# Patient Record
Sex: Male | Born: 1940 | Race: Black or African American | Hispanic: No | Marital: Married | Smoking: Former smoker
Health system: Southern US, Community
[De-identification: ages and names within clinical notes are randomized; demographics above are authoritative.]

## PROBLEM LIST (undated history)

## (undated) DIAGNOSIS — R059 Cough, unspecified: Secondary | ICD-10-CM

## (undated) DIAGNOSIS — N186 End stage renal disease: Secondary | ICD-10-CM

## (undated) DIAGNOSIS — I2510 Atherosclerotic heart disease of native coronary artery without angina pectoris: Secondary | ICD-10-CM

## (undated) DIAGNOSIS — R0602 Shortness of breath: Secondary | ICD-10-CM

## (undated) DIAGNOSIS — R42 Dizziness and giddiness: Secondary | ICD-10-CM

## (undated) DIAGNOSIS — R195 Other fecal abnormalities: Secondary | ICD-10-CM

## (undated) DIAGNOSIS — D649 Anemia, unspecified: Secondary | ICD-10-CM

## (undated) DIAGNOSIS — K2970 Gastritis, unspecified, without bleeding: Secondary | ICD-10-CM

## (undated) DIAGNOSIS — R05 Cough: Secondary | ICD-10-CM

## (undated) DIAGNOSIS — R531 Weakness: Secondary | ICD-10-CM

## (undated) DIAGNOSIS — G4700 Insomnia, unspecified: Secondary | ICD-10-CM

## (undated) DIAGNOSIS — I10 Essential (primary) hypertension: Secondary | ICD-10-CM

## (undated) DIAGNOSIS — I219 Acute myocardial infarction, unspecified: Secondary | ICD-10-CM

---

## 1998-03-31 ENCOUNTER — Other Ambulatory Visit: Admission: RE | Admit: 1998-03-31 | Discharge: 1998-03-31 | Payer: Self-pay | Admitting: *Deleted

## 1998-04-16 ENCOUNTER — Other Ambulatory Visit: Admission: RE | Admit: 1998-04-16 | Discharge: 1998-04-16 | Payer: Self-pay | Admitting: *Deleted

## 1998-05-07 ENCOUNTER — Other Ambulatory Visit: Admission: RE | Admit: 1998-05-07 | Discharge: 1998-05-07 | Payer: Self-pay | Admitting: *Deleted

## 1998-05-07 ENCOUNTER — Inpatient Hospital Stay (HOSPITAL_COMMUNITY): Admission: EM | Admit: 1998-05-07 | Discharge: 1998-05-13 | Payer: Self-pay | Admitting: Emergency Medicine

## 1998-05-25 ENCOUNTER — Inpatient Hospital Stay (HOSPITAL_COMMUNITY): Admission: EM | Admit: 1998-05-25 | Discharge: 1998-06-08 | Payer: Self-pay | Admitting: Emergency Medicine

## 1998-06-13 ENCOUNTER — Other Ambulatory Visit: Admission: RE | Admit: 1998-06-13 | Discharge: 1998-06-13 | Payer: Self-pay | Admitting: *Deleted

## 1998-09-04 ENCOUNTER — Ambulatory Visit (HOSPITAL_COMMUNITY): Admission: RE | Admit: 1998-09-04 | Discharge: 1998-09-04 | Payer: Self-pay | Admitting: Vascular Surgery

## 1999-02-05 ENCOUNTER — Ambulatory Visit (HOSPITAL_COMMUNITY): Admission: RE | Admit: 1999-02-05 | Discharge: 1999-02-05 | Payer: Self-pay | Admitting: Vascular Surgery

## 1999-03-24 ENCOUNTER — Ambulatory Visit (HOSPITAL_COMMUNITY): Admission: RE | Admit: 1999-03-24 | Discharge: 1999-03-24 | Payer: Self-pay | Admitting: *Deleted

## 1999-06-12 ENCOUNTER — Ambulatory Visit (HOSPITAL_COMMUNITY): Admission: RE | Admit: 1999-06-12 | Discharge: 1999-06-12 | Payer: Self-pay | Admitting: Thoracic Surgery

## 1999-06-13 ENCOUNTER — Inpatient Hospital Stay (HOSPITAL_COMMUNITY): Admission: AD | Admit: 1999-06-13 | Discharge: 1999-06-13 | Payer: Self-pay | Admitting: Thoracic Surgery

## 1999-06-13 ENCOUNTER — Encounter: Payer: Self-pay | Admitting: Thoracic Surgery

## 1999-08-18 ENCOUNTER — Encounter: Payer: Self-pay | Admitting: Vascular Surgery

## 1999-08-18 ENCOUNTER — Ambulatory Visit (HOSPITAL_COMMUNITY): Admission: RE | Admit: 1999-08-18 | Discharge: 1999-08-18 | Payer: Self-pay | Admitting: Vascular Surgery

## 2000-01-14 ENCOUNTER — Encounter: Admission: RE | Admit: 2000-01-14 | Discharge: 2000-02-29 | Payer: Self-pay | Admitting: Sports Medicine

## 2001-07-11 ENCOUNTER — Ambulatory Visit (HOSPITAL_COMMUNITY): Admission: RE | Admit: 2001-07-11 | Discharge: 2001-07-12 | Payer: Self-pay | Admitting: Ophthalmology

## 2001-07-11 ENCOUNTER — Encounter: Payer: Self-pay | Admitting: Ophthalmology

## 2001-08-12 ENCOUNTER — Ambulatory Visit (HOSPITAL_COMMUNITY): Admission: RE | Admit: 2001-08-12 | Discharge: 2001-08-12 | Payer: Self-pay | Admitting: *Deleted

## 2001-08-24 ENCOUNTER — Ambulatory Visit (HOSPITAL_COMMUNITY): Admission: RE | Admit: 2001-08-24 | Discharge: 2001-08-24 | Payer: Self-pay | Admitting: *Deleted

## 2001-08-24 ENCOUNTER — Encounter: Payer: Self-pay | Admitting: Vascular Surgery

## 2002-09-11 ENCOUNTER — Encounter: Admission: RE | Admit: 2002-09-11 | Discharge: 2002-09-11 | Payer: Self-pay | Admitting: Nephrology

## 2002-09-11 ENCOUNTER — Encounter: Payer: Self-pay | Admitting: Nephrology

## 2002-11-27 ENCOUNTER — Encounter: Payer: Self-pay | Admitting: Orthopaedic Surgery

## 2002-11-27 ENCOUNTER — Encounter: Admission: RE | Admit: 2002-11-27 | Discharge: 2002-11-27 | Payer: Self-pay | Admitting: Orthopaedic Surgery

## 2002-12-11 ENCOUNTER — Encounter: Admission: RE | Admit: 2002-12-11 | Discharge: 2002-12-11 | Payer: Self-pay | Admitting: Orthopaedic Surgery

## 2002-12-11 ENCOUNTER — Encounter: Payer: Self-pay | Admitting: Orthopaedic Surgery

## 2003-04-13 ENCOUNTER — Ambulatory Visit (HOSPITAL_COMMUNITY): Admission: AD | Admit: 2003-04-13 | Discharge: 2003-04-13 | Payer: Self-pay | Admitting: Vascular Surgery

## 2003-04-13 ENCOUNTER — Encounter: Payer: Self-pay | Admitting: Vascular Surgery

## 2003-08-26 ENCOUNTER — Encounter: Payer: Self-pay | Admitting: Vascular Surgery

## 2003-08-26 ENCOUNTER — Ambulatory Visit (HOSPITAL_COMMUNITY): Admission: RE | Admit: 2003-08-26 | Discharge: 2003-08-26 | Payer: Self-pay | Admitting: Vascular Surgery

## 2003-10-17 ENCOUNTER — Ambulatory Visit (HOSPITAL_COMMUNITY): Admission: RE | Admit: 2003-10-17 | Discharge: 2003-10-17 | Payer: Self-pay | Admitting: Nephrology

## 2004-01-26 ENCOUNTER — Emergency Department (HOSPITAL_COMMUNITY): Admission: EM | Admit: 2004-01-26 | Discharge: 2004-01-26 | Payer: Self-pay | Admitting: Emergency Medicine

## 2004-02-06 ENCOUNTER — Ambulatory Visit (HOSPITAL_COMMUNITY): Admission: RE | Admit: 2004-02-06 | Discharge: 2004-02-06 | Payer: Self-pay | Admitting: Nephrology

## 2005-03-11 ENCOUNTER — Ambulatory Visit: Payer: Self-pay | Admitting: Cardiology

## 2006-10-19 ENCOUNTER — Encounter: Admission: RE | Admit: 2006-10-19 | Discharge: 2006-10-19 | Payer: Self-pay | Admitting: Nephrology

## 2007-09-01 ENCOUNTER — Emergency Department (HOSPITAL_COMMUNITY): Admission: EM | Admit: 2007-09-01 | Discharge: 2007-09-01 | Payer: Self-pay | Admitting: Emergency Medicine

## 2008-02-27 ENCOUNTER — Emergency Department (HOSPITAL_COMMUNITY): Admission: EM | Admit: 2008-02-27 | Discharge: 2008-02-28 | Payer: Self-pay | Admitting: Family Medicine

## 2009-01-07 ENCOUNTER — Ambulatory Visit (HOSPITAL_COMMUNITY): Admission: RE | Admit: 2009-01-07 | Discharge: 2009-01-07 | Payer: Self-pay | Admitting: Nephrology

## 2009-04-11 ENCOUNTER — Ambulatory Visit (HOSPITAL_COMMUNITY): Admission: RE | Admit: 2009-04-11 | Discharge: 2009-04-11 | Payer: Self-pay | Admitting: Nephrology

## 2009-04-28 ENCOUNTER — Ambulatory Visit (HOSPITAL_COMMUNITY): Admission: RE | Admit: 2009-04-28 | Discharge: 2009-04-28 | Payer: Self-pay | Admitting: Nephrology

## 2009-08-11 ENCOUNTER — Ambulatory Visit (HOSPITAL_COMMUNITY): Admission: RE | Admit: 2009-08-11 | Discharge: 2009-08-11 | Payer: Self-pay | Admitting: Nephrology

## 2009-11-18 ENCOUNTER — Ambulatory Visit (HOSPITAL_COMMUNITY): Admission: RE | Admit: 2009-11-18 | Discharge: 2009-11-18 | Payer: Self-pay | Admitting: Nephrology

## 2010-02-09 ENCOUNTER — Ambulatory Visit (HOSPITAL_COMMUNITY): Admission: RE | Admit: 2010-02-09 | Discharge: 2010-02-09 | Payer: Self-pay | Admitting: Nephrology

## 2010-02-11 ENCOUNTER — Ambulatory Visit (HOSPITAL_COMMUNITY): Admission: RE | Admit: 2010-02-11 | Discharge: 2010-02-11 | Payer: Self-pay | Admitting: Vascular Surgery

## 2010-02-11 ENCOUNTER — Ambulatory Visit: Payer: Self-pay | Admitting: Vascular Surgery

## 2010-03-26 ENCOUNTER — Ambulatory Visit (HOSPITAL_COMMUNITY): Admission: RE | Admit: 2010-03-26 | Discharge: 2010-03-26 | Payer: Self-pay

## 2010-03-26 ENCOUNTER — Ambulatory Visit: Payer: Self-pay | Admitting: Surgery

## 2010-07-21 ENCOUNTER — Inpatient Hospital Stay (HOSPITAL_COMMUNITY): Admission: EM | Admit: 2010-07-21 | Discharge: 2010-07-24 | Payer: Self-pay | Admitting: *Deleted

## 2010-07-23 ENCOUNTER — Ambulatory Visit: Payer: Self-pay | Admitting: Surgery

## 2010-07-23 ENCOUNTER — Encounter (INDEPENDENT_AMBULATORY_CARE_PROVIDER_SITE_OTHER): Payer: Self-pay | Admitting: Internal Medicine

## 2010-07-24 ENCOUNTER — Ambulatory Visit: Payer: Self-pay | Admitting: Surgery

## 2011-01-17 ENCOUNTER — Encounter: Payer: Self-pay | Admitting: Nephrology

## 2011-03-13 LAB — GLUCOSE, CAPILLARY
Glucose-Capillary: 122 mg/dL — ABNORMAL HIGH (ref 70–99)
Glucose-Capillary: 81 mg/dL (ref 70–99)

## 2011-03-13 LAB — CBC
HCT: 31.9 % — ABNORMAL LOW (ref 39.0–52.0)
Hemoglobin: 10.5 g/dL — ABNORMAL LOW (ref 13.0–17.0)
Hemoglobin: 11 g/dL — ABNORMAL LOW (ref 13.0–17.0)
Hemoglobin: 11.9 g/dL — ABNORMAL LOW (ref 13.0–17.0)
MCH: 29 pg (ref 26.0–34.0)
MCHC: 32.8 g/dL (ref 30.0–36.0)
MCV: 88.4 fL (ref 78.0–100.0)
Platelets: 97 10*3/uL — ABNORMAL LOW (ref 150–400)
RBC: 3.81 MIL/uL — ABNORMAL LOW (ref 4.22–5.81)
RBC: 4.13 MIL/uL — ABNORMAL LOW (ref 4.22–5.81)
WBC: 6.7 10*3/uL (ref 4.0–10.5)
WBC: 7.8 10*3/uL (ref 4.0–10.5)

## 2011-03-13 LAB — COMPREHENSIVE METABOLIC PANEL
ALT: 48 U/L (ref 0–53)
AST: 89 U/L — ABNORMAL HIGH (ref 0–37)
CO2: 29 mEq/L (ref 19–32)
Chloride: 94 mEq/L — ABNORMAL LOW (ref 96–112)
GFR calc Af Amer: 7 mL/min — ABNORMAL LOW (ref >60)
GFR calc non Af Amer: 6 mL/min — ABNORMAL LOW (ref >60)
Sodium: 136 mEq/L (ref 135–145)
Total Bilirubin: 1.4 mg/dL — ABNORMAL HIGH (ref 0.3–1.2)

## 2011-03-13 LAB — T4, FREE: Free T4: 0.97 ng/dL (ref 0.80–1.80)

## 2011-03-13 LAB — BASIC METABOLIC PANEL
CO2: 29 mEq/L (ref 19–32)
Calcium: 8.4 mg/dL (ref 8.4–10.5)
Glucose, Bld: 128 mg/dL — ABNORMAL HIGH (ref 70–99)
Potassium: 3.7 mEq/L (ref 3.5–5.1)
Sodium: 135 mEq/L (ref 135–145)

## 2011-03-13 LAB — CULTURE, BLOOD (ROUTINE X 2)

## 2011-03-13 LAB — DIFFERENTIAL
Eosinophils Relative: 4 % (ref 0–5)
Lymphocytes Relative: 12 % (ref 12–46)
Lymphs Abs: 0.9 10*3/uL (ref 0.7–4.0)

## 2011-03-13 LAB — LACTIC ACID, PLASMA: Lactic Acid, Venous: 1.2 mmol/L (ref 0.5–2.2)

## 2011-03-13 LAB — POCT I-STAT, CHEM 8
BUN: 22 mg/dL (ref 6–23)
Creatinine, Ser: 8.1 mg/dL — ABNORMAL HIGH (ref 0.4–1.5)
Potassium: 3.2 mEq/L — ABNORMAL LOW (ref 3.5–5.1)
Sodium: 136 mEq/L (ref 135–145)
TCO2: 30 mmol/L (ref 0–100)

## 2011-03-19 LAB — POCT I-STAT 4, (NA,K, GLUC, HGB,HCT)
Glucose, Bld: 85 mg/dL (ref 70–99)
HCT: 36 % — ABNORMAL LOW (ref 39.0–52.0)
Hemoglobin: 12.2 g/dL — ABNORMAL LOW (ref 13.0–17.0)

## 2011-04-06 LAB — POTASSIUM: Potassium: 3.6 mEq/L (ref 3.5–5.1)

## 2011-04-07 LAB — POTASSIUM: Potassium: 3.8 mEq/L (ref 3.5–5.1)

## 2011-05-07 ENCOUNTER — Emergency Department (HOSPITAL_COMMUNITY): Payer: Medicare Other

## 2011-05-07 ENCOUNTER — Inpatient Hospital Stay (HOSPITAL_COMMUNITY): Payer: Medicare Other

## 2011-05-07 ENCOUNTER — Inpatient Hospital Stay (HOSPITAL_COMMUNITY)
Admission: EM | Admit: 2011-05-07 | Discharge: 2011-05-10 | DRG: 864 | Disposition: A | Discharge status: Home / Self Care | Payer: Medicare Other | Attending: Internal Medicine | Admitting: Internal Medicine

## 2011-05-07 DIAGNOSIS — D649 Anemia, unspecified: Secondary | ICD-10-CM | POA: Diagnosis present

## 2011-05-07 DIAGNOSIS — I120 Hypertensive chronic kidney disease with stage 5 chronic kidney disease or end stage renal disease: Secondary | ICD-10-CM | POA: Diagnosis present

## 2011-05-07 DIAGNOSIS — N186 End stage renal disease: Secondary | ICD-10-CM | POA: Diagnosis present

## 2011-05-07 DIAGNOSIS — R509 Fever, unspecified: Principal | ICD-10-CM | POA: Diagnosis present

## 2011-05-07 DIAGNOSIS — I2510 Atherosclerotic heart disease of native coronary artery without angina pectoris: Secondary | ICD-10-CM | POA: Diagnosis present

## 2011-05-07 DIAGNOSIS — N2581 Secondary hyperparathyroidism of renal origin: Secondary | ICD-10-CM | POA: Diagnosis present

## 2011-05-07 DIAGNOSIS — Z7982 Long term (current) use of aspirin: Secondary | ICD-10-CM

## 2011-05-07 DIAGNOSIS — N390 Urinary tract infection, site not specified: Secondary | ICD-10-CM | POA: Diagnosis present

## 2011-05-07 LAB — COMPREHENSIVE METABOLIC PANEL
Alkaline Phosphatase: 79 U/L (ref 39–117)
BUN: 45 mg/dL — ABNORMAL HIGH (ref 6–23)
GFR calc non Af Amer: 5 mL/min — ABNORMAL LOW (ref >60)
Glucose, Bld: 87 mg/dL (ref 70–99)
Potassium: 4.3 mEq/L (ref 3.5–5.1)
Total Protein: 7.7 g/dL (ref 6.0–8.3)

## 2011-05-07 LAB — DIFFERENTIAL
Eosinophils Relative: 0 % (ref 0–5)
Lymphocytes Relative: 14 % (ref 12–46)
Lymphs Abs: 1.1 10*3/uL (ref 0.7–4.0)
Monocytes Absolute: 0.7 10*3/uL (ref 0.1–1.0)

## 2011-05-07 LAB — CBC
HCT: 39.5 % (ref 39.0–52.0)
MCHC: 32.7 g/dL (ref 30.0–36.0)
MCV: 91 fL (ref 78.0–100.0)
RDW: 13.8 % (ref 11.5–15.5)
WBC: 8.2 10*3/uL (ref 4.0–10.5)

## 2011-05-07 LAB — TSH: TSH: 2.395 u[IU]/mL (ref 0.350–4.500)

## 2011-05-07 LAB — CK TOTAL AND CKMB (NOT AT ARMC): CK, MB: 1.9 ng/mL (ref 0.3–4.0)

## 2011-05-07 LAB — LIPASE, BLOOD: Lipase: 14 U/L (ref 11–59)

## 2011-05-07 LAB — PROTIME-INR: Prothrombin Time: 14.8 seconds (ref 11.6–15.2)

## 2011-05-08 LAB — COMPREHENSIVE METABOLIC PANEL
Albumin: 3 g/dL — ABNORMAL LOW (ref 3.5–5.2)
Albumin: 3 g/dL — ABNORMAL LOW (ref 3.5–5.2)
Alkaline Phosphatase: 68 U/L (ref 39–117)
BUN: 17 mg/dL (ref 6–23)
BUN: 22 mg/dL (ref 6–23)
Calcium: 9.2 mg/dL (ref 8.4–10.5)
Calcium: 9.2 mg/dL (ref 8.4–10.5)
Creatinine, Ser: 5.13 mg/dL — ABNORMAL HIGH (ref 0.4–1.5)
Glucose, Bld: 117 mg/dL — ABNORMAL HIGH (ref 70–99)
Potassium: 3.9 mEq/L (ref 3.5–5.1)
Sodium: 136 mEq/L (ref 135–145)
Total Protein: 7.1 g/dL (ref 6.0–8.3)
Total Protein: 6.9 g/dL (ref 6.0–8.3)

## 2011-05-08 LAB — CBC
HCT: 38.5 % — ABNORMAL LOW (ref 39.0–52.0)
HCT: 36.2 % — ABNORMAL LOW (ref 39.0–52.0)
MCH: 29.1 pg (ref 26.0–34.0)
MCHC: 32.5 g/dL (ref 30.0–36.0)
MCV: 89.5 fL (ref 78.0–100.0)
MCV: 89.8 fL (ref 78.0–100.0)
Platelets: 110 10*3/uL — ABNORMAL LOW (ref 150–400)
RBC: 4.03 MIL/uL — ABNORMAL LOW (ref 4.22–5.81)
RDW: 13.7 % (ref 11.5–15.5)
WBC: 6.1 10*3/uL (ref 4.0–10.5)

## 2011-05-08 LAB — LIPID PANEL
Cholesterol: 140 mg/dL (ref 0–200)
HDL: 44 mg/dL (ref >39)
Total CHOL/HDL Ratio: 3.2 RATIO
Triglycerides: 134 mg/dL (ref <150)
VLDL: 27 mg/dL (ref 0–40)

## 2011-05-08 LAB — URINALYSIS, MICROSCOPIC ONLY
Glucose, UA: NEGATIVE mg/dL
Ketones, ur: 15 mg/dL — AB
Nitrite: NEGATIVE
Protein, ur: >300 mg/dL — AB
Urobilinogen, UA: 0.2 mg/dL (ref 0.0–1.0)

## 2011-05-08 LAB — GLUCOSE, CAPILLARY
Glucose-Capillary: 79 mg/dL (ref 70–99)
Glucose-Capillary: 90 mg/dL (ref 70–99)
Glucose-Capillary: 100 mg/dL — ABNORMAL HIGH (ref 70–99)
Glucose-Capillary: 109 mg/dL — ABNORMAL HIGH (ref 70–99)

## 2011-05-08 LAB — CARDIAC PANEL(CRET KIN+CKTOT+MB+TROPI)
CK, MB: 1.8 ng/mL (ref 0.3–4.0)
Relative Index: 0.2 (ref 0.0–2.5)
Total CK: 871 U/L — ABNORMAL HIGH (ref 7–232)
Total CK: 824 U/L — ABNORMAL HIGH (ref 7–232)

## 2011-05-08 LAB — MRSA PCR SCREENING: MRSA by PCR: NEGATIVE

## 2011-05-08 NOTE — H&P (Signed)
Ronald Brewer, Ronald Brewer                 ACCOUNT NO.:  [id number]  MEDICAL RECORD NO.:  [id number]           PATIENT TYPE:  E  LOCATION:  [facility]                         FACILITY:  [facility]  PHYSICIAN:  Marinda Elk, M.D.DATE OF BIRTH:  [birth year]  DATE OF ADMISSION:  05/07/2011 DATE OF DISCHARGE:                             HISTORY & PHYSICAL   PRIMARY CARE DOCTOR:  James L. Deterding, MD  VASCULAR SURGEON: 1. Durene Cal IV, MD  CHIEF COMPLAINT:  Nausea, vomiting and fevers.  This is a 70 year old male with past medical history of end-stage renal disease, currently on dialysis Monday, Wednesday, and Friday with also past medical history on July 25, 2010 of MSSA bacteremia probable grafts source.  That comes in for fever and malaise.  The patient has been complaining of nausea and vomiting for the past 2 days.  As per his wife, he has no appetite, has not eaten anything, only drank minimally, has not taking his blood pressure medication.  Stated that yesterday he fell on his hip, this was secondarly felling a little dizzy at that time. Denies any chest pain, back pain, urinary symptoms, hematemesis.  He relates some fevers and chills and has been progressively getting worse. He relays he does not have an appetite and continues to have this bad feeling all over his body. Has vomitted several times.  ALLERGIES:  No known drug allergies.  PAST MEDICAL HISTORY: 1. MSSA bacteremia. 2. End-stage renal disease. 3. Status post left subclavian vein angioplasty done in July 2011. 4. Hypertension.  MEDICATIONS: 1. Amlodipine 10 mg p.o. daily. 2. Aspirin 81 mg daily. 3. Cefazolin 1 g Monday, Wednesday and Friday. 4. Micardis 80 mg daily. 5. PhosLo 667 mg p.o. t.i.d.  SOCIAL HISTORY:  He lives with his wife at home in [place], retired. Denies alcohol, tobacco or drugs.  FAMILY HISTORY:  Mother had hypertension.  His father is unknown.  REVIEW OF SYSTEMS:  Ten-point review of  system done, pertinent positive per HPI.  PHYSICAL EXAMINATION:  VITAL SIGNS:  Temperature 100.8, pulse of 84, blood pressure 106/60, heart breathing 27 per minute, satting 97% on room air. GENERAL:  He is lying in bed, looks tired appearing. HEENT:  Dry mucous membrane.  No JVD.  No bruits.  No thyromegaly. Anicteric.  No pallor. CARDIOVASCULAR:  He has a regular rate and rhythm with a positive S1 and S2.  No murmurs, rubs, or gallops. LUNGS:  He has good air movement, clear to auscultation. ABDOMEN:  He has positive bowel sounds, suprapubic tenderness, nondistended.  No rebound. SKIN: No rashes or ulceration. NECK:  No nephropathy, nonpalpable. NEUROLOGIC:  Nonfocal.  Laboratory data on admission shows a lipase of 14.  His sodium is 138, potassium 4.3, chloride 95, bicarb of 30, glucose of 87, BUN of 45, creatinine 10.50, bilirubin 0.4, alkaline phosphatase 79, AST 18, ALT 8, total protein 7.7, albumin 3.4, calcium of 9.9.  His white count of 8.2, ANC of 6.2, hemoglobin of 12.9 with a platelet count of 132, MCV of 91. His chest x-ray shows no acute cardiopulmonary disease.  EKG shows left axis deviation.  No T-wave inversions with a normal sinus rhythm.  ASSESSMENT AND PLAN: 1. Fever, malaise, nausea and vomiting, anorexia, concern for bacteremia.     He does have a history for this last year.  In July 2011, he had an     MSSA bacteremia.  At this time, we will him admit to step-down due     to his blood pressure being soft.  We will get blood cultures x2.     We will start him on vancomycin and Zosyn.  Have already consulted     Renal.  Once blood cultures back, we will start him with broad-     spectrum antibiotics. Concern for graft infection. Also due to his      history of HTN and smoking will cycle his cardiac markers. 2. End-stage renal disease per Renal Monday, Wednesday, and Friday. 3. Anemia per Renal. 4. Hypertension.  We will hold all his blood pressure medications.   He     usually runs high, his blood pressure is borderline.  At this time,     he is not taking his blood pressure medication for 2 days.  I will     give IV fluids, admit him to the step-down and monitor him closely.     Marinda Elk, M.D.     AF/MEDQ  D:  05/07/2011  T:  05/07/2011  Job:  [id number]  cc:   Mindi Slicker. Lowell Guitar, M.D.  Electronically Signed by Marinda Elk M.D. on 05/08/2011 07:09:26 AM

## 2011-05-09 LAB — CBC
HCT: 34.8 % — ABNORMAL LOW (ref 39.0–52.0)
Hemoglobin: 11.4 g/dL — ABNORMAL LOW (ref 13.0–17.0)
MCV: 89.5 fL (ref 78.0–100.0)
RBC: 3.89 MIL/uL — ABNORMAL LOW (ref 4.22–5.81)
RDW: 13.7 % (ref 11.5–15.5)
WBC: 5.2 10*3/uL (ref 4.0–10.5)

## 2011-05-09 LAB — GLUCOSE, CAPILLARY
Glucose-Capillary: 85 mg/dL (ref 70–99)
Glucose-Capillary: 105 mg/dL — ABNORMAL HIGH (ref 70–99)
Glucose-Capillary: 107 mg/dL — ABNORMAL HIGH (ref 70–99)

## 2011-05-09 LAB — RENAL FUNCTION PANEL
BUN: 37 mg/dL — ABNORMAL HIGH (ref 6–23)
Chloride: 91 mEq/L — ABNORMAL LOW (ref 96–112)
Glucose, Bld: 81 mg/dL (ref 70–99)
Phosphorus: 2.9 mg/dL (ref 2.3–4.6)
Potassium: 4.1 mEq/L (ref 3.5–5.1)
Sodium: 132 mEq/L — ABNORMAL LOW (ref 135–145)

## 2011-05-10 ENCOUNTER — Inpatient Hospital Stay (HOSPITAL_COMMUNITY): Payer: Medicare Other

## 2011-05-10 LAB — GLUCOSE, CAPILLARY: Glucose-Capillary: 64 mg/dL — ABNORMAL LOW (ref 70–99)

## 2011-05-10 LAB — URINE CULTURE
Colony Count: 10000
Culture  Setup Time: 201205130021

## 2011-05-11 NOTE — Discharge Summary (Signed)
  NAMEALASTAIR, Ronald Brewer                 ACCOUNT NO.:  [id number]  MEDICAL RECORD NO.:  [id number]           PATIENT TYPE:  I  LOCATION:  6715                         FACILITY:  [facility]  PHYSICIAN:  Jonny Ruiz, MD    DATE OF BIRTH:  12/30/1940  DATE OF ADMISSION:  05/07/2011 DATE OF DISCHARGE:                        DISCHARGE SUMMARY - REFERRING   PRIMARY CARE PHYSICIAN:  Dr. Fayrene Fearing Deterding.  VASCULAR SURGEON: 1. Durene Cal IV, MD.  REASON FOR HOSPITALIZATION:  Nausea and vomiting and fever.  LAB DATA:  Cardiac enzymes negative.  CBC with WBC 8.2, hemoglobin 12.9, hematocrit 39.5, platelet count 132.  Comprehensive metabolic panel 138, potassium 4.3, chloride 95, CO2 30, glucose 87, BUN 45, creatinine 10.50.  Liver enzymes normal.  Calcium 9.9 repeated.  Renal function 1 day prior to discharge, BUN 37, creatinine 8.0.  The patient underwent hemodialysis today.  Urinalysis showed large blood with greater than 300 protein and 11-20 wbc's with unremarkable urine culture.  Repeated cardiac enzymes showed a total CK of 824 with normal CK-MB and troponin. Blood cultures were negative x2.  Lipid profile, total cholesterol 140, triglycerides 134, HDL 44, LDL 69.  MRSA PCR screening was negative. TSH 2.395, magnesium 2.3, lipase 14.  FINAL DISCHARGE DIAGNOSIS:  Viral febrile illness.  SECONDARY DIAGNOSES:  End-stage renal disease, on hemodialysis, status post left subclavian angioplasty in July 2011, hypertension, and MSSA bacteremia.  Patient underwent chest x-ray which was normal.  Hip x-ray which was normal.  Abdominal 2-view x-ray, no acute abnormality.  PROCEDURES AND TREATMENT:  The patient was admitted to the hospital on May 06, 2010, and was initially managed with medications for nausea and vomiting including Zofran.  He wants empirically started on vanc and Zosyn.  And after consultation with Nephrology, the patient was discontinued on antibiotics and he was observed.  The  patient's temperature normalized and he became afebrile without nausea and vomiting.  Today, the patient underwent hemodialysis and he was only noted to have elevated blood pressure which responded to amlodipine 5 mg a day.  Because of abnormal UA, the patient will be discharged home on ciprofloxacin 500 mg every 24 hours on 5 days.  DISCHARGE CONDITION:  Stable.  INSTRUCTIONS:  The patient will be discharged on his previous home regimen.  His diet is a renal diet.  Physical activity without restrictions.  MEDICATIONS: 1. Amlodipine 10 mg a day. 2. Aspirin 81 mg a day. 3. Cefazolin 1 g on Monday, Wednesday, and Friday. 4. Micardis 80 mg a day. 5. PhosLo 667 mg p.o. 3 times a day.  FOLLOW-UP:  Follow up with Dr. Darrick Penna in 2 weeks.          ______________________________ Jonny Ruiz, MD     GL/MEDQ  D:  05/10/2011  T:  05/10/2011  Job:  [id number]  Electronically Signed by Jonny Ruiz MD on 05/11/2011 02:45:24 PM

## 2011-05-13 LAB — CULTURE, BLOOD (ROUTINE X 2)
Culture  Setup Time: 201205111831
Culture: NO GROWTH

## 2011-05-14 NOTE — Op Note (Signed)
[place]  Patient:    Ronald Brewer, Ronald Brewer                          MRN: [id number] Proc. Date: 08/12/01 Adm. Date:  [id number] Disc. Date: [id number] Attending:  Caralee Ates.                           Operative Report  PREOPERATIVE DIAGNOSIS:  End-stage renal disease with thrombosed left forearm arteriovenous graft.  POSTOPERATIVE DIAGNOSIS:  End-stage renal disease with thrombosed left forearm arteriovenous graft.  PROCEDURE:  Thrombectomy of left forearm arteriovenous graft.  SURGEON:  Caralee Ates, M.D.  ANESTHESIA:  MAC plus 1% lidocaine with local.  ESTIMATED BLOOD LOSS:  25 cc.  DRAINS:  None.  SPECIMENS:  None.  COMPLICATIONS:  None.  BRIEF HISTORY:  This patient is a 70 year old black gentleman with end-stage renal disease, who is on hemodialysis through a left forearm AV graft.  This graft has thrombosed previously and has since been revised to the brachial vein above the level of the elbow.  He thrombosed today and is in need of thrombectomy or possible revision for his dialysis needs.  The risks, benefits, and alternatives to the procedure were explained in detail to the patient, and he agreed to proceed.  DESCRIPTION OF PROCEDURE:  The patient was brought in the operating room and placed on the operating table in supine position with the left arm extended on an arm board.  Following adequate IV sedation, the left arm was draped and prepped circumferentially from the axilla to the hand.  A longitudinal incision was made following local anesthesia with 1% lidocaine on the medial aspect of the left upper arm through a previous incision.  The wound was deepened using the Bovie to control bleeding.  The venous limb of the access graft was identified at this level and dissected circumferentially and controlled with a vessel loop.  The outflow tract was further dissected to the level of the brachial vein, where the brachial vein was  dissected circumferentially and controlled with a vessel loop.  The patient was systemically heparinized.  Following adequate three-minute circulation time, a transverse graftotomy was made in the distalmost portion of the graft, and the #4 Fogarty catheter was passed out the venous outflow tract and thrombectomized with the return of fresh thrombus.  There was minimal resistance to the balloon during thrombectomy, indicating there was not a significant stenosis at this level.  Serial dilatation with coronary dilators up to a #5 coronary dilator was performed without significant resistance to passage of the dilator.  At this point we felt there was not a significant outflow stenosis.  The vein was then backbled and then flushed with heparin and saline solution easily.  The venous outflow tract was then occluded.  The arterial limb was then thrombectomized with the return of excellent inflow following the removal of the fresh clot.  At this point the transverse graftotomy was closed with running 5-0 Prolene suture and the clamps were released, restoring flow.  There was excellent thrill in the graft at this point.  Hemostasis was achieved in the wound.  Then the wound was irrigated with warm sterile saline.  The wound was then closed in layers of 3-0 and 4-0 Vicryl suture.  Sterile dry dressings were applied.  The patient was then awakened from anesthesia and transferred to the recovery room in stable condition.  The patient tolerated the procedure well.  There were no complications.  All needle and sponge counts were correct. DD:  08/12/01 TD:  08/14/01 Job: [id number]

## 2011-05-14 NOTE — Op Note (Signed)
NAME:  Ronald Brewer, Ronald Brewer                           ACCOUNT NO.:  [id number]   MEDICAL RECORD NO.:  [id number]                   PATIENT TYPE:  OIB   LOCATION:  2899                                 FACILITY:  [facility]   PHYSICIAN:  Di Kindle. Edilia Bo, M.D.        DATE OF BIRTH:  1941-04-17   DATE OF PROCEDURE:  08/26/2003  DATE OF DISCHARGE:                                 OPERATIVE REPORT   PREOPERATIVE DIAGNOSIS:  Chronic renal failure with clotted left forearm  arteriovenous graft.   POSTOPERATIVE DIAGNOSIS:  Chronic renal failure with clotted left forearm  arteriovenous graft.   OPERATION/PROCEDURE:  1. Attempted thrombectomy of left forearm arteriovenous graft.  2. Insertion of new left upper arm arteriovenous graft.  3. Ultrasound of bilateral internal jugular veins.  4. Placement of a right IJ Diatek catheter.   SURGEON:  Di Kindle. Edilia Bo, M.D.   ASSISTANT:  Nurse.   ANESTHESIA:  Local with sedation.   TECHNIQUE:  The patient was taken to the operating room and was sedated by  anesthesia.  The left upper extremity was prepped and draped in the usual  sterile fashion.  The incision  over the previous venous anastomosis was  anesthetized and then opened and through dense scar tissue, I dissected out  the venous limb of the graft.  Of note, the graft was very degenerative and  the vein itself had become quite sclerotic.  This graft had been revised  multiple times and had been extended fairly high up the brachial vein.  I  therefore decided to abandon this graft and place a new upper arm graft.  The graft was oversewn at both ends.  Hemostasis was obtained in this wound.  A separate longitudinal incision was made about the antecubital space where  the brachial artery was dissected free.  This was a good sized artery with  an excellent pulse.  A separate longitudinal incision was made beneath the  axilla after the skin was anesthetized and the high brachial vein was  dissected free.  A 4-7 mm graft was then tunneled between the two incisions.  The patient was then heparinized.  The brachial artery was clamped  proximally and distally and a longitudinal arteriotomy was made.  A segment  of the 4 mm end of the graft was excised, the graft slightly spatulated and  sewn end-to-side to the brachial artery using continuous 6-0 Prolene suture.  The graft was then pulled to the appropriate length for an anastomosis to  the high brachial vein.  The vein was ligated distally and spatulated  proximally.  The graft was cut to the appropriate length spatulated and sewn  end-to-end to the vein using a continuous 6-0 Prolene suture.  At the  completion, there was an excellent thrill in the graft.  The heparin was  reversed with Protamine.  Hemostasis was obtained in the wounds.  The wounds  were closed with a deep layer  of 3-0 Vicryl, the skin closed with 4-0  Vicryl.  Sterile dressing was applied.   Next, attention was turned to placement of a Diatek catheter.  Both internal  jugular veins were identified with the ultrasound scanner and both appeared  to be patent.  The neck and upper chest were then prepped and draped in the  usual sterile fashion.  After the skin was anesthetized with 1% lidocaine,  the right internal jugular vein was cannulated and a guide wire introduced  into the superior vena cava under fluoroscopic control.  The tract was then  dilated and then a dilator and peel-away sheath were passed over the wire  and the wire and dilator removed.  The catheter was passed through the peel-  away sheath and positioned in the right atrium.  The exit site for the  catheter was selected and the skin anesthetized between the two areas.  The  catheter was then brought through the tunnel and cut to the appropriate  length and the distal ports were attached.  Both ports withdrew easily, were  then flushed with heparinized saline and filled with concentrated  heparin.  The catheter was secured at its exit site with a 3-0 nylon suture.  The IJ  cannulation site was closed with a 4-0 subcuticular stitch.  Sterile  dressing was applied.  The patient tolerated the procedure well, was  transferred to the recovery room in satisfactory condition.  All needle and  sponge counts were correct.                                                Di Kindle. Edilia Bo, M.D.    CSD/MEDQ  D:  08/26/2003  T:  08/26/2003  Job:  [id number]

## 2011-05-14 NOTE — Op Note (Signed)
[place]  Patient:    Ronald Brewer, Ronald Brewer Visit Number: [id number] MRN: [id number]          Service Type: DSU Location: [facility] 2876 [id number] Attending Physician:  Caralee Ates Dictated by:   Di Kindle Edilia Bo, M.D. Proc. Date: 08/24/01 Admit Date:  08/24/2001 Discharge Date: 08/24/2001                             Operative Report  PREOPERATIVE DIAGNOSES: 1. Chronic renal failure. 2. Clotted left forearm arteriovenous graft.  POSTOPERATIVE DIAGNOSES: 1. Chronic renal failure. 2. Clotted left forearm arteriovenous graft.  PROCEDURES: 1. Thrombectomy of left forearm arteriovenous graft. 2. Insertion of new segment of graft to more proximal brachial vein. 3. Intraoperative arteriogram x 2.  SURGEON:  Di Kindle. Edilia Bo, M.D.  ASSISTANT:  Nurse.  ANESTHESIA:  Local with sedation.  DESCRIPTION OF PROCEDURE:  The patient was taken to the operating room and sedated by anesthesia.  The left upper extremity was prepped and draped in the usual sterile fashion.  After the skin was infiltrated with 1% lidocaine, the incision over the venous anastomosis was opened and through dense scar tissue, the more proximal brachial vein was dissected free and the old venous anastomosis was excised.  The vein was large, although somewhat irregular.  I tried to get up high above the intimal hyperplasia.  Graft thrombectomy was performed using a #4 Fogarty catheter.  There were no problems in pulling the catheter through the body of the graft.  The arterial plug was retrieved and excellent inflow established.  The graft was flushed with heparinized saline and clamped.  A new segment of 7 mm graft was brought to the field and sewn end-to-end to the spatulated brachial vein using continuous 6-0 Prolene suture.  The graft was then pulled to the appropriate length for anastomosis to the old graft.  This was done end-to-end using continuous 6-0 Prolene suture.  At the  completion there was an excellent thrill in the graft.  Two intraoperative fistulograms were obtained, and no problems were identified. Hemostasis was obtained in the wounds.  The patients heparin was partially reversed with protamine.  The patient did require 20 mcg of DDAVP.  The wound was then closed with a deep layer of 3-0 Vicryl and the skin closed with 4-0 Vicryl.  A sterile dressing was applied.  The patient tolerated the procedure well and was transferred to the recovery room in satisfactory condition.  All needle and sponge counts were correct. Dictated by:   Di Kindle Edilia Bo, M.D. Attending Physician:  Caralee Ates. DD:  08/24/01 TD:  08/25/01 Job: [id number]

## 2011-05-14 NOTE — Op Note (Signed)
[place]. [facility]  Patient:    Ronald Brewer, Ronald Brewer                          MRN: [id number] Proc. Date: 07/11/01 Adm. Date:  [id number] Disc. Date: [id number] Attending:  Bertrum Sol                           Operative Report  DATE OF BIRTH:  [birth year].  ADMISSION DIAGNOSES:  Traction retinal detachment, vitreous hemorrhage, proliferative diabetic retinopathy, dense cataract, all in the left eye.  PROCEDURES:  Pars plana vitrectomy, membrane peel, repair of retinal detachment, retinal photocoagulation, and gas-fluid exchange, left eye.  SURGEON:  Beulah Gandy. Ashley Royalty, M.D.  ASSISTANT:  Lu Duffel, C.O.A., S.A.  ANESTHESIA:  General.  DESCRIPTION OF PROCEDURE:  Usual prep and drape.  Peritomies at 10, 2, and 4. A 4 mm angled infusion port anchored in place at 4 oclock.  Contact lens ring anchored in place at 6 and 12 oclock.  The lighted pick and the cutter were placed at 10 and 2 oclock, respectively.  The pars plana vitrectomy was begun just behind the dense cataract.  Large amounts of blood and vitreous were encountered.  These were carefully removed under low suction and rapid cutting.  Once the blood was cleared away, the disc became apparent and there was severe traction retinal detachment extending from the disc superiorly, inferiorly, nasally, and temporally.  The membrane was engaged with a lighted pick and freed from its attachment to the disc.  The membrane was peeled peripherally along the upper and lower arcades.  The MPC scissors were used to section different areas of the fibrovascular attachment.  The posterior hyaloid was removed for 360 degrees with the vitreous cutter.  The endocautery was used to cauterize several areas of vascular bleeding.  Once all of the areas of traction were relieved, the retina was allowed to lie flat.  Blood was vacuumed from the posterior segment of the eye with the [employer] brush.  The  Endolaser was positioned in the eye, and 944 burns were placed around the retinal periphery with a power of 400 milliwatts, 1000 microns each, and 0.1 seconds each.  A gas-fluid exchange was carried out.  The instruments were removed from the eye.  Nylon 9-0 was used to close the sclerotomy sites.  The conjunctiva was closed with wet-field cautery. Polymyxin and gentamicin were irrigated into Tenons space, atropine solution was applied, Decadron 10 mg was injected into the lower subconjunctival space. Marcaine was injected around the globe for postop pain.  Polysporin, a patch and shield were placed.  The patient was awakened and taken to recovery in a satisfactory condition. DD:  07/11/01 TD:  07/12/01 Job: [id number]

## 2011-05-14 NOTE — H&P (Signed)
   NAME:  Ronald Brewer, Ronald Brewer                          ACCOUNT NO.:  [id number]   MEDICAL RECORD NO.:  [id number]                   PATIENT TYPE:  INP   LOCATION:                                       FACILITY:  [facility]   PHYSICIAN:  Quita Skye. Hart Rochester, M.D.               DATE OF BIRTH:  07/06/1941   DATE OF ADMISSION:  04/13/2003  DATE OF DISCHARGE:                                HISTORY & PHYSICAL   PREOPERATIVE DIAGNOSIS:  Thrombosed AV cortex graft, left forearm.   POSTOPERATIVE DIAGNOSIS:  Thrombosed AV cortex graft, left forearm.   OPERATION:  Thrombectomy AV cortex graft, left forearm with insertion new  segment of graft, existing graft to brachial vein with 6 mm cortex.   SURGEON:  Quita Skye. Hart Rochester, M.D.   FIRST ASSISTANT:  Nurse.   ANESTHESIA:  Local.   PROCEDURE:  Patient was taken to the operating room, placed in a supine  position at which time, left upper extremity was prepped with Betadine scrub  and solution, draped in a routine sterile manner.  After infiltration with  1% Xylocaine with epinephrine, a longitudinal incision was made through the  distal left arm through the previous scar.  The cortex of the brachial vein  anastomosis is dissected free with a #15 blade, 3,000 units of heparin given  intravenously.  The graft was opened transversely and easily  thrombectomized.  __________ inflow being reestablished.  There was some  intimal hyperplasia involving the distal 2 cm of the graft.  The proximal  vein was 5 mm in size and a Fogarty would easily traverse it into the right  atrium.  There was some thrombus in the vein which was thrombectomized.  The  previous anastomosis was excised, a new piece of 6 mm graft anastomoses end-  to-end with the old graft and end-to-end with the vein with 6-0 Prolene.  Clamps were then released.  There was excellent pulse and thrill in the  graft.  Protamine 30 mg was given to reverse the heparin.  Following this,  the wound was closed  with Vicryl in a subcuticular fashion.  A sterile  dressing was applied.  Patient was taken to the recovery room in  satisfactory condition.                                               Quita Skye Hart Rochester, M.D.    JDL/MEDQ  D:  04/13/2003  T:  04/13/2003  Job:  [id number]

## 2011-09-06 ENCOUNTER — Other Ambulatory Visit (HOSPITAL_COMMUNITY): Payer: Self-pay | Admitting: Nephrology

## 2011-09-06 ENCOUNTER — Ambulatory Visit (HOSPITAL_COMMUNITY)
Admission: RE | Admit: 2011-09-06 | Discharge: 2011-09-06 | Disposition: A | Discharge status: Home / Self Care | Payer: Medicare Other | Source: Ambulatory Visit | Attending: Nephrology | Admitting: Nephrology

## 2011-09-06 DIAGNOSIS — M19019 Primary osteoarthritis, unspecified shoulder: Secondary | ICD-10-CM | POA: Insufficient documentation

## 2011-09-06 DIAGNOSIS — T82898A Other specified complication of vascular prosthetic devices, implants and grafts, initial encounter: Secondary | ICD-10-CM | POA: Insufficient documentation

## 2011-09-06 DIAGNOSIS — N186 End stage renal disease: Secondary | ICD-10-CM | POA: Insufficient documentation

## 2011-09-06 DIAGNOSIS — D649 Anemia, unspecified: Secondary | ICD-10-CM | POA: Insufficient documentation

## 2011-09-06 DIAGNOSIS — I120 Hypertensive chronic kidney disease with stage 5 chronic kidney disease or end stage renal disease: Secondary | ICD-10-CM | POA: Insufficient documentation

## 2011-09-06 DIAGNOSIS — I2510 Atherosclerotic heart disease of native coronary artery without angina pectoris: Secondary | ICD-10-CM | POA: Insufficient documentation

## 2011-09-06 DIAGNOSIS — I252 Old myocardial infarction: Secondary | ICD-10-CM | POA: Insufficient documentation

## 2011-09-06 DIAGNOSIS — Y832 Surgical operation with anastomosis, bypass or graft as the cause of abnormal reaction of the patient, or of later complication, without mention of misadventure at the time of the procedure: Secondary | ICD-10-CM | POA: Insufficient documentation

## 2011-09-06 DIAGNOSIS — I871 Compression of vein: Secondary | ICD-10-CM | POA: Insufficient documentation

## 2011-09-06 LAB — POTASSIUM: Potassium: 6.9 mEq/L (ref 3.5–5.1)

## 2011-09-06 LAB — POCT I-STAT 4, (NA,K, GLUC, HGB,HCT): HCT: 37 % — ABNORMAL LOW (ref 39.0–52.0)

## 2011-09-06 MED ORDER — IOHEXOL 300 MG/ML  SOLN
100.0000 mL | Freq: Once | INTRAMUSCULAR | Status: AC | PRN
  Administered 2011-09-06: 35 mL via INTRAVENOUS

## 2011-09-07 LAB — POCT I-STAT 4, (NA,K, GLUC, HGB,HCT)
HCT: 44 % (ref 39.0–52.0)
Hemoglobin: 15 g/dL (ref 13.0–17.0)
Potassium: 7.9 mEq/L (ref 3.5–5.1)
Sodium: 134 mEq/L — ABNORMAL LOW (ref 135–145)

## 2011-09-20 LAB — CBC
Hemoglobin: 11.9 — ABNORMAL LOW
MCHC: 32.6
MCV: 89.8
RBC: 4.08 — ABNORMAL LOW
WBC: 8.4

## 2011-09-20 LAB — DIFFERENTIAL
Basophils Absolute: 0.1
Basophils Relative: 1
Eosinophils Absolute: 1.4 — ABNORMAL HIGH
Eosinophils Relative: 16 — ABNORMAL HIGH
Lymphs Abs: 2.3
Neutrophils Relative %: 47

## 2011-09-20 LAB — COMPREHENSIVE METABOLIC PANEL
ALT: 8
AST: 14
CO2: 27
Calcium: 9.4
Chloride: 93 — ABNORMAL LOW
GFR calc non Af Amer: 8 — ABNORMAL LOW
Glucose, Bld: 75
Sodium: 134 — ABNORMAL LOW
Total Bilirubin: 0.9

## 2011-09-20 LAB — LIPASE, BLOOD: Lipase: 15

## 2012-06-26 ENCOUNTER — Observation Stay (HOSPITAL_COMMUNITY)
Admission: EM | Admit: 2012-06-26 | Discharge: 2012-06-26 | Disposition: A | Discharge status: Home / Self Care | Payer: Medicare Other | Attending: Emergency Medicine | Admitting: Emergency Medicine

## 2012-06-26 ENCOUNTER — Emergency Department (HOSPITAL_COMMUNITY): Payer: Medicare Other

## 2012-06-26 ENCOUNTER — Encounter (HOSPITAL_COMMUNITY): Payer: Self-pay | Admitting: Emergency Medicine

## 2012-06-26 DIAGNOSIS — Z992 Dependence on renal dialysis: Secondary | ICD-10-CM | POA: Insufficient documentation

## 2012-06-26 DIAGNOSIS — M25559 Pain in unspecified hip: Secondary | ICD-10-CM | POA: Insufficient documentation

## 2012-06-26 DIAGNOSIS — M545 Low back pain, unspecified: Principal | ICD-10-CM | POA: Insufficient documentation

## 2012-06-26 DIAGNOSIS — Y92009 Unspecified place in unspecified non-institutional (private) residence as the place of occurrence of the external cause: Secondary | ICD-10-CM | POA: Insufficient documentation

## 2012-06-26 DIAGNOSIS — N186 End stage renal disease: Secondary | ICD-10-CM | POA: Insufficient documentation

## 2012-06-26 DIAGNOSIS — W010XXA Fall on same level from slipping, tripping and stumbling without subsequent striking against object, initial encounter: Secondary | ICD-10-CM | POA: Insufficient documentation

## 2012-06-26 DIAGNOSIS — M549 Dorsalgia, unspecified: Secondary | ICD-10-CM

## 2012-06-26 DIAGNOSIS — I120 Hypertensive chronic kidney disease with stage 5 chronic kidney disease or end stage renal disease: Secondary | ICD-10-CM | POA: Insufficient documentation

## 2012-06-26 HISTORY — DX: Essential (primary) hypertension: I10

## 2012-06-26 MED ORDER — OXYCODONE-ACETAMINOPHEN 5-325 MG PO TABS
1.0000 | ORAL_TABLET | Freq: Four times a day (QID) | ORAL | Status: DC | PRN
  Administered 2012-06-26: 2 via ORAL
  Filled 2012-06-26: qty 2

## 2012-06-26 MED ORDER — CYCLOBENZAPRINE HCL 10 MG PO TABS: 5.0000 mg | ORAL_TABLET | Freq: Three times a day (TID) | ORAL | Status: DC | PRN

## 2012-06-26 MED ORDER — HYDROMORPHONE HCL PF 2 MG/ML IJ SOLN
1.0000 mg | Freq: Once | INTRAMUSCULAR | Status: AC
  Administered 2012-06-26: 1 mg via INTRAMUSCULAR
  Filled 2012-06-26: qty 1

## 2012-06-26 MED ORDER — HYDROMORPHONE HCL PF 1 MG/ML IJ SOLN: 1.0000 mg | INTRAMUSCULAR | Status: DC | PRN

## 2012-06-26 MED ORDER — OXYCODONE-ACETAMINOPHEN 5-325 MG PO TABS: 1.0000 | ORAL_TABLET | ORAL | Status: AC | PRN

## 2012-06-26 MED ORDER — METHOCARBAMOL 500 MG PO TABS
500.0000 mg | ORAL_TABLET | Freq: Once | ORAL | Status: AC
  Administered 2012-06-26: 500 mg via ORAL
  Filled 2012-06-26: qty 1

## 2012-06-26 MED ORDER — ONDANSETRON 4 MG PO TBDP
8.0000 mg | ORAL_TABLET | Freq: Once | ORAL | Status: AC
  Administered 2012-06-26: 8 mg via ORAL
  Filled 2012-06-26: qty 2

## 2012-06-26 MED ORDER — ONDANSETRON HCL 4 MG/2ML IJ SOLN: 4.0000 mg | Freq: Once | INTRAMUSCULAR | Status: DC

## 2012-06-26 MED ORDER — HYDROCODONE-ACETAMINOPHEN 5-325 MG PO TABS
1.0000 | ORAL_TABLET | Freq: Once | ORAL | Status: AC
  Administered 2012-06-26: 1 via ORAL
  Filled 2012-06-26: qty 1

## 2012-06-26 MED ORDER — CYCLOBENZAPRINE HCL 10 MG PO TABS: 10.0000 mg | ORAL_TABLET | Freq: Two times a day (BID) | ORAL | Status: AC | PRN

## 2012-06-26 MED ORDER — DIAZEPAM 5 MG PO TABS: 5.0000 mg | ORAL_TABLET | Freq: Four times a day (QID) | ORAL | Status: DC | PRN

## 2012-06-26 MED ORDER — ACETAMINOPHEN 325 MG PO TABS: 650.0000 mg | ORAL_TABLET | ORAL | Status: DC | PRN

## 2012-06-26 MED ORDER — MORPHINE SULFATE 4 MG/ML IJ SOLN: 4.0000 mg | INTRAMUSCULAR | Status: DC | PRN

## 2012-06-26 NOTE — ED Provider Notes (Signed)
Medical screening examination/treatment/procedure(s) were conducted as a shared visit with non-physician practitioner(s) and myself.  I personally evaluated the patient during the encounter  Low back pain radiating down both legs since fall in tub yesterday. NO previous back problems. No weakness, numbness, tingling, incontinence, fever, vomiting.  Referred to ED by dialysis center.  Glynn Octave, MD 06/26/12 1905

## 2012-06-26 NOTE — ED Notes (Signed)
Pt medicated for n/v . Pt wife is short with staff after "demanding" meds for pt she is getting irritated because pt has not been discharged as yet. Explained wanted pt nausea relieved. Pt is kind and agreeable. Pt wife is aggressive with staff.

## 2012-06-26 NOTE — Discharge Instructions (Signed)
Your dialysis has been rescheduled for tomorrow at [facility] Exercises Back exercises help treat and prevent back injuries. The goal of back exercises is to increase the strength of your abdominal and back muscles and the flexibility of your back. These exercises should be started when you no longer have back pain. Back exercises include:  Pelvic Tilt. Lie on your back with your knees bent. Tilt your pelvis until the lower part of your back is against the floor. Hold this position 5 to 10 sec and repeat 5 to 10 times.   Knee to Chest. Pull first 1 knee up against your chest and hold for 20 to 30 seconds, repeat this with the other knee, and then both knees. This may be done with the other leg straight or bent, whichever feels better.   Sit-Ups or Curl-Ups. Bend your knees 90 degrees. Start with tilting your pelvis, and do a partial, slow sit-up, lifting your trunk only 30 to 45 degrees off the floor. Take at least 2 to 3 seconds for each sit-up. Do not do sit-ups with your knees out straight. If partial sit-ups are difficult, simply do the above but with only tightening your abdominal muscles and holding it as directed.   Hip-Lift. Lie on your back with your knees flexed 90 degrees. Push down with your feet and shoulders as you raise your hips a couple inches off the floor; hold for 10 seconds, repeat 5 to 10 times.   Back arches. Lie on your stomach, propping yourself up on bent elbows. Slowly press on your hands, causing an arch in your low back. Repeat 3 to 5 times. Any initial stiffness and discomfort should lessen with repetition over time.   Shoulder-Lifts. Lie face down with arms beside your body. Keep hips and torso pressed to floor as you slowly lift your head and shoulders off the floor.  Do not overdo your exercises, especially in the beginning. Exercises may cause you some mild back discomfort which lasts for a few minutes; however, if the pain is more severe, or lasts for more than 15  minutes, do not continue exercises until you see your caregiver. Improvement with exercise therapy for back problems is slow.  See your caregivers for assistance with developing a proper back exercise program. Document Released: 01/20/2005 Document Revised: 12/02/2011 Document Reviewed: 12/13/2005 [facility] Patient Information 2012 [place], [place].Back Pain, Adult Low back pain is very common. About 1 in 5 people have back pain.The cause of low back pain is rarely dangerous. The pain often gets better over time.About half of people with a sudden onset of back pain feel better in just 2 weeks. About 8 in 10 people feel better by 6 weeks.  CAUSES Some common causes of back pain include:  Strain of the muscles or ligaments supporting the spine.   Wear and tear (degeneration) of the spinal discs.   Arthritis.   Direct injury to the back.  DIAGNOSIS Most of the time, the direct cause of low back pain is not known.However, back pain can be treated effectively even when the exact cause of the pain is unknown.Answering your caregiver's questions about your overall health and symptoms is one of the most accurate ways to make sure the cause of your pain is not dangerous. If your caregiver needs more information, he or she may order lab work or imaging tests (X-rays or MRIs).However, even if imaging tests show changes in your back, this usually does not require surgery. HOME CARE INSTRUCTIONS For many  people, back pain returns.Since low back pain is rarely dangerous, it is often a condition that people can learn to [facility] their own.   Remain active. It is stressful on the back to sit or stand in one place. Do not sit, drive, or stand in one place for more than 30 minutes at a time. Take short walks on level surfaces as soon as pain allows.Try to increase the length of time you walk each day.   Do not stay in bed.Resting more than 1 or 2 days can delay your recovery.   Do not avoid exercise or  work.Your body is made to move.It is not dangerous to be active, even though your back may hurt.Your back will likely heal faster if you return to being active before your pain is gone.   Pay attention to your body when you bend and lift. Many people have less discomfortwhen lifting if they bend their knees, keep the load close to their bodies,and avoid twisting. Often, the most comfortable positions are those that put less stress on your recovering back.   Find a comfortable position to sleep. Use a firm mattress and lie on your side with your knees slightly bent. If you lie on your back, put a pillow under your knees.   Only take over-the-counter or prescription medicines as directed by your caregiver. Over-the-counter medicines to reduce pain and inflammation are often the most helpful.Your caregiver may prescribe muscle relaxant drugs.These medicines help dull your pain so you can more quickly return to your normal activities and healthy exercise.   Put ice on the injured area.   Put ice in a plastic bag.   Place a towel between your skin and the bag.   Leave the ice on for 15 to 20 minutes, 3 to 4 times a day for the first 2 to 3 days. After that, ice and heat may be alternated to reduce pain and spasms.   Ask your caregiver about trying back exercises and gentle massage. This may be of some benefit.   Avoid feeling anxious or stressed.Stress increases muscle tension and can worsen back pain.It is important to recognize when you are anxious or stressed and learn ways to manage it.Exercise is a great option.  SEEK MEDICAL CARE IF:  You have pain that is not relieved with rest or medicine.   You have pain that does not improve in 1 week.   You have new symptoms.   You are generally not feeling well.  SEEK IMMEDIATE MEDICAL CARE IF:   You have pain that radiates from your back into your legs.   You develop new bowel or bladder control problems.   You have unusual  weakness or numbness in your arms or legs.   You develop nausea or vomiting.   You develop abdominal pain.   You feel faint.  Document Released: 12/13/2005 Document Revised: 12/02/2011 Document Reviewed: 05/03/2011 [facility] Patient Information 2012 [place], [place].

## 2012-06-26 NOTE — ED Notes (Signed)
Family at bedside. Wife reports that the contact number she gave registration was wrong number. Her contact number is [phone]. Registration made aware.

## 2012-06-26 NOTE — Progress Notes (Signed)
Observation review is complete. 

## 2012-06-26 NOTE — ED Provider Notes (Signed)
History     CSN: [id number]  Arrival date & time 06/26/12  6295   First MD Initiated Contact with Patient 06/26/12 1001     10:18 AM HPI Patient reports he fell in the tub yesterday and hit his lower back. Reports since then has had severe lower back pain with radiation to bilateral lower chimneys. Denies incontinence, saddle anesthesias, perineal numbness, abdominal pain, nausea, vomiting. Reports he attempted to go to dialysis today but  the pain was so severe he advised him to come to the emergency room. Patient is a 71 y.o. male presenting with back pain. The history is provided by the patient.  Back Pain  This is a new problem. The current episode started yesterday. The problem occurs constantly. The problem has been gradually worsening. The pain is associated with falling. The pain is present in the lumbar spine. The quality of the pain is described as aching and shooting. The pain radiates to the left thigh, left knee, left foot, right thigh, right knee and right foot. The pain is severe. The symptoms are aggravated by twisting, certain positions and bending. Associated symptoms include numbness. Pertinent negatives include no fever, no headaches, no abdominal pain, no bowel incontinence, no perianal numbness, no bladder incontinence, no dysuria, no pelvic pain, no leg pain, no paresthesias, no paresis, no tingling and no weakness. He has tried nothing for the symptoms. The treatment provided no relief.    Past Medical History  Diagnosis Date  . Hypertension   . Renal disorder     History reviewed. No pertinent past surgical history.  History reviewed. No pertinent family history.  History  Substance Use Topics  . Smoking status: Never Smoker   . Smokeless tobacco: Not on file  . Alcohol Use: No      Review of Systems  Constitutional: Negative for fever and chills.  HENT: Negative for neck pain and neck stiffness.   Respiratory: Negative for cough and shortness of breath.     Cardiovascular: Negative for palpitations.  Gastrointestinal: Negative for nausea, vomiting, abdominal pain and bowel incontinence.  Genitourinary: Negative for bladder incontinence, dysuria, hematuria, flank pain and pelvic pain.  Musculoskeletal: Positive for back pain. Negative for myalgias and gait problem.       Denies saddle anesthesias, perineal numbness, bowel incontinence, urinary incontinence  Neurological: Positive for numbness. Negative for dizziness, tingling, weakness, headaches and paresthesias.  All other systems reviewed and are negative.    Allergies  Review of patient's allergies indicates no known allergies.  Home Medications   Current Outpatient Rx  Name Route Sig Dispense Refill  . AMLODIPINE BESYLATE 10 MG PO TABS Oral Take 10 mg by mouth every other day.    . ASPIRIN EC 81 MG PO TBEC Oral Take 81 mg by mouth daily.    Marland Kitchen NEPHRO-VITE 0.8 MG PO TABS Oral Take 0.8 mg by mouth at bedtime.    Marland Kitchen CALCIUM ACETATE 667 MG PO CAPS Oral Take 2,001 mg by mouth 3 (three) times daily with meals.    Marland Kitchen LOSARTAN POTASSIUM 100 MG PO TABS Oral Take 100 mg by mouth every other day.      BP 187/67  Pulse 63  Temp 98.8 F (37.1 C) (Oral)  Resp 20  SpO2 99%  Physical Exam  Vitals reviewed. Constitutional: He is oriented to person, place, and time. He appears well-developed and well-nourished.  HENT:  Head: Normocephalic and atraumatic.  Eyes: Conjunctivae are normal. Pupils are equal, round, and reactive to light.  Neck: Normal range of motion. Neck supple.  Cardiovascular: Normal rate, regular rhythm and normal heart sounds.   Pulmonary/Chest: Effort normal and breath sounds normal.  Abdominal: Soft. Bowel sounds are normal. He exhibits no distension. There is no tenderness.  Musculoskeletal:       Lumbar back: He exhibits tenderness.       Back:  Neurological: He is alert and oriented to person, place, and time.  Skin: Skin is warm and dry. No rash noted. No erythema.  No pallor.  Psychiatric: He has a normal mood and affect. His behavior is normal.    ED Course  Procedures   Potassium (Final result)   Component (Lab Inquiry)      Result Time  K    06/26/12 1406  3.7        Dg Lumbar Spine Complete  06/26/2012  *RADIOLOGY REPORT*  Clinical Data: Fall, back pain, bilateral hip pain  LUMBAR SPINE - COMPLETE 4+ VIEW  Comparison: None.  Findings: Five views of the lumbar spine submitted.  No acute fracture or subluxation.  Mild disc space flattening noted at L5 S1 level.  Mild atherosclerotic calcifications abdominal aorta and iliac arteries.  IMPRESSION: No acute fracture or subluxation.  Mild disc space flattening at L5 S1 level.  Original Report Authenticated By: Natasha Mead, M.D.   Dg Hip Bilateral W/pelvis  06/26/2012  *RADIOLOGY REPORT*  Clinical Data: Larey Seat in bathtub, bilateral hip pain, low back pain  BILATERAL HIP WITH PELVIS - 4+ VIEW  Comparison: 05/07/2011 left hip radiographs  Findings: Mild diffuse osseous demineralization. Symmetric appearing hip and SI joints. Pelvis appears intact. No acute fracture, dislocation or bone destruction.  IMPRESSION: No acute osseous abnormalities.  Original Report Authenticated By: Lollie Marrow, M.D.     MDM   12:30 PM Spoke with Dr. Chales Abrahams who will be unable to do dialysis in hospital unless patient is admitted. I do not feel patient has indication for admission. Patient is able to ambulate with severe pain. States he came to ED on the bus. Dr. Chales Abrahams is able to reschedule his dialysis for tomorrow at noon. Requests  Potassium check. Will continue to treat pain in CDU on back pain protocol.   1:33 PM Pt reports pain somewhat improved after medication. After patient and spouse that we will continue pain control and likely d/c with pain medication. Wife becomes confrontational and states that they cannot afford medication. I asked about medicare helping to pay and spouse states it is not time for medicare yet.  Advised spouse that we are unable to help with narcotic pain medication. Discussed with case with Thurston Hole, Case Management who will come see the patient. Pt currently states that pain medication today has helped his symptoms somewhat. Has eaten lunch without difficulty  2:44 PM Anne, Case management attempted to set up walker and bedside commode but spouse refused. Pt has begun to vomit and is ordered 8mg  ODT Zofran. States that they still want D/c and do not want to stay in the ED. Advised pt to return for worsening symptoms if needed.     Thomasene Lot, PA-C 06/26/12 1601

## 2012-06-26 NOTE — Progress Notes (Signed)
Consulted by [employer] PA to discuss medication assistance with patient and his wife. Patient does have active insurance through Medicare Part A&B as well as AARP supplement. We are waiting for patient's potassium result to determine if patient can be discharged to home with outpatient dialysis or if needs admission. Will monitor for results and speak with patient at that time.

## 2012-06-26 NOTE — ED Notes (Signed)
Regular Diet Ordered 

## 2012-06-26 NOTE — ED Notes (Signed)
Pt c/o lower back pain with radiation to left leg after slipping in shower yesterday; pt denies LOC: pt sts unable to go to dialysis today due to pain

## 2012-06-26 NOTE — Progress Notes (Signed)
Met with patient and wife. Wife states they did not need any assistance for medications. She was just stating that they do not have the money today. I opened up discussion about home equipment that may be useful for safety purposes. Wife agreed to speak with Dr. Helyn Numbers (PCP) about the list of items (walker, 3-in-one, shower chair, HH PT/OT) I gave her.

## 2012-07-27 ENCOUNTER — Encounter (HOSPITAL_COMMUNITY): Payer: Self-pay | Admitting: *Deleted

## 2012-07-27 ENCOUNTER — Encounter (HOSPITAL_COMMUNITY)
Admission: RE | Disposition: A | Discharge status: Home / Self Care | Payer: Self-pay | Source: Ambulatory Visit | Attending: Gastroenterology

## 2012-07-27 ENCOUNTER — Ambulatory Visit (HOSPITAL_COMMUNITY)
Admission: RE | Admit: 2012-07-27 | Discharge: 2012-07-27 | Disposition: A | Discharge status: Home / Self Care | Payer: Medicare Other | Source: Ambulatory Visit | Attending: Gastroenterology | Admitting: Gastroenterology

## 2012-07-27 DIAGNOSIS — N186 End stage renal disease: Secondary | ICD-10-CM | POA: Insufficient documentation

## 2012-07-27 DIAGNOSIS — K31819 Angiodysplasia of stomach and duodenum without bleeding: Secondary | ICD-10-CM | POA: Insufficient documentation

## 2012-07-27 DIAGNOSIS — E119 Type 2 diabetes mellitus without complications: Secondary | ICD-10-CM | POA: Insufficient documentation

## 2012-07-27 DIAGNOSIS — I120 Hypertensive chronic kidney disease with stage 5 chronic kidney disease or end stage renal disease: Secondary | ICD-10-CM | POA: Insufficient documentation

## 2012-07-27 DIAGNOSIS — D126 Benign neoplasm of colon, unspecified: Secondary | ICD-10-CM | POA: Insufficient documentation

## 2012-07-27 DIAGNOSIS — K5520 Angiodysplasia of colon without hemorrhage: Secondary | ICD-10-CM | POA: Insufficient documentation

## 2012-07-27 DIAGNOSIS — I252 Old myocardial infarction: Secondary | ICD-10-CM | POA: Insufficient documentation

## 2012-07-27 DIAGNOSIS — I2510 Atherosclerotic heart disease of native coronary artery without angina pectoris: Secondary | ICD-10-CM | POA: Insufficient documentation

## 2012-07-27 DIAGNOSIS — Z79899 Other long term (current) drug therapy: Secondary | ICD-10-CM | POA: Insufficient documentation

## 2012-07-27 HISTORY — DX: Anemia, unspecified: D64.9

## 2012-07-27 HISTORY — DX: Gastritis, unspecified, without bleeding: K29.70

## 2012-07-27 HISTORY — DX: Atherosclerotic heart disease of native coronary artery without angina pectoris: I25.10

## 2012-07-27 HISTORY — DX: Shortness of breath: R06.02

## 2012-07-27 HISTORY — DX: Cough, unspecified: R05.9

## 2012-07-27 HISTORY — DX: Dizziness and giddiness: R42

## 2012-07-27 HISTORY — DX: Insomnia, unspecified: G47.00

## 2012-07-27 HISTORY — DX: Weakness: R53.1

## 2012-07-27 HISTORY — DX: Acute myocardial infarction, unspecified: I21.9

## 2012-07-27 HISTORY — PX: COLONOSCOPY: SHX5424

## 2012-07-27 HISTORY — PX: ESOPHAGOGASTRODUODENOSCOPY: SHX5428

## 2012-07-27 HISTORY — DX: Other fecal abnormalities: R19.5

## 2012-07-27 HISTORY — DX: Cough: R05

## 2012-07-27 SURGERY — EGD (ESOPHAGOGASTRODUODENOSCOPY)
Anesthesia: Moderate Sedation

## 2012-07-27 MED ORDER — BUTAMBEN-TETRACAINE-BENZOCAINE 2-2-14 % EX AERO
INHALATION_SPRAY | CUTANEOUS | Status: DC | PRN
  Administered 2012-07-27: 2 via TOPICAL

## 2012-07-27 MED ORDER — MIDAZOLAM HCL 10 MG/2ML IJ SOLN
INTRAMUSCULAR | Status: DC | PRN
  Administered 2012-07-27: 2 mg via INTRAVENOUS
  Administered 2012-07-27: 1 mg via INTRAVENOUS
  Administered 2012-07-27: 2 mg via INTRAVENOUS

## 2012-07-27 MED ORDER — FENTANYL CITRATE 0.05 MG/ML IJ SOLN
INTRAMUSCULAR | Status: DC | PRN
  Administered 2012-07-27 (×2): 25 ug via INTRAVENOUS

## 2012-07-27 MED ORDER — MIDAZOLAM HCL 10 MG/2ML IJ SOLN
INTRAMUSCULAR | Status: AC
  Filled 2012-07-27: qty 4

## 2012-07-27 MED ORDER — SODIUM CHLORIDE 0.9 % IV SOLN
Freq: Once | INTRAVENOUS | Status: AC
  Administered 2012-07-27: 11:00:00 via INTRAVENOUS

## 2012-07-27 MED ORDER — DIPHENHYDRAMINE HCL 50 MG/ML IJ SOLN
INTRAMUSCULAR | Status: AC
  Filled 2012-07-27: qty 1

## 2012-07-27 MED ORDER — FENTANYL CITRATE 0.05 MG/ML IJ SOLN
INTRAMUSCULAR | Status: AC
  Filled 2012-07-27: qty 4

## 2012-07-27 NOTE — Op Note (Signed)
[facility] [address] [place], [place]  [address]  COLONOSCOPY PROCEDURE REPORT  PATIENT:  Ronald, Brewer  MR#:  [id number] BIRTHDATE:  04-19-1941, 71 yrs. old  GENDER:  male ENDOSCOPIST:  Carman Ching REF. BY:  Primitivo Gauze, M.D. PROCEDURE DATE:  07/27/2012 PROCEDURE:  Colonoscopy with biopsy ASA CLASS:  Class III INDICATIONS:  blood in stool MEDICATIONS:   Fentanyl 50 mcg IV, Versed 5 mg IV  DESCRIPTION OF PROCEDURE:   After the risks and benefits and of the procedure were explained, informed consent was obtained. Digital rectal exam was performed and revealed no abnormalities. The  endoscope was introduced through the anus and advanced to the cecum.  The quality of the prep was good..  The instrument was then slowly withdrawn as the colon was fully examined. 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    The scope was then withdrawn from the patient and the procedure completed.  COMPLICATIONS:  A complication of none occured on 07/27/2012 at. 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 This could be a possible source of intermittent heme positive stool RECOMMENDATIONS: continue the same medications and see back in the office in 2 months  REPEAT EXAM:  No  ______________________________ Carman Ching  CC:  Primitivo Gauze, MD  n. Rosalie DoctorCarman Ching at 07/27/2012 12:34 PM  Rowland Lathe, [phone]

## 2012-07-27 NOTE — H&P (Signed)
  Subjective:   Patient is a 71 y.o. male presents with heme positive stool and anemia. He is a hemodialysis patient. No heartburn or indigestion. His only symptom has been poor appetite. He comes in now for diagnostic EGD and colonoscopy.. Procedure including risks and benefits discussed in office.  There are no active problems to display for this patient.  Past Medical History  Diagnosis Date  . Hypertension   . Renal disorder     esrd  . Coronary artery disease   . Myocardial infarction   . Anemia   . Diabetes mellitus     25 years ago  . Gastritis   . Renal dialysis status   . Occult blood in stools   . Shortness of breath   . Cough   . Dizziness   . Weakness generalized   . Insomnia     History reviewed. No pertinent past surgical history.  Prescriptions prior to admission  Medication Sig Dispense Refill  . amLODipine (NORVASC) 10 MG tablet Take 10 mg by mouth every other day.      Marland Kitchen aspirin EC 81 MG tablet Take 81 mg by mouth daily.      Marland Kitchen b complex-vitamin c-folic acid (NEPHRO-VITE) 0.8 MG TABS Take 0.8 mg by mouth at bedtime.      . calcium acetate (PHOSLO) 667 MG capsule Take 2,001 mg by mouth 3 (three) times daily with meals.      Marland Kitchen losartan (COZAAR) 100 MG tablet Take 100 mg by mouth every other day.       No Known Allergies  History  Substance Use Topics  . Smoking status: Former Games developer  . Smokeless tobacco: Not on file  . Alcohol Use: No    History reviewed. No pertinent family history.   Objective:   Patient Vitals for the past 8 hrs:  BP Temp Temp src Resp SpO2 Height Weight  07/27/12 1049 190/85 mmHg 98.6 F (37 C) Oral 21  99 % 5\' 10"  (1.778 m) 79.379 kg (175 lb)         See MD Preop evaluation      Assessment:   Active Problems:  * No active hospital problems. *  1. Hemoccult-positive stool 2. End-stage renal disease on chronic hemodialysis  Plan:   We'll proceed with EGD and colonoscopy on an outpatient basis in send him home  following the procedure.

## 2012-07-27 NOTE — Op Note (Signed)
[facility] [address] [place], [place]  [address]  ENDOSCOPY PROCEDURE REPORT  PATIENT:  Ronald Brewer, Ronald Brewer  MR#:  [id number] BIRTHDATE:  25-Sep-1941, 71 yrs. old  GENDER:  male  ENDOSCOPIST:  Carman Ching Referred by:  Primitivo Gauze, M.D.  PROCEDURE DATE:  07/27/2012 PROCEDURE:  EGD, diagnostic 43235 ASA CLASS:  Class III INDICATIONS:  blood in stool  MEDICATIONS:   Fentanyl 50 mcg IV, Versed 4 mg IV TOPICAL ANESTHETIC:  Cetacaine Spray  DESCRIPTION OF PROCEDURE:   After the risks and benefits of the procedure were explained, informed consent was obtained.  The Pentax Gastroscope [id number] endoscope was introduced through the mouth and advanced to the second portion of the duodenum.  The instrument was slowly withdrawn as the mucosa was fully examined. <<PROCEDUREIMAGES>>  the duodenal bulb and second duodenum were without abnormalities. 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 this was on the greater curve was not actively bleeding. It could have been scope trauma. There was no sign of coffee ground material or other indications of recent bleeding  nl esophagus.    Retroflexed views revealed no abnormalities.    The scope was then withdrawn from the patient and the procedure completed.  COMPLICATIONS:  A complication of none occurred on 07/27/2012 at.  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 No active bleeding currently RECOMMENDATIONS: 1) Proceed with a Colonscopy.  ______________________________ Carman Ching  CC:  Primitivo Gauze, MD  n. Rosalie DoctorCarman Ching at 07/27/2012 12:27 PM  Rowland Lathe, [phone]

## 2012-07-28 ENCOUNTER — Encounter (HOSPITAL_COMMUNITY): Payer: Self-pay | Admitting: Gastroenterology

## 2012-07-28 ENCOUNTER — Encounter (HOSPITAL_COMMUNITY): Payer: Self-pay

## 2012-12-07 ENCOUNTER — Encounter: Payer: Medicare Other | Admitting: Cardiology

## 2012-12-22 ENCOUNTER — Other Ambulatory Visit (HOSPITAL_COMMUNITY): Payer: Self-pay | Admitting: Nephrology

## 2012-12-22 DIAGNOSIS — Z992 Dependence on renal dialysis: Secondary | ICD-10-CM

## 2012-12-23 ENCOUNTER — Ambulatory Visit (HOSPITAL_COMMUNITY)
Admission: RE | Admit: 2012-12-23 | Discharge: 2012-12-23 | Disposition: A | Discharge status: Home / Self Care | Payer: Medicare Other | Source: Ambulatory Visit | Attending: Nephrology | Admitting: Nephrology

## 2012-12-23 ENCOUNTER — Other Ambulatory Visit (HOSPITAL_COMMUNITY): Payer: Self-pay | Admitting: Nephrology

## 2012-12-23 VITALS — BP 164/72 | HR 82 | Temp 98.7°F | Resp 16

## 2012-12-23 DIAGNOSIS — I2510 Atherosclerotic heart disease of native coronary artery without angina pectoris: Secondary | ICD-10-CM | POA: Insufficient documentation

## 2012-12-23 DIAGNOSIS — I252 Old myocardial infarction: Secondary | ICD-10-CM | POA: Insufficient documentation

## 2012-12-23 DIAGNOSIS — Z992 Dependence on renal dialysis: Secondary | ICD-10-CM | POA: Insufficient documentation

## 2012-12-23 DIAGNOSIS — T82898A Other specified complication of vascular prosthetic devices, implants and grafts, initial encounter: Secondary | ICD-10-CM | POA: Insufficient documentation

## 2012-12-23 DIAGNOSIS — I120 Hypertensive chronic kidney disease with stage 5 chronic kidney disease or end stage renal disease: Secondary | ICD-10-CM | POA: Insufficient documentation

## 2012-12-23 DIAGNOSIS — Y849 Medical procedure, unspecified as the cause of abnormal reaction of the patient, or of later complication, without mention of misadventure at the time of the procedure: Secondary | ICD-10-CM | POA: Insufficient documentation

## 2012-12-23 DIAGNOSIS — D631 Anemia in chronic kidney disease: Secondary | ICD-10-CM | POA: Insufficient documentation

## 2012-12-23 DIAGNOSIS — N186 End stage renal disease: Secondary | ICD-10-CM | POA: Insufficient documentation

## 2012-12-23 MED ORDER — MIDAZOLAM HCL 2 MG/2ML IJ SOLN
INTRAMUSCULAR | Status: AC | PRN
  Administered 2012-12-23: 2 mg via INTRAVENOUS

## 2012-12-23 MED ORDER — FENTANYL CITRATE 0.05 MG/ML IJ SOLN
INTRAMUSCULAR | Status: AC | PRN
  Administered 2012-12-23: 50 ug via INTRAVENOUS

## 2012-12-23 MED ORDER — IOHEXOL 300 MG/ML  SOLN
100.0000 mL | Freq: Once | INTRAMUSCULAR | Status: AC | PRN
  Administered 2012-12-23: 40 mL via INTRAVENOUS

## 2012-12-23 MED ORDER — FENTANYL CITRATE 0.05 MG/ML IJ SOLN
INTRAMUSCULAR | Status: AC
  Filled 2012-12-23: qty 4

## 2012-12-23 MED ORDER — MIDAZOLAM HCL 2 MG/2ML IJ SOLN
INTRAMUSCULAR | Status: AC
  Filled 2012-12-23: qty 6

## 2012-12-23 MED ORDER — ALTEPLASE 2 MG IJ SOLR
2.0000 mg | Freq: Once | INTRAMUSCULAR | Status: AC
  Administered 2012-12-23: 4 mg
  Filled 2012-12-23: qty 2

## 2012-12-23 MED ORDER — ALTEPLASE 2 MG IJ SOLR
2.0000 mg | Freq: Once | INTRAMUSCULAR | Status: DC
  Filled 2012-12-23: qty 2

## 2012-12-23 MED ORDER — HEPARIN SODIUM (PORCINE) 1000 UNIT/ML IJ SOLN
INTRAMUSCULAR | Status: AC
  Administered 2012-12-23: 30000 [IU] via INTRAVENOUS
  Filled 2012-12-23: qty 1

## 2012-12-23 NOTE — Procedures (Signed)
Interventional Radiology Procedure Note  Procedure:  1. Successful declot of LUE straight AVG 2. Successful PTA of venous anastomosis to 7 mm and subclavian vein to 10 mm Complications: None Recommendations: - May resume dialysis - Access remains amenable to percutaneous intervention  Signed,  Sterling Big, MD Vascular & Interventional Radiologist [facility]

## 2012-12-23 NOTE — ED Notes (Signed)
Pt's dressing on lt upper arm is clean, dry and intact.  Pt discharged in the care of his wife as they wait for [facility] to pick them up

## 2012-12-23 NOTE — ED Notes (Signed)
Discharge instructions reviewed with pt and his wife and a copy given

## 2012-12-23 NOTE — H&P (Signed)
Interventional Radiology Pre-Procedure Evaluation  Reason for Consult: Thrombosed hemodialysis access Referring Physician: Dr. Darrick Penna   HPI: Ronald Brewer is an 71 y.o. male with ESRD on hemodialysis via a LUE straight arteriovenous graft.  His last successful dialysis session was this past Tuesday and his graft is now thrombosed.  His last prior intervention was a successful declot and angioplasty of venous anastomotic and subclavian stenoses over a year ago in Sept 2012.    Past Medical History:  Past Medical History  Diagnosis Date  . Hypertension   . Renal disorder     esrd  . Coronary artery disease   . Myocardial infarction   . Anemia   . Diabetes mellitus     25 years ago  . Gastritis   . Renal dialysis status   . Occult blood in stools   . Shortness of breath   . Cough   . Dizziness   . Weakness generalized   . Insomnia     Surgical History:  Past Surgical History  Procedure Date  . Esophagogastroduodenoscopy 07/27/2012    Procedure: ESOPHAGOGASTRODUODENOSCOPY (EGD);  Surgeon: Vertell Novak., MD;  Location: [facility];  Service: Endoscopy;  Laterality: N/A;  . Colonoscopy 07/27/2012    Procedure: COLONOSCOPY;  Surgeon: Vertell Novak., MD;  Location: [facility] ENDOSCOPY;  Service: Endoscopy;  Laterality: N/A;    Family History: No family history on file.  Social History:  reports that he has quit smoking. He does not have any smokeless tobacco history on file. He reports that he does not drink alcohol or use illicit drugs.  Allergies: No Known Allergies  Medications: I have reviewed the patient's current medications.  ROS: See HPI for pertinent findings, otherwise complete 10 system review negative.  Physical Exam: Blood pressure 166/77, pulse 84, temperature 98.7 F (37.1 C), resp. rate 16, SpO2 94.00%.  Extremities: LUE straight AVG is completely thrombosed.  There is a strong pulse at the arterial anastomosis.  The forearm and hand are mildly  edematous.  Hand is well perfused.  Cardiac: RRR Pulmonary: CTA B HEENT: Malampati 3   Labs: CBC No results found for this basename: WBC:2,HGB:2,HCT:2,PLT:2 in the last 72 hours MET  Basename 12/23/12 0749  NA --  K 3.5  CL --  CO2 --  GLUCOSE --  BUN --  CREATININE --  CALCIUM --   No results found for this basename: PROT,ALBUMIN,AST,ALT,ALKPHOS,BILITOT,BILIDIR,IBILI,LIPASE in the last 72 hours PT/INR No results found for this basename: LABPROT:2,INR:2 in the last 72 hours ABG No results found for this basename: PHART:2,PCO2:2,PO2:2,HCO3:2 in the last 72 hours    No results found.  Assessment/Plan: Thrombosed LUE AVG - Declot procedure with PTA/stenting as needed  Signed,  Sterling Big, MD Vascular & Interventional Radiologist [facility]

## 2013-01-07 ENCOUNTER — Other Ambulatory Visit: Payer: Self-pay

## 2013-01-07 ENCOUNTER — Emergency Department (HOSPITAL_COMMUNITY)
Admission: EM | Admit: 2013-01-07 | Discharge: 2013-01-07 | Disposition: A | Discharge status: Home / Self Care | Payer: Medicare Other | Attending: Emergency Medicine | Admitting: Emergency Medicine

## 2013-01-07 ENCOUNTER — Emergency Department (HOSPITAL_COMMUNITY): Payer: Medicare Other

## 2013-01-07 ENCOUNTER — Encounter (HOSPITAL_COMMUNITY): Payer: Self-pay | Admitting: *Deleted

## 2013-01-07 DIAGNOSIS — Z862 Personal history of diseases of the blood and blood-forming organs and certain disorders involving the immune mechanism: Secondary | ICD-10-CM | POA: Insufficient documentation

## 2013-01-07 DIAGNOSIS — R112 Nausea with vomiting, unspecified: Secondary | ICD-10-CM | POA: Insufficient documentation

## 2013-01-07 DIAGNOSIS — R059 Cough, unspecified: Secondary | ICD-10-CM | POA: Insufficient documentation

## 2013-01-07 DIAGNOSIS — I120 Hypertensive chronic kidney disease with stage 5 chronic kidney disease or end stage renal disease: Secondary | ICD-10-CM | POA: Insufficient documentation

## 2013-01-07 DIAGNOSIS — I2510 Atherosclerotic heart disease of native coronary artery without angina pectoris: Secondary | ICD-10-CM | POA: Insufficient documentation

## 2013-01-07 DIAGNOSIS — J209 Acute bronchitis, unspecified: Secondary | ICD-10-CM

## 2013-01-07 DIAGNOSIS — Z8719 Personal history of other diseases of the digestive system: Secondary | ICD-10-CM | POA: Insufficient documentation

## 2013-01-07 DIAGNOSIS — Z7982 Long term (current) use of aspirin: Secondary | ICD-10-CM | POA: Insufficient documentation

## 2013-01-07 DIAGNOSIS — N186 End stage renal disease: Secondary | ICD-10-CM | POA: Insufficient documentation

## 2013-01-07 DIAGNOSIS — R0789 Other chest pain: Secondary | ICD-10-CM

## 2013-01-07 DIAGNOSIS — E119 Type 2 diabetes mellitus without complications: Secondary | ICD-10-CM | POA: Insufficient documentation

## 2013-01-07 DIAGNOSIS — I252 Old myocardial infarction: Secondary | ICD-10-CM | POA: Insufficient documentation

## 2013-01-07 DIAGNOSIS — R05 Cough: Secondary | ICD-10-CM | POA: Insufficient documentation

## 2013-01-07 DIAGNOSIS — Z79899 Other long term (current) drug therapy: Secondary | ICD-10-CM | POA: Insufficient documentation

## 2013-01-07 DIAGNOSIS — Z992 Dependence on renal dialysis: Secondary | ICD-10-CM | POA: Insufficient documentation

## 2013-01-07 DIAGNOSIS — Z87891 Personal history of nicotine dependence: Secondary | ICD-10-CM | POA: Insufficient documentation

## 2013-01-07 LAB — BASIC METABOLIC PANEL
CO2: 28 mEq/L (ref 19–32)
Calcium: 10.2 mg/dL (ref 8.4–10.5)
Creatinine, Ser: 6.19 mg/dL — ABNORMAL HIGH (ref 0.50–1.35)
GFR calc non Af Amer: 8 mL/min — ABNORMAL LOW (ref >90)
Glucose, Bld: 69 mg/dL — ABNORMAL LOW (ref 70–99)

## 2013-01-07 LAB — CBC
MCH: 29.4 pg (ref 26.0–34.0)
MCV: 91 fL (ref 78.0–100.0)
Platelets: 148 10*3/uL — ABNORMAL LOW (ref 150–400)
RDW: 17.2 % — ABNORMAL HIGH (ref 11.5–15.5)
WBC: 6 10*3/uL (ref 4.0–10.5)

## 2013-01-07 LAB — PRO B NATRIURETIC PEPTIDE: Pro B Natriuretic peptide (BNP): >70000 pg/mL — ABNORMAL HIGH (ref 0–125)

## 2013-01-07 MED ORDER — AZITHROMYCIN 250 MG PO TABS: 250.0000 mg | ORAL_TABLET | Freq: Every day | ORAL | Status: DC

## 2013-01-07 NOTE — ED Notes (Addendum)
Pt reports having mid chest pains x several weeks, radiates into his right shoulder. Having n/v this am and sob. Airway intact, able to speak in full sentences. Last dialysis was Friday.

## 2013-01-07 NOTE — ED Provider Notes (Signed)
History     CSN: [id number]  Arrival date & time 01/07/13  1032   First MD Initiated Contact with Patient 01/07/13 1117      Chief Complaint  Patient presents with  . Chest Pain  . Shortness of Breath    (Consider location/radiation/quality/duration/timing/severity/associated sxs/prior treatment) HPI Comments: Patient with extensive pmh including ESRD on HD, CAD with stents.  Presents with two week history of pains in the right side of the chest, feels short of breath.  He denies fevers or chills but has had some productive cough.  Patient is a 72 y.o. male presenting with chest pain and shortness of breath. The history is provided by the patient.  Chest Pain Episode onset: 2 weeks ago. Chest pain occurs constantly. The chest pain is unchanged. The pain is associated with breathing. The severity of the pain is moderate. The quality of the pain is described as sharp. The pain does not radiate. Chest pain is worsened by deep breathing. Primary symptoms include shortness of breath, cough, nausea and vomiting. Pertinent negatives for primary symptoms include no fever.    Shortness of Breath  Associated symptoms include chest pain, cough and shortness of breath. Pertinent negatives include no fever.    Past Medical History  Diagnosis Date  . Hypertension   . Renal disorder     esrd  . Coronary artery disease   . Myocardial infarction   . Anemia   . Diabetes mellitus     25 years ago  . Gastritis   . Renal dialysis status   . Occult blood in stools   . Shortness of breath   . Cough   . Dizziness   . Weakness generalized   . Insomnia     Past Surgical History  Procedure Date  . Esophagogastroduodenoscopy 07/27/2012    Procedure: ESOPHAGOGASTRODUODENOSCOPY (EGD);  Surgeon: Vertell Novak., MD;  Location: [facility];  Service: Endoscopy;  Laterality: N/A;  . Colonoscopy 07/27/2012    Procedure: COLONOSCOPY;  Surgeon: Vertell Novak., MD;  Location: [facility];   Service: Endoscopy;  Laterality: N/A;    History reviewed. No pertinent family history.  History  Substance Use Topics  . Smoking status: Former Games developer  . Smokeless tobacco: Not on file  . Alcohol Use: No      Review of Systems  Constitutional: Negative for fever.  Respiratory: Positive for cough and shortness of breath.   Cardiovascular: Positive for chest pain.  Gastrointestinal: Positive for nausea and vomiting.  All other systems reviewed and are negative.    Allergies  Review of patient's allergies indicates no known allergies.  Home Medications   Current Outpatient Rx  Name  Route  Sig  Dispense  Refill  . AMLODIPINE BESYLATE 10 MG PO TABS   Oral   Take 10 mg by mouth every other day.         . ASPIRIN EC 81 MG PO TBEC   Oral   Take 81 mg by mouth daily.         Marland Kitchen CALCIUM ACETATE 667 MG PO CAPS   Oral   Take 2,001 mg by mouth 3 (three) times daily with meals.         Marland Kitchen LOSARTAN POTASSIUM 100 MG PO TABS   Oral   Take 100 mg by mouth every other day.           BP 155/72  Pulse 75  Temp 97.5 F (36.4 C) (Oral)  Resp 20  SpO2 99%  Physical Exam  Nursing note and vitals reviewed. Constitutional: He is oriented to person, place, and time. He appears well-developed and well-nourished. No distress.  HENT:  Head: Normocephalic and atraumatic.  Mouth/Throat: Oropharynx is clear and moist.  Neck: Normal range of motion. Neck supple.  Cardiovascular: Normal rate and regular rhythm.   No murmur heard. Pulmonary/Chest: Effort normal and breath sounds normal. No respiratory distress. He has no wheezes.  Abdominal: Soft. Bowel sounds are normal. He exhibits no distension. There is no tenderness.  Musculoskeletal: Normal range of motion. He exhibits no edema.  Lymphadenopathy:    He has no cervical adenopathy.  Neurological: He is alert and oriented to person, place, and time.  Skin: Skin is warm and dry. He is not diaphoretic.    ED Course    Procedures (including critical care time)   Labs Reviewed  PRO B NATRIURETIC PEPTIDE  BASIC METABOLIC PANEL  CBC   No results found.   No diagnosis found.   Date: 01/07/2013  Rate: 75  Rhythm: normal sinus rhythm  QRS Axis: left  Intervals: normal  ST/T Wave abnormalities: normal  Conduction Disutrbances:none  Narrative Interpretation:   Old EKG Reviewed: none available    MDM  The patient presents with cough and pleuritic chest pain.  The workup is unremarkable with the exception of the expected abnormalities seen with dialysis.  He is anemic, but is heme negative.  I will prescribe zithromax and he can follow up with dialysis in the morning as scheduled.         Geoffery Lyons, MD 01/07/13 [phone]

## 2013-01-07 NOTE — ED Notes (Signed)
Lab called top process the rest of the labs, said will process prior 1500.

## 2013-01-10 ENCOUNTER — Encounter: Payer: Self-pay | Admitting: Cardiology

## 2013-08-31 ENCOUNTER — Encounter (HOSPITAL_COMMUNITY): Payer: Self-pay | Admitting: Emergency Medicine

## 2013-08-31 ENCOUNTER — Emergency Department (HOSPITAL_COMMUNITY)
Admission: EM | Admit: 2013-08-31 | Discharge: 2013-08-31 | Disposition: A | Discharge status: Home / Self Care | Payer: Medicare Other | Attending: Emergency Medicine | Admitting: Emergency Medicine

## 2013-08-31 DIAGNOSIS — R5381 Other malaise: Secondary | ICD-10-CM | POA: Insufficient documentation

## 2013-08-31 DIAGNOSIS — I120 Hypertensive chronic kidney disease with stage 5 chronic kidney disease or end stage renal disease: Secondary | ICD-10-CM | POA: Insufficient documentation

## 2013-08-31 DIAGNOSIS — E162 Hypoglycemia, unspecified: Secondary | ICD-10-CM

## 2013-08-31 DIAGNOSIS — R112 Nausea with vomiting, unspecified: Secondary | ICD-10-CM | POA: Insufficient documentation

## 2013-08-31 DIAGNOSIS — R630 Anorexia: Secondary | ICD-10-CM | POA: Insufficient documentation

## 2013-08-31 DIAGNOSIS — I252 Old myocardial infarction: Secondary | ICD-10-CM | POA: Insufficient documentation

## 2013-08-31 DIAGNOSIS — G4700 Insomnia, unspecified: Secondary | ICD-10-CM | POA: Insufficient documentation

## 2013-08-31 DIAGNOSIS — D649 Anemia, unspecified: Secondary | ICD-10-CM | POA: Insufficient documentation

## 2013-08-31 DIAGNOSIS — Z992 Dependence on renal dialysis: Secondary | ICD-10-CM | POA: Insufficient documentation

## 2013-08-31 DIAGNOSIS — I2510 Atherosclerotic heart disease of native coronary artery without angina pectoris: Secondary | ICD-10-CM | POA: Insufficient documentation

## 2013-08-31 DIAGNOSIS — E1169 Type 2 diabetes mellitus with other specified complication: Secondary | ICD-10-CM | POA: Insufficient documentation

## 2013-08-31 DIAGNOSIS — Z79899 Other long term (current) drug therapy: Secondary | ICD-10-CM | POA: Insufficient documentation

## 2013-08-31 DIAGNOSIS — Z8719 Personal history of other diseases of the digestive system: Secondary | ICD-10-CM | POA: Insufficient documentation

## 2013-08-31 DIAGNOSIS — Z7982 Long term (current) use of aspirin: Secondary | ICD-10-CM | POA: Insufficient documentation

## 2013-08-31 DIAGNOSIS — R42 Dizziness and giddiness: Secondary | ICD-10-CM | POA: Insufficient documentation

## 2013-08-31 DIAGNOSIS — Z87891 Personal history of nicotine dependence: Secondary | ICD-10-CM | POA: Insufficient documentation

## 2013-08-31 DIAGNOSIS — N186 End stage renal disease: Secondary | ICD-10-CM | POA: Insufficient documentation

## 2013-08-31 LAB — GLUCOSE, CAPILLARY: Glucose-Capillary: 174 mg/dL — ABNORMAL HIGH (ref 70–99)

## 2013-08-31 NOTE — ED Notes (Signed)
hemodiaylsis catheters removed x 2 from upper left arm graft per protocol.  Slight pressure dsg to sites. Pt. For discharge home

## 2013-08-31 NOTE — ED Provider Notes (Signed)
CSN: [id number]     Arrival date & time 08/31/13  1645 History   First MD Initiated Contact with Patient 08/31/13 1650     Chief Complaint  Patient presents with  . Hypoglycemia   (Consider location/radiation/quality/duration/timing/severity/associated sxs/prior Treatment) Patient is a 72 y.o. male presenting with hypoglycemia. The history is provided by the patient, the EMS personnel and the spouse.  Hypoglycemia Associated symptoms: vomiting   Associated symptoms: no shortness of breath    patient does dialysis patient normally dialyzed Monday Wednesday and Friday. Patient was at dialysis today dialysis told EMS that it was complete patient got a low blood sugar down to 59 also had some nausea and vomited once. No chest pain no shortness of breath no abdominal pain. As per the patient's spouse he has not been well for the past week. EMS gave the sugar and blood sugar only went up into the 67 upon arrival here blood sugar was 78. Patient had no complaints upon arrival.  Past Medical History  Diagnosis Date  . Hypertension   . Renal disorder     esrd  . Coronary artery disease   . Myocardial infarction   . Anemia   . Diabetes mellitus     25 years ago  . Gastritis   . Renal dialysis status(V45.11)   . Occult blood in stools   . Shortness of breath   . Cough   . Dizziness   . Weakness generalized   . Insomnia    Past Surgical History  Procedure Laterality Date  . Esophagogastroduodenoscopy  07/27/2012    Procedure: ESOPHAGOGASTRODUODENOSCOPY (EGD);  Surgeon: Vertell Novak., MD;  Location: [facility];  Service: Endoscopy;  Laterality: N/A;  . Colonoscopy  07/27/2012    Procedure: COLONOSCOPY;  Surgeon: Vertell Novak., MD;  Location: [facility];  Service: Endoscopy;  Laterality: N/A;   History reviewed. No pertinent family history. History  Substance Use Topics  . Smoking status: Former Games developer  . Smokeless tobacco: Not on file  . Alcohol Use: No    Review of  Systems  Constitutional: Positive for appetite change and fatigue.  HENT: Negative for congestion.   Respiratory: Negative for shortness of breath.   Cardiovascular: Negative for chest pain.  Gastrointestinal: Positive for nausea and vomiting. Negative for abdominal pain.  Genitourinary: Negative for hematuria.  Musculoskeletal: Negative for back pain.  Skin: Negative for rash.  Neurological: Negative for headaches.  Hematological: Does not bruise/bleed easily.  Psychiatric/Behavioral: Negative for confusion.    Allergies  Review of patient's allergies indicates no known allergies.  Home Medications   Current Outpatient Rx  Name  Route  Sig  Dispense  Refill  . amLODipine (NORVASC) 10 MG tablet   Oral   Take 10 mg by mouth at bedtime.          Marland Kitchen aspirin EC 81 MG tablet   Oral   Take 81 mg by mouth daily.         . calcium acetate (PHOSLO) 667 MG capsule   Oral   Take 667-2,001 mg by mouth 3 (three) times daily with meals. Take 3 with meals and 1-2 with snacks         . losartan (COZAAR) 100 MG tablet   Oral   Take 50-100 mg by mouth at bedtime. Takes 1/2 a tablet if blood pressure is normal         . sucralfate (CARAFATE) 1 GM/10ML suspension   Oral   Take 500 mg  by mouth daily.          BP 187/79  Pulse 83  Temp(Src) 97.9 F (36.6 C) (Oral)  Resp 12  SpO2 98% Physical Exam  Nursing note and vitals reviewed. Constitutional: He is oriented to person, place, and time. He appears well-developed and well-nourished. No distress.  HENT:  Head: Normocephalic and atraumatic.  Mouth/Throat: Oropharynx is clear and moist.  Eyes: Conjunctivae and EOM are normal. Pupils are equal, round, and reactive to light.  Neck: Normal range of motion. Neck supple.  Cardiovascular: Normal rate, regular rhythm and normal heart sounds.   No murmur heard. Pulmonary/Chest: Effort normal and breath sounds normal. No respiratory distress. He has no rales.  Abdominal: Soft.  Bowel sounds are normal. There is no tenderness.  Musculoskeletal: Normal range of motion.  AV fistula good thrill left arm tubing from dialysis still in place.  Neurological: He is alert and oriented to person, place, and time. No cranial nerve deficit. He exhibits normal muscle tone.  Skin: Skin is warm. No rash noted.    ED Course  Procedures (including critical care time) Labs Review Labs Reviewed  GLUCOSE, CAPILLARY - Abnormal; Notable for the following:    Glucose-Capillary 174 (*)    All other components within normal limits  GLUCOSE, CAPILLARY   Imaging Review No results found.  MDM   1. Hypoglycemia    Patient brought in by EMS from dialysis. Patient normally dialyzed on Monday Wednesdays and Fridays his blood sugar at dialysis was down to 59 he felt a little dizzy and nauseated and vomited once. EMS gave him glucose sugar only went up to 67. Patient has been not feeling well and his appetite is been down for the past week to 1-1/2 weeks. Upon arrival here he was oriented no specific complaints. Room air saturation was 99%. Repeat point care blood sugar here was 78. After a meal and a sandwich blood sugar was up to 174. Patient in no acute distress nontoxic. Patient currently does not have a primary care Dr. that would be beneficial resource guide provided. Patient is not a diabetic and not on any oral hypoglycemics or insulin medicines.  According to EMS dialysis was finished doing dialysis.  Shelda Jakes, MD 08/31/13 2014

## 2013-08-31 NOTE — ED Notes (Signed)
IV Team paged to d/access dialysis access.

## 2013-08-31 NOTE — ED Notes (Signed)
Pt to ED from [facility] for hypoglycemia initial 59 after glucose 24grams x2, glucose 67. Pt states has been sick x1.5 week. Pt alert and oriented x4 and c/o being tired. Per EMS, EKG unremarkable, BP-190/97, SpO2-99% on room air, HR-90. Pt finished dialysis Tx.

## 2014-02-07 ENCOUNTER — Encounter (INDEPENDENT_AMBULATORY_CARE_PROVIDER_SITE_OTHER): Payer: Medicare Other | Admitting: Ophthalmology

## 2014-02-07 DIAGNOSIS — H2700 Aphakia, unspecified eye: Secondary | ICD-10-CM

## 2014-02-07 DIAGNOSIS — E1139 Type 2 diabetes mellitus with other diabetic ophthalmic complication: Secondary | ICD-10-CM

## 2014-02-07 DIAGNOSIS — I10 Essential (primary) hypertension: Secondary | ICD-10-CM

## 2014-02-07 DIAGNOSIS — E11359 Type 2 diabetes mellitus with proliferative diabetic retinopathy without macular edema: Secondary | ICD-10-CM

## 2014-02-07 DIAGNOSIS — H43819 Vitreous degeneration, unspecified eye: Secondary | ICD-10-CM

## 2014-02-07 DIAGNOSIS — H35039 Hypertensive retinopathy, unspecified eye: Secondary | ICD-10-CM

## 2014-02-07 DIAGNOSIS — E1165 Type 2 diabetes mellitus with hyperglycemia: Secondary | ICD-10-CM

## 2014-12-25 ENCOUNTER — Emergency Department (HOSPITAL_COMMUNITY): Payer: Medicare Other

## 2014-12-25 ENCOUNTER — Emergency Department (HOSPITAL_COMMUNITY)
Admission: EM | Admit: 2014-12-25 | Discharge: 2014-12-25 | Disposition: A | Discharge status: Home / Self Care | Payer: Medicare Other | Attending: Emergency Medicine | Admitting: Emergency Medicine

## 2014-12-25 ENCOUNTER — Encounter (HOSPITAL_COMMUNITY): Payer: Self-pay | Admitting: Cardiology

## 2014-12-25 DIAGNOSIS — N186 End stage renal disease: Secondary | ICD-10-CM | POA: Insufficient documentation

## 2014-12-25 DIAGNOSIS — Z8719 Personal history of other diseases of the digestive system: Secondary | ICD-10-CM | POA: Insufficient documentation

## 2014-12-25 DIAGNOSIS — I120 Hypertensive chronic kidney disease with stage 5 chronic kidney disease or end stage renal disease: Secondary | ICD-10-CM | POA: Diagnosis not present

## 2014-12-25 DIAGNOSIS — Z992 Dependence on renal dialysis: Secondary | ICD-10-CM | POA: Diagnosis not present

## 2014-12-25 DIAGNOSIS — Z87891 Personal history of nicotine dependence: Secondary | ICD-10-CM | POA: Insufficient documentation

## 2014-12-25 DIAGNOSIS — Z862 Personal history of diseases of the blood and blood-forming organs and certain disorders involving the immune mechanism: Secondary | ICD-10-CM | POA: Diagnosis not present

## 2014-12-25 DIAGNOSIS — I252 Old myocardial infarction: Secondary | ICD-10-CM | POA: Diagnosis not present

## 2014-12-25 DIAGNOSIS — R531 Weakness: Secondary | ICD-10-CM | POA: Diagnosis not present

## 2014-12-25 DIAGNOSIS — Z8669 Personal history of other diseases of the nervous system and sense organs: Secondary | ICD-10-CM | POA: Diagnosis not present

## 2014-12-25 DIAGNOSIS — I2510 Atherosclerotic heart disease of native coronary artery without angina pectoris: Secondary | ICD-10-CM | POA: Insufficient documentation

## 2014-12-25 DIAGNOSIS — Z79899 Other long term (current) drug therapy: Secondary | ICD-10-CM | POA: Diagnosis not present

## 2014-12-25 DIAGNOSIS — E119 Type 2 diabetes mellitus without complications: Secondary | ICD-10-CM | POA: Diagnosis not present

## 2014-12-25 DIAGNOSIS — Z7982 Long term (current) use of aspirin: Secondary | ICD-10-CM | POA: Diagnosis not present

## 2014-12-25 DIAGNOSIS — R202 Paresthesia of skin: Secondary | ICD-10-CM | POA: Diagnosis not present

## 2014-12-25 LAB — CBC WITH DIFFERENTIAL/PLATELET
Basophils Absolute: 0.1 10*3/uL (ref 0.0–0.1)
Basophils Relative: 1 % (ref 0–1)
Eosinophils Absolute: 0.6 10*3/uL (ref 0.0–0.7)
Eosinophils Relative: 16 % — ABNORMAL HIGH (ref 0–5)
HCT: 36.7 % — ABNORMAL LOW (ref 39.0–52.0)
Hemoglobin: 12.2 g/dL — ABNORMAL LOW (ref 13.0–17.0)
LYMPHS ABS: 1.4 10*3/uL (ref 0.7–4.0)
Lymphocytes Relative: 35 % (ref 12–46)
MCH: 29.7 pg (ref 26.0–34.0)
MCHC: 33.2 g/dL (ref 30.0–36.0)
MCV: 89.3 fL (ref 78.0–100.0)
MONOS PCT: 19 % — AB (ref 3–12)
Monocytes Absolute: 0.7 10*3/uL (ref 0.1–1.0)
NEUTROS PCT: 29 % — AB (ref 43–77)
Neutro Abs: 1.1 10*3/uL — ABNORMAL LOW (ref 1.7–7.7)
Platelets: 85 10*3/uL — ABNORMAL LOW (ref 150–400)
RBC: 4.11 MIL/uL — ABNORMAL LOW (ref 4.22–5.81)
RDW: 15.7 % — ABNORMAL HIGH (ref 11.5–15.5)
WBC: 4 10*3/uL (ref 4.0–10.5)

## 2014-12-25 LAB — BASIC METABOLIC PANEL
ANION GAP: 12 (ref 5–15)
CALCIUM: 8.6 mg/dL (ref 8.4–10.5)
CO2: 32 mmol/L (ref 19–32)
Chloride: 95 mEq/L — ABNORMAL LOW (ref 96–112)
Creatinine, Ser: 3.38 mg/dL — ABNORMAL HIGH (ref 0.50–1.35)
GFR calc Af Amer: 19 mL/min — ABNORMAL LOW (ref >90)
GFR calc non Af Amer: 17 mL/min — ABNORMAL LOW (ref >90)
Glucose, Bld: 72 mg/dL (ref 70–99)
Potassium: 3.1 mmol/L — ABNORMAL LOW (ref 3.5–5.1)
Sodium: 139 mmol/L (ref 135–145)

## 2014-12-25 MED ORDER — LOSARTAN POTASSIUM 50 MG PO TABS
100.0000 mg | ORAL_TABLET | Freq: Once | ORAL | Status: AC
  Administered 2014-12-25: 100 mg via ORAL
  Filled 2014-12-25: qty 2

## 2014-12-25 MED ORDER — LOSARTAN POTASSIUM 100 MG PO TABS: 100.0000 mg | ORAL_TABLET | Freq: Every day | ORAL | Status: DC

## 2014-12-25 NOTE — ED Notes (Signed)
Patient transported to X-ray 

## 2014-12-25 NOTE — ED Provider Notes (Signed)
CSN: [id number]     Arrival date & time 12/25/14  1504 History   None    Chief Complaint  Patient presents with  . Weakness     (Consider location/radiation/quality/duration/timing/severity/associated sxs/prior Treatment) Patient is a 73 y.o. male presenting with weakness. The history is provided by the patient.  Weakness This is a recurrent problem. Episode onset: 3 weeks. The problem occurs constantly. The problem has been waxing and waning. Associated symptoms include fatigue, numbness (intermittiant numbness and tingling of fingers for past three weeks.) and weakness. Pertinent negatives include no abdominal pain, chest pain, coughing, diaphoresis, fever, headaches, myalgias, nausea, rash, sore throat or vomiting. Nothing aggravates the symptoms. He has tried nothing for the symptoms.    Past Medical History  Diagnosis Date  . Hypertension   . Renal disorder     esrd  . Coronary artery disease   . Myocardial infarction   . Anemia   . Diabetes mellitus     25 years ago  . Gastritis   . Renal dialysis status(V45.11)   . Occult blood in stools   . Shortness of breath   . Cough   . Dizziness   . Weakness generalized   . Insomnia    Past Surgical History  Procedure Laterality Date  . Esophagogastroduodenoscopy  07/27/2012    Procedure: ESOPHAGOGASTRODUODENOSCOPY (EGD);  Surgeon: Winfield Cunas., MD;  Location: [facility];  Service: Endoscopy;  Laterality: N/A;  . Colonoscopy  07/27/2012    Procedure: COLONOSCOPY;  Surgeon: Winfield Cunas., MD;  Location: [facility];  Service: Endoscopy;  Laterality: N/A;   History reviewed. No pertinent family history. History  Substance Use Topics  . Smoking status: Former Research scientist (life sciences)  . Smokeless tobacco: Not on file  . Alcohol Use: No    Review of Systems  Constitutional: Positive for fatigue. Negative for fever, diaphoresis, activity change and appetite change.  HENT: Negative for facial swelling, sore throat, tinnitus,  trouble swallowing and voice change.   Eyes: Negative for pain, redness and visual disturbance.  Respiratory: Negative for cough, chest tightness, shortness of breath and wheezing.   Cardiovascular: Negative for chest pain, palpitations and leg swelling.  Gastrointestinal: Negative for nausea, vomiting, abdominal pain, diarrhea, constipation and abdominal distention.  Endocrine: Negative.   Genitourinary: Negative.  Negative for dysuria, decreased urine volume, scrotal swelling and testicular pain.  Musculoskeletal: Negative for myalgias, back pain and gait problem.  Skin: Negative.  Negative for rash.  Neurological: Positive for weakness and numbness (intermittiant numbness and tingling of fingers for past three weeks.). Negative for dizziness, tremors and headaches.  Psychiatric/Behavioral: Negative for suicidal ideas, hallucinations and self-injury. The patient is not nervous/anxious.       Allergies  Review of patient's allergies indicates no known allergies.  Home Medications   Prior to Admission medications   Medication Sig Start Date End Date Taking? Authorizing Provider  aspirin EC 81 MG tablet Take 81 mg by mouth daily.   Yes Historical Provider, MD  calcium acetate (PHOSLO) 667 MG capsule Take 667-2,001 mg by mouth 3 (three) times daily with meals. Take 3 with meals and 1-2 with snacks   Yes Historical Provider, MD  losartan (COZAAR) 100 MG tablet Take 50-100 mg by mouth at bedtime. Takes 1/2 a tablet if blood pressure is normal    Historical Provider, MD  losartan (COZAAR) 100 MG tablet Take 1 tablet (100 mg total) by mouth daily. 12/25/14   Margaretann Loveless, MD   BP 203/73 mmHg  Pulse 81  Temp(Src) 98.5 F (36.9 C) (Oral)  Resp 18  Wt 175 lb (79.379 kg)  SpO2 97% Physical Exam  Constitutional: He is oriented to person, place, and time. He appears well-developed and well-nourished. No distress.  HENT:  Head: Normocephalic and atraumatic.  Right Ear: External ear normal.   Left Ear: External ear normal.  Nose: Nose normal.  Mouth/Throat: Oropharynx is clear and moist.  Eyes: Conjunctivae and EOM are normal. Pupils are equal, round, and reactive to light. No scleral icterus.  Neck: Normal range of motion. Neck supple. No JVD present. No tracheal deviation present. No thyromegaly present.  Cardiovascular: Normal rate and intact distal pulses.  Exam reveals no gallop and no friction rub.   No murmur heard. Pulmonary/Chest: Effort normal and breath sounds normal. No stridor. No respiratory distress. He has no wheezes. He has no rales.  Abdominal: Soft. He exhibits no distension. There is no tenderness. There is no rebound and no guarding.  Musculoskeletal: Normal range of motion. He exhibits no edema or tenderness.  Neurological: He is alert and oriented to person, place, and time. No cranial nerve deficit. He exhibits normal muscle tone. Coordination normal.  Skin: Skin is warm and dry. No rash noted. He is not diaphoretic.  Psychiatric: He has a normal mood and affect. His behavior is normal.  Nursing note and vitals reviewed.   ED Course  Procedures (including critical care time) Labs Review Labs Reviewed  CBC WITH DIFFERENTIAL - Abnormal; Notable for the following:    RBC 4.11 (*)    Hemoglobin 12.2 (*)    HCT 36.7 (*)    RDW 15.7 (*)    Platelets 85 (*)    Neutrophils Relative % 29 (*)    Neutro Abs 1.1 (*)    Monocytes Relative 19 (*)    Eosinophils Relative 16 (*)    All other components within normal limits  BASIC METABOLIC PANEL - Abnormal; Notable for the following:    Potassium 3.1 (*)    Chloride 95 (*)    BUN <5 (*)    Creatinine, Ser 3.38 (*)    GFR calc non Af Amer 17 (*)    GFR calc Af Amer 19 (*)    All other components within normal limits    Imaging Review Dg Chest 2 View  12/25/2014   CLINICAL DATA:  Weakness, tingling in both hands x3 weeks. Pt was at dialysis today and was advised to come to hospital.Hx of HTN, DM, CAD,  renal disorder, MI, anemia, dizziness.Nonsmoker.  EXAM: CHEST  2 VIEW  COMPARISON:  01/07/2013  FINDINGS: The heart is markedly enlarged. The lungs are free of focal consolidations. Suspect trace bilateral pleural effusions. No pulmonary edema.  IMPRESSION: 1. Cardiomegaly. 2. Suspect small bilateral pleural effusions.   Electronically Signed   By: Shon Hale M.D.   On: 12/25/2014 15:52     EKG Interpretation   Date/Time:  Wednesday December 25 2014 15:05:25 EST Ventricular Rate:  82 PR Interval:  148 QRS Duration: 80 QT Interval:  450 QTC Calculation: 525 R Axis:   -63 Text Interpretation:  Normal sinus rhythm Left atrial enlargement Left  axis deviation Inferior infarct , age undetermined Confirmed by KOHUT  MD,  [place] ([id number]) on 12/25/2014 3:16:44 PM      MDM   Final diagnoses:  Weakness  Paresthesias    The patient is a 73 y.o. M with ESRD on MWF dialysis who presents for 3 weeks of fatigue and intermittent  bilateral arm numbness and tingling. No SOB or Chest pain. Patient AFVSS, NVI. EKG shows NSR with no changes from previous studies. Chest xray is unremarkable and patient is breathing easily, normal pulmonary exam, and sating 98-100% on room air. Labs, ekg, chest xray unremarkable. Do not suspect ACS, Stroke, or other acute pathology. Patient reports not having a PCP and currently being out of his cozaar for HTN. Patient given 14 days of cozaar and contact information to establish care with a PCP. Patient expresses understanding and agreement with this plan and is discharged home with started return precautions.   Patient seen with attending, Dr. Wilson Singer, who oversaw clinical decision making.   Margaretann Loveless, MD 12/25/14 [place], MD 12/26/14 [phone]

## 2014-12-25 NOTE — ED Notes (Signed)
Pt to department via EMS- pt reports that he hade dialysis today and then began to have some numbness/tingling in his fingers on both sides and his legs. States that he has had the symptoms intermittently for the past 3 weeks. Pt also with generalized weakness. Bp-164/80 Hr-82 RR-16  CBG-79

## 2015-05-15 ENCOUNTER — Other Ambulatory Visit: Payer: Self-pay | Admitting: Orthopedic Surgery

## 2015-05-15 DIAGNOSIS — M545 Low back pain: Secondary | ICD-10-CM

## 2015-05-22 ENCOUNTER — Ambulatory Visit
Admission: RE | Admit: 2015-05-22 | Discharge: 2015-05-22 | Disposition: A | Discharge status: Home / Self Care | Payer: Medicare Other | Source: Ambulatory Visit | Attending: Orthopedic Surgery | Admitting: Orthopedic Surgery

## 2015-05-22 DIAGNOSIS — M545 Low back pain: Secondary | ICD-10-CM

## 2015-05-29 ENCOUNTER — Other Ambulatory Visit: Payer: Medicare Other

## 2015-06-04 ENCOUNTER — Emergency Department (HOSPITAL_COMMUNITY)
Admission: EM | Admit: 2015-06-04 | Discharge: 2015-06-04 | Disposition: A | Discharge status: Home / Self Care | Payer: Medicare Other | Attending: Emergency Medicine | Admitting: Emergency Medicine

## 2015-06-04 ENCOUNTER — Encounter (HOSPITAL_COMMUNITY): Payer: Self-pay | Admitting: Emergency Medicine

## 2015-06-04 DIAGNOSIS — Z862 Personal history of diseases of the blood and blood-forming organs and certain disorders involving the immune mechanism: Secondary | ICD-10-CM | POA: Insufficient documentation

## 2015-06-04 DIAGNOSIS — I120 Hypertensive chronic kidney disease with stage 5 chronic kidney disease or end stage renal disease: Secondary | ICD-10-CM | POA: Diagnosis not present

## 2015-06-04 DIAGNOSIS — Z7982 Long term (current) use of aspirin: Secondary | ICD-10-CM | POA: Diagnosis not present

## 2015-06-04 DIAGNOSIS — Z87891 Personal history of nicotine dependence: Secondary | ICD-10-CM | POA: Insufficient documentation

## 2015-06-04 DIAGNOSIS — T82590A Other mechanical complication of surgically created arteriovenous fistula, initial encounter: Secondary | ICD-10-CM

## 2015-06-04 DIAGNOSIS — I252 Old myocardial infarction: Secondary | ICD-10-CM | POA: Diagnosis not present

## 2015-06-04 DIAGNOSIS — T829XXA Unspecified complication of cardiac and vascular prosthetic device, implant and graft, initial encounter: Secondary | ICD-10-CM | POA: Insufficient documentation

## 2015-06-04 DIAGNOSIS — N186 End stage renal disease: Secondary | ICD-10-CM | POA: Insufficient documentation

## 2015-06-04 DIAGNOSIS — I2510 Atherosclerotic heart disease of native coronary artery without angina pectoris: Secondary | ICD-10-CM | POA: Diagnosis not present

## 2015-06-04 DIAGNOSIS — E119 Type 2 diabetes mellitus without complications: Secondary | ICD-10-CM | POA: Diagnosis not present

## 2015-06-04 DIAGNOSIS — Z79899 Other long term (current) drug therapy: Secondary | ICD-10-CM | POA: Insufficient documentation

## 2015-06-04 DIAGNOSIS — Y832 Surgical operation with anastomosis, bypass or graft as the cause of abnormal reaction of the patient, or of later complication, without mention of misadventure at the time of the procedure: Secondary | ICD-10-CM | POA: Diagnosis not present

## 2015-06-04 DIAGNOSIS — Z992 Dependence on renal dialysis: Secondary | ICD-10-CM | POA: Insufficient documentation

## 2015-06-04 DIAGNOSIS — Z8669 Personal history of other diseases of the nervous system and sense organs: Secondary | ICD-10-CM | POA: Diagnosis not present

## 2015-06-04 DIAGNOSIS — Z8719 Personal history of other diseases of the digestive system: Secondary | ICD-10-CM | POA: Insufficient documentation

## 2015-06-04 DIAGNOSIS — Z452 Encounter for adjustment and management of vascular access device: Secondary | ICD-10-CM | POA: Diagnosis present

## 2015-06-04 NOTE — ED Notes (Signed)
Fistula/graft site continues to not have any bleeding.

## 2015-06-04 NOTE — ED Notes (Signed)
Pt arrived by [facility] from [facility] kidney center with c/o bleeding fistula. Pt requested to only have half of dialysis administered and when they took him off dialysis, they were unable to get fistula to stop bleeding. There is a clamp in place that is controlling bleeding. Pt has no complaints.

## 2015-06-04 NOTE — ED Notes (Signed)
Bleeding from site started back after clamp removed. Finger pressure applied.

## 2015-06-04 NOTE — ED Provider Notes (Signed)
CSN: [id number]     Arrival date & time 06/04/15  1720 History   First MD Initiated Contact with Patient 06/04/15 1720     Chief Complaint  Patient presents with  . Vascular Access Problem     (Consider location/radiation/quality/duration/timing/severity/associated sxs/prior Treatment) HPI Comments: Patient is a 74 year old male with a past medical history of hypertension, ESRD on dialysis, CAD, and diabetes who presents from [facility] via EMS after his LUE graft site started bleeding and could not be controlled. Patient request to be dialyzed only half way through the treatment and when he was being removed from the treatment, the nurse could not control the bleeding. Patient denies pain or other associated symptoms. Patient's wound was dressed and pressure clamp applied to the site. No aggravating/alleviating factors. No other associated symptoms.    Past Medical History  Diagnosis Date  . Hypertension   . Renal disorder     esrd  . Coronary artery disease   . Myocardial infarction   . Anemia   . Diabetes mellitus     25 years ago  . Gastritis   . Renal dialysis status(V45.11)   . Occult blood in stools   . Shortness of breath   . Cough   . Dizziness   . Weakness generalized   . Insomnia    Past Surgical History  Procedure Laterality Date  . Esophagogastroduodenoscopy  07/27/2012    Procedure: ESOPHAGOGASTRODUODENOSCOPY (EGD);  Surgeon: Winfield Cunas., MD;  Location: [facility];  Service: Endoscopy;  Laterality: N/A;  . Colonoscopy  07/27/2012    Procedure: COLONOSCOPY;  Surgeon: Winfield Cunas., MD;  Location: [facility];  Service: Endoscopy;  Laterality: N/A;   No family history on file. History  Substance Use Topics  . Smoking status: Former Research scientist (life sciences)  . Smokeless tobacco: Not on file  . Alcohol Use: No    Review of Systems  Constitutional: Negative for fever, chills and fatigue.  HENT: Negative for trouble swallowing.   Eyes: Negative for  visual disturbance.  Respiratory: Negative for shortness of breath.   Cardiovascular: Negative for chest pain and palpitations.  Gastrointestinal: Negative for nausea, vomiting, abdominal pain and diarrhea.  Genitourinary: Negative for dysuria and difficulty urinating.  Musculoskeletal: Negative for arthralgias and neck pain.  Skin: Negative for color change.       Graft bleeding  Neurological: Negative for dizziness and weakness.  Psychiatric/Behavioral: Negative for dysphoric mood.      Allergies  Review of patient's allergies indicates no known allergies.  Home Medications   Prior to Admission medications   Medication Sig Start Date End Date Taking? Authorizing Provider  aspirin EC 81 MG tablet Take 81 mg by mouth daily.    Historical Provider, MD  calcium acetate (PHOSLO) 667 MG capsule Take 667-2,001 mg by mouth 3 (three) times daily with meals. Take 3 with meals and 1-2 with snacks    Historical Provider, MD  losartan (COZAAR) 100 MG tablet Take 50-100 mg by mouth at bedtime. Takes 1/2 a tablet if blood pressure is normal    Historical Provider, MD  losartan (COZAAR) 100 MG tablet Take 1 tablet (100 mg total) by mouth daily. 12/25/14   Margaretann Loveless, MD   BP 188/71 mmHg  Pulse 74  Temp(Src) 98 F (36.7 C) (Oral)  Resp 20  Ht 5\' 10"  (1.778 m)  Wt 163 lb 12.8 oz (74.3 kg)  BMI 23.50 kg/m2  SpO2 100% Physical Exam  Constitutional: He is oriented  to person, place, and time. He appears well-developed and well-nourished. No distress.  HENT:  Head: Normocephalic and atraumatic.  Eyes: Conjunctivae and EOM are normal.  Neck: Normal range of motion.  Cardiovascular: Normal rate and regular rhythm.  Exam reveals no gallop and no friction rub.   No murmur heard. Pulmonary/Chest: Effort normal and breath sounds normal. He has no wheezes. He has no rales. He exhibits no tenderness.  Abdominal: Soft. He exhibits no distension. There is no tenderness. There is no rebound.   Musculoskeletal: Normal range of motion.  Neurological: He is alert and oriented to person, place, and time. Coordination normal.  Speech is goal-oriented. Moves limbs without ataxia.   Skin: Skin is warm and dry.  LUE graft site oozing blood. No pulsatile bleeding. No open wound or ulcer noted.   Psychiatric: He has a normal mood and affect. His behavior is normal.  Nursing note and vitals reviewed.   ED Course  Procedures (including critical care time) Labs Review Labs Reviewed - No data to display  Imaging Review No results found.   EKG Interpretation None      MDM   Final diagnoses:  AV graft malfunction, initial encounter   6:49 PM Patient's graft has stopped bleeding after holding direct finger pressure, per Dr. Evelena Leyden advice. Vitals stable and patient afebrile.   7:38 PM Graft is no longer bleeding. Patient will be discharged without further evaluation.   Alvina Chou, PA-C 06/04/15 1943  Leonard Schwartz, MD 06/04/15 1949

## 2015-06-04 NOTE — ED Notes (Signed)
Bleeding from site has stopped EDPA notified.

## 2015-08-22 ENCOUNTER — Ambulatory Visit (HOSPITAL_COMMUNITY): Admission: RE | Admit: 2015-08-22 | Payer: Medicare Other | Source: Ambulatory Visit

## 2015-08-22 ENCOUNTER — Emergency Department (HOSPITAL_COMMUNITY): Payer: Medicare Other

## 2015-08-22 ENCOUNTER — Encounter (HOSPITAL_COMMUNITY): Payer: Self-pay | Admitting: *Deleted

## 2015-08-22 ENCOUNTER — Emergency Department (HOSPITAL_COMMUNITY)
Admission: EM | Admit: 2015-08-22 | Discharge: 2015-08-22 | Discharge status: Against Medical Advice | Payer: Medicare Other | Attending: Emergency Medicine | Admitting: Emergency Medicine

## 2015-08-22 DIAGNOSIS — Z992 Dependence on renal dialysis: Secondary | ICD-10-CM | POA: Insufficient documentation

## 2015-08-22 DIAGNOSIS — N186 End stage renal disease: Secondary | ICD-10-CM | POA: Diagnosis not present

## 2015-08-22 DIAGNOSIS — Z862 Personal history of diseases of the blood and blood-forming organs and certain disorders involving the immune mechanism: Secondary | ICD-10-CM | POA: Insufficient documentation

## 2015-08-22 DIAGNOSIS — I2510 Atherosclerotic heart disease of native coronary artery without angina pectoris: Secondary | ICD-10-CM | POA: Insufficient documentation

## 2015-08-22 DIAGNOSIS — Z7982 Long term (current) use of aspirin: Secondary | ICD-10-CM | POA: Diagnosis not present

## 2015-08-22 DIAGNOSIS — H532 Diplopia: Secondary | ICD-10-CM | POA: Insufficient documentation

## 2015-08-22 DIAGNOSIS — H02401 Unspecified ptosis of right eyelid: Secondary | ICD-10-CM | POA: Diagnosis not present

## 2015-08-22 DIAGNOSIS — I252 Old myocardial infarction: Secondary | ICD-10-CM | POA: Diagnosis not present

## 2015-08-22 DIAGNOSIS — H538 Other visual disturbances: Secondary | ICD-10-CM | POA: Diagnosis present

## 2015-08-22 DIAGNOSIS — Z87891 Personal history of nicotine dependence: Secondary | ICD-10-CM | POA: Diagnosis not present

## 2015-08-22 DIAGNOSIS — E119 Type 2 diabetes mellitus without complications: Secondary | ICD-10-CM | POA: Diagnosis not present

## 2015-08-22 DIAGNOSIS — R51 Headache: Secondary | ICD-10-CM

## 2015-08-22 DIAGNOSIS — Z8719 Personal history of other diseases of the digestive system: Secondary | ICD-10-CM | POA: Diagnosis not present

## 2015-08-22 DIAGNOSIS — I120 Hypertensive chronic kidney disease with stage 5 chronic kidney disease or end stage renal disease: Secondary | ICD-10-CM | POA: Diagnosis not present

## 2015-08-22 DIAGNOSIS — I160 Hypertensive urgency: Secondary | ICD-10-CM

## 2015-08-22 DIAGNOSIS — Z79899 Other long term (current) drug therapy: Secondary | ICD-10-CM | POA: Diagnosis not present

## 2015-08-22 DIAGNOSIS — R519 Headache, unspecified: Secondary | ICD-10-CM

## 2015-08-22 LAB — CBC WITH DIFFERENTIAL/PLATELET
Basophils Absolute: 0.1 10*3/uL (ref 0.0–0.1)
Basophils Relative: 2 % — ABNORMAL HIGH (ref 0–1)
Eosinophils Absolute: 1 10*3/uL — ABNORMAL HIGH (ref 0.0–0.7)
Eosinophils Relative: 20 % — ABNORMAL HIGH (ref 0–5)
HEMATOCRIT: 39.7 % (ref 39.0–52.0)
Hemoglobin: 13.2 g/dL (ref 13.0–17.0)
LYMPHS ABS: 1.9 10*3/uL (ref 0.7–4.0)
Lymphocytes Relative: 38 % (ref 12–46)
MCH: 29.6 pg (ref 26.0–34.0)
MCHC: 33.2 g/dL (ref 30.0–36.0)
MCV: 89 fL (ref 78.0–100.0)
MONO ABS: 0.7 10*3/uL (ref 0.1–1.0)
MONOS PCT: 14 % — AB (ref 3–12)
NEUTROS ABS: 1.4 10*3/uL — AB (ref 1.7–7.7)
Neutrophils Relative %: 27 % — ABNORMAL LOW (ref 43–77)
Platelets: 108 10*3/uL — ABNORMAL LOW (ref 150–400)
RBC: 4.46 MIL/uL (ref 4.22–5.81)
RDW: 15.8 % — AB (ref 11.5–15.5)
WBC: 5.1 10*3/uL (ref 4.0–10.5)

## 2015-08-22 LAB — COMPREHENSIVE METABOLIC PANEL
ALBUMIN: 3.3 g/dL — AB (ref 3.5–5.0)
ALK PHOS: 75 U/L (ref 38–126)
ALT: 13 U/L — ABNORMAL LOW (ref 17–63)
ANION GAP: 13 (ref 5–15)
AST: 23 U/L (ref 15–41)
BILIRUBIN TOTAL: 0.7 mg/dL (ref 0.3–1.2)
BUN: 13 mg/dL (ref 6–20)
CALCIUM: 9.5 mg/dL (ref 8.9–10.3)
CO2: 27 mmol/L (ref 22–32)
Chloride: 93 mmol/L — ABNORMAL LOW (ref 101–111)
Creatinine, Ser: 8.72 mg/dL — ABNORMAL HIGH (ref 0.61–1.24)
GFR, EST AFRICAN AMERICAN: 6 mL/min — AB (ref >60)
GFR, EST NON AFRICAN AMERICAN: 5 mL/min — AB (ref >60)
GLUCOSE: 83 mg/dL (ref 65–99)
POTASSIUM: 3.4 mmol/L — AB (ref 3.5–5.1)
Sodium: 133 mmol/L — ABNORMAL LOW (ref 135–145)
Total Protein: 7.8 g/dL (ref 6.5–8.1)

## 2015-08-22 LAB — I-STAT TROPONIN, ED: Troponin i, poc: 0.03 ng/mL (ref 0.00–0.08)

## 2015-08-22 LAB — C-REACTIVE PROTEIN

## 2015-08-22 LAB — PROTIME-INR
INR: 1.2 (ref 0.00–1.49)
PROTHROMBIN TIME: 15.4 s — AB (ref 11.6–15.2)

## 2015-08-22 LAB — SEDIMENTATION RATE: SED RATE: 4 mm/h (ref 0–16)

## 2015-08-22 LAB — ETHANOL

## 2015-08-22 LAB — APTT: aPTT: 36 seconds (ref 24–37)

## 2015-08-22 NOTE — ED Notes (Signed)
Patient transported to MRI 

## 2015-08-22 NOTE — ED Notes (Signed)
Pt anuric, unable to provide urine specimen

## 2015-08-22 NOTE — ED Notes (Addendum)
Sent from [place] for double vision by way of dialysis. Due for dialysis today, they were not comfortable dialyzing patient until this was addressed. 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  Double vision x 3 weeks, has been having dialysis throughout this time.

## 2015-08-22 NOTE — ED Notes (Signed)
Patient transported back from MRI. 

## 2015-08-22 NOTE — ED Provider Notes (Signed)
CSN: [id number]     Arrival date & time 08/22/15  1514 History   First MD Initiated Contact with Patient 08/22/15 1525     Chief Complaint  Patient presents with  . Blurred Vision     (Consider location/radiation/quality/duration/timing/severity/associated sxs/prior Treatment) HPI   74 year old male with history of end-stage renal disease currently on MWF dialysis, MI, CAD, diabetes and sent here from [place] for evaluation of new onset diplopia.  Pt has acute onset of vision changes.  Patient report 3 weeks ago he developed acute onset of double vision in left eye only. Symptom has been persistent since. 4 days ago he developed a droop in his right upper eyelid which has became progressively worse. He noticed diplopia to right eye when lid is lifted but normal vision when lid is drooped. For the past 4 days he also has intermittent right temporal headache in which she described as a pressure sensation, rates pain as 7 out of 10. Report unsteady of gait for more than 6 months using a walker to walk and also noticed increased forgetfulness within the past 3 months. He denies any specific treatment tried, stating that he was hoping that the symptom will resolve but it can progressively worse. No active headache at this time. He did follow-up with an eye specialist today for his complaint and was recommended to come to the ER for further workup including brain MRI to rule out orbital pathology, cavernous sinus pathology, or evidence of stroke either brain or brainstem. It was also recommended for patient to be worked up for possible giant cell arteritis with blood work including ESR, CRP, and CBC with platelets. He was supposed to have dialysis today but missing his dialysis to be in the ER. Last as this was 2 days ago. Otherwise patient denies fever, chills, nasal congestion, runny nose, sneezing, coughing, chest pain, shortness of breath, abdominal pain, nausea vomiting diarrhea, focal  numbness or weakness. No light/sound sensitivity. No photophobia or phonophobia.  Pt wears prescription glasses.    Past Medical History  Diagnosis Date  . Hypertension   . Renal disorder     esrd  . Coronary artery disease   . Myocardial infarction   . Anemia   . Diabetes mellitus     25 years ago  . Gastritis   . Renal dialysis status(V45.11)   . Occult blood in stools   . Shortness of breath   . Cough   . Dizziness   . Weakness generalized   . Insomnia    Past Surgical History  Procedure Laterality Date  . Esophagogastroduodenoscopy  07/27/2012    Procedure: ESOPHAGOGASTRODUODENOSCOPY (EGD);  Surgeon: Winfield Cunas., MD;  Location: [facility];  Service: Endoscopy;  Laterality: N/A;  . Colonoscopy  07/27/2012    Procedure: COLONOSCOPY;  Surgeon: Winfield Cunas., MD;  Location: [facility];  Service: Endoscopy;  Laterality: N/A;   History reviewed. No pertinent family history. Social History  Substance Use Topics  . Smoking status: Former Research scientist (life sciences)  . Smokeless tobacco: None  . Alcohol Use: No    Review of Systems  All other systems reviewed and are negative.     Allergies  Review of patient's allergies indicates no known allergies.  Home Medications   Prior to Admission medications   Medication Sig Start Date End Date Taking? Authorizing Provider  aspirin EC 81 MG tablet Take 81 mg by mouth daily.    Historical Provider, MD  calcium acetate (PHOSLO) 667 MG  capsule Take 667-2,001 mg by mouth 3 (three) times daily with meals. Take 3 with meals and 1-2 with snacks    Historical Provider, MD  losartan (COZAAR) 100 MG tablet Take 1 tablet (100 mg total) by mouth daily. 12/25/14   Margaretann Loveless, MD   SpO2 98% Physical Exam  Constitutional: He appears well-developed and well-nourished. No distress.  AAM sitting in bed wearing glasses, in NAD.    HENT:  Head: Atraumatic.  Eyes: Conjunctivae are normal. Lids are everted and swept, no foreign bodies found. Right  eye exhibits abnormal extraocular motion. Right eye exhibits no nystagmus. Left eye exhibits normal extraocular motion and no nystagmus. Right pupil is round and reactive. Left pupil is round and reactive.  Ptosis of R eyelid.  Report diplopia to both eyes but able to detect number of fingers correctly at a distance of 2 feet away from each eyes.    Visual Acuity: From ophthalmologist office today R eye 20/200 L eye 20/70  IOP 19 to both eyes  Neck: Neck supple.  Neurological: He is alert. He has normal strength. No cranial nerve deficit or sensory deficit. Coordination (poor finger-to-nose coordination due to visual pathology) abnormal. Abnormal gait: gait not tested. GCS eye subscore is 4. GCS verbal subscore is 5. GCS motor subscore is 6.  Skin: No rash noted.  Psychiatric: He has a normal mood and affect.  Nursing note and vitals reviewed.   ED Course  Procedures (including critical care time)  Patient with history of MI, diabetes, CAD eye specialist for further evaluation of his diplopia and ptosis of eyelid. Eye specialist was concern for possible stroke versus giant cell arteritis versus cavernous sinus thrombosis vs. Orbital mass lesion.  Work up initiated. Patient is hypertensive with systolic blood pressure above 200.  Pt did miss his scheduled dialysis today, Will check labs and will consult nephrologist if indicated.    7:02 PM MRI shows atrophy and mild chronic microvascular ischemic change. No acute abnormality. CRP is normal, sedimentation rate has not resulted yet. Normal INR. Evidence of renal failure with creatinine 8.72, normal potassium. The remainder of his labs are reassuring. Appreciate consultation from neurologist Dr. Leonel Ramsay who will see patient in the ER and will determine further management. Care discussed with Dr. Winfred Leeds who has seen and evaluate pt.    7:55 PM Appreciate consultation with pt's dialysis specialist Dr. Marval Regal who agrees to get pt into  dialysis tomorrow as he missed his appointment.  Neurologist Dr. Janann Colonel has seen and evaluate pt in ER and recommend head CT angio and Sed Rate for further evaluation.  Pt and wife however does not want to stay any longer and request to be discharge.  Pt made aware that he will have to leave AMA.  Pt understand the risk including undiagnosed stroke, ruptured aneurysm, worsening condition and potential death.    Labs Review Labs Reviewed  CBC WITH DIFFERENTIAL/PLATELET - Abnormal; Notable for the following:    RDW 15.8 (*)    Platelets 108 (*)    Neutrophils Relative % 27 (*)    Neutro Abs 1.4 (*)    Monocytes Relative 14 (*)    Eosinophils Relative 20 (*)    Eosinophils Absolute 1.0 (*)    Basophils Relative 2 (*)    All other components within normal limits  COMPREHENSIVE METABOLIC PANEL - Abnormal; Notable for the following:    Sodium 133 (*)    Potassium 3.4 (*)    Chloride 93 (*)  Creatinine, Ser 8.72 (*)    Albumin 3.3 (*)    ALT 13 (*)    GFR calc non Af Amer 5 (*)    GFR calc Af Amer 6 (*)    All other components within normal limits  PROTIME-INR - Abnormal; Notable for the following:    Prothrombin Time 15.4 (*)    All other components within normal limits  C-REACTIVE PROTEIN  ETHANOL  APTT  SEDIMENTATION RATE  URINE RAPID DRUG SCREEN, HOSP PERFORMED  URINALYSIS, ROUTINE W REFLEX MICROSCOPIC (NOT AT [facility])  Randolm Idol, ED    Imaging Review Mr Brain Wo Contrast  08/22/2015   CLINICAL DATA:  Headache around eyes. Right ptosis. Dialysis patient  EXAM: MRI HEAD WITHOUT CONTRAST  TECHNIQUE: Multiplanar, multiecho pulse sequences of the brain and surrounding structures were obtained without intravenous contrast.  COMPARISON:  None.  FINDINGS: Moderate atrophy with prominent subarachnoid space and ventricles. Negative for hydrocephalus. Pituitary not enlarged.  Negative for acute infarct. Mild chronic microvascular ischemic change in the white matter and pons.  Negative  for intracranial hemorrhage.  Negative for mass or edema.  Negative for mass lesion in the skullbase.  Mild mucosal edema in the paranasal sinuses. The orbit appears normal bilaterally. Bilateral lens extraction.  IMPRESSION: Atrophy and mild chronic microvascular ischemic change. No acute abnormality.   Electronically Signed   By: Franchot Gallo M.D.   On: 08/22/2015 17:15   I have personally reviewed and evaluated these images and lab results as part of my medical decision-making.   EKG Interpretation   Date/Time:  Friday August 22 2015 15:25:54 EDT Ventricular Rate:  79 PR Interval:  193 QRS Duration: 93 QT Interval:  444 QTC Calculation: 509 R Axis:   -67 Text Interpretation:  Sinus rhythm LAE, consider biatrial enlargement  Inferior infarct, old new Prolonged QT interval Confirmed by Maryan Rued  MD,  Loree Fee ([id number]) on 08/22/2015 3:39:29 PM      MDM   Final diagnoses:  Diplopia  Ptosis, right  Hypertensive urgency  Ptosis of eyelid, right    BP 197/70 mmHg  Pulse 77  Resp 16  SpO2 100%   I have reviewed nursing notes and vital signs. I personally viewed the imaging tests through PACS system and agrees with radiologist's intepretation I reviewed available ER/hospitalization records through the EMR   Domenic Moras, PA-C 08/22/15 [place], MD 08/23/15 [phone]

## 2015-08-22 NOTE — ED Notes (Signed)
Unsuccessful IV attempt x 2, phlebotomy to come for lab draw

## 2015-08-22 NOTE — Consult Note (Signed)
Consult Reason for Consult:blurred vision Referring Physician: Domenic Moras [facility] ED  CC: blurred vision  HPI: Ronald Brewer is an 74 y.o. male hx of ESRD on HD, MI, CAD, DM sent from [place] for evaluation of new onset diplopia. Symptoms began 3 weeks ago with acute onset of double vision (reports only in left eye). 4 days ago developed a droop in his right upper eyelid which has gotten progressively worse. At that time also developed a right temporal headache described as a pressure type pain, 7/10 at its worst. Denies any extremity weakness or sensory deficits.   MRI brain imaging completed and reviewed, is overall unremarkable. CRP is negative. ESR negative. BP elevated up to 208/81.   Past Medical History  Diagnosis Date  . Hypertension   . Renal disorder     esrd  . Coronary artery disease   . Myocardial infarction   . Anemia   . Diabetes mellitus     25 years ago  . Gastritis   . Renal dialysis status(V45.11)   . Occult blood in stools   . Shortness of breath   . Cough   . Dizziness   . Weakness generalized   . Insomnia     Past Surgical History  Procedure Laterality Date  . Esophagogastroduodenoscopy  07/27/2012    Procedure: ESOPHAGOGASTRODUODENOSCOPY (EGD);  Surgeon: Winfield Cunas., MD;  Location: [facility];  Service: Endoscopy;  Laterality: N/A;  . Colonoscopy  07/27/2012    Procedure: COLONOSCOPY;  Surgeon: Winfield Cunas., MD;  Location: [facility];  Service: Endoscopy;  Laterality: N/A;    History reviewed. No pertinent family history.  Social History:  reports that he has quit smoking. He does not have any smokeless tobacco history on file. He reports that he does not drink alcohol or use illicit drugs.  Allergies  Allergen Reactions  . Tape Itching and Rash    Please use "paper" tape only.    Medications: I have reviewed the patient's current medications.  ROS: Out of a complete 14 system review, the patient complains of only the following  symptoms, and all other reviewed systems are negative. + headache, vision problems  Physical Examination: Filed Vitals:   08/22/15 1815  BP: 197/70  Pulse: 77  Resp:    Physical Exam  Constitutional: He appears well-developed and well-nourished.  Psych: Affect appropriate to situation Eyes: No scleral injection HENT: No OP obstrucion Head: Normocephalic. Mild temporal tenderness to palpation on right side Cardiovascular: Normal rate and regular rhythm.  Respiratory: Effort normal and breath sounds normal.  GI: Soft. Bowel sounds are normal. No distension. There is no tenderness.  Skin: WDI  Neurologic Examination Mental Status: Alert, oriented, thought content appropriate.  Speech fluent without evidence of aphasia.  Able to follow 3 step commands without difficulty. Cranial Nerves: II: unable to fully visualize optic discs, constricted VF bilaterally, left pupil 58m and reactive, right pupil 464mand not reactive III,IV, VI: ptosis present on right side, impaired medial and lateral movement of right eye V,VII: smile symmetric, facial light touch sensation normal bilaterally VIII: hearing normal bilaterally IX,X: gag reflex present XI: trapezius strength/neck flexion strength normal bilaterally XII: tongue strength normal  Motor: Right : Upper extremity    Left:     Upper extremity 5/5 deltoid       5/5 deltoid 5/5 biceps      5/5 biceps  5/5 triceps      5/5 triceps 5/5 hand grip  5/5 hand grip  Lower extremity     Lower extremity 5/5 hip flexor      5/5 hip flexor 5/5 quadricep      5/5 quadriceps  5/5 hamstrings     5/5 hamstrings 5/5 plantar flexion       5/5 plantar flexion 5/5 plantar extension     5/5 plantar extension Tone and bulk:normal tone throughout; no atrophy noted Sensory: Pinprick and light touch intact throughout, bilaterally Deep Tendon Reflexes: 1+ and symmetric throughout Plantars: Right: downgoing   Left: downgoing Cerebellar: normal  finger-to-nose, and normal heel-to-shin test Gait: deferred  Laboratory Studies:   Basic Metabolic Panel:  Recent Labs Lab 08/22/15 1735  NA 133*  K 3.4*  CL 93*  CO2 27  GLUCOSE 83  BUN 13  CREATININE 8.72*  CALCIUM 9.5    Liver Function Tests:  Recent Labs Lab 08/22/15 1735  AST 23  ALT 13*  ALKPHOS 75  BILITOT 0.7  PROT 7.8  ALBUMIN 3.3*   No results for input(s): LIPASE, AMYLASE in the last 168 hours. No results for input(s): AMMONIA in the last 168 hours.  CBC:  Recent Labs Lab 08/22/15 1735  WBC 5.1  NEUTROABS 1.4*  HGB 13.2  HCT 39.7  MCV 89.0  PLT 108*    Cardiac Enzymes: No results for input(s): CKTOTAL, CKMB, CKMBINDEX, TROPONINI in the last 168 hours.  BNP: Invalid input(s): POCBNP  CBG: No results for input(s): GLUCAP in the last 168 hours.  Microbiology: Results for orders placed or performed during the hospital encounter of 05/07/11  Culture, blood (routine x 2)     Status: None   Collection Time: 05/07/11  1:15 PM  Result Value Ref Range Status   Specimen Description BLOOD ARM RIGHT  Final   Special Requests BOTTLES DRAWN AEROBIC AND ANAEROBIC 10CC  Final   Culture  Setup Time [id number]  Final   Culture NO GROWTH 5 DAYS  Final   Report Status 05/13/2011 FINAL  Final  Culture, blood (routine x 2)     Status: None   Collection Time: 05/07/11  2:25 PM  Result Value Ref Range Status   Specimen Description BLOOD RIGHT HAND  Final   Special Requests BOTTLES DRAWN AEROBIC ONLY [place]  Final   Culture  Setup Time [id number]  Final   Culture NO GROWTH 5 DAYS  Final   Report Status 05/13/2011 FINAL  Final  MRSA PCR Screening     Status: None   Collection Time: 05/07/11 11:04 PM  Result Value Ref Range Status   MRSA by PCR  NEGATIVE Final    NEGATIVE        The GeneXpert MRSA Assay (FDA approved for NASAL specimens only), is one component of a comprehensive MRSA colonization surveillance program. It is not intended to diagnose  MRSA infection nor to guide or monitor treatment for MRSA infections.  Urine culture     Status: None   Collection Time: 05/08/11  5:29 PM  Result Value Ref Range Status   Specimen Description URINE, CLEAN CATCH  Final   Special Requests NONE  Final   Culture  Setup Time [id number]  Final   Colony Count 10,000 COLONIES/ML  Final   Culture   Final    Multiple bacterial morphotypes present, none predominant. Suggest appropriate recollection if clinically indicated.   Report Status 05/10/2011 FINAL  Final    Coagulation Studies:  Recent Labs  08/22/15 1735  LABPROT 15.4*  INR 1.20    Urinalysis:  No results for input(s): COLORURINE, LABSPEC, PHURINE, GLUCOSEU, HGBUR, BILIRUBINUR, KETONESUR, PROTEINUR, UROBILINOGEN, NITRITE, LEUKOCYTESUR in the last 168 hours.  Invalid input(s): APPERANCEUR  Lipid Panel:     Component Value Date/Time   CHOL 140 05/08/2011 0219   TRIG 134 05/08/2011 0219   HDL 44 05/08/2011 0219   CHOLHDL 3.2 05/08/2011 0219   VLDL 27 05/08/2011 0219   LDLCALC  05/08/2011 0219    69        Total Cholesterol/HDL:CHD Risk Coronary Heart Disease Risk Table                     Men   Women  1/2 Average Risk   3.4   3.3  Average Risk       5.0   4.4  2 X Average Risk   9.6   7.1  3 X Average Risk  23.4   11.0        Use the calculated Patient Ratio above and the CHD Risk Table to determine the patient's CHD Risk.        ATP III CLASSIFICATION (LDL):  <100     mg/dL   Optimal  100-129  mg/dL   Near or Above                    Optimal  130-159  mg/dL   Borderline  160-189  mg/dL   High  >190     mg/dL   Very High    HgbA1C: No results found for: HGBA1C  Urine Drug Screen:  No results found for: LABOPIA, COCAINSCRNUR, LABBENZ, AMPHETMU, THCU, LABBARB  Alcohol Level:  Recent Labs Lab 08/22/15 [place] <5     Imaging: Mr Brain Wo Contrast  08/22/2015   CLINICAL DATA:  Headache around eyes. Right ptosis. Dialysis patient  EXAM: MRI HEAD  WITHOUT CONTRAST  TECHNIQUE: Multiplanar, multiecho pulse sequences of the brain and surrounding structures were obtained without intravenous contrast.  COMPARISON:  None.  FINDINGS: Moderate atrophy with prominent subarachnoid space and ventricles. Negative for hydrocephalus. Pituitary not enlarged.  Negative for acute infarct. Mild chronic microvascular ischemic change in the white matter and pons.  Negative for intracranial hemorrhage.  Negative for mass or edema.  Negative for mass lesion in the skullbase.  Mild mucosal edema in the paranasal sinuses. The orbit appears normal bilaterally. Bilateral lens extraction.  IMPRESSION: Atrophy and mild chronic microvascular ischemic change. No acute abnormality.   Electronically Signed   By: Franchot Gallo M.D.   On: 08/22/2015 17:15     Assessment/Plan:  74y/o gentleman history of ESRD on HD, MI, CAD, DM sent from eye doctor for evaluation of right eye ptosis and blurred vision. Exam pertinent for dilated right pupil, right sided ptosis and impaired lateral and medial movement of right eye. MRI brain negative. ESR and CRP unremarkable. With dilated pupil will need to rule out aneurysm. Differential also includes microvascular disease.   -CT angiogram of head -check TSH, myasthenia panel, ACE level -BP control per ED. Will need PCP follow up -if CT angiogram negative for aneurysm no further inpatient neurological workup required. Can follow up with PCP for lab results   Jim Like, DO [facility] [phone]  If 7pm- 7am, please page neurology on call as listed in [place]. 08/22/2015, 7:14 PM

## 2015-08-22 NOTE — Discharge Instructions (Signed)
Acknowledgement of Risk of Discharge    Against Medical Advice     And Release of Liability        Because I am choosing to leave the hospital in spite of these risks, I release the hospital, its employees and officers, and my attending physician from all liability for any adverse results caused by my leaving the hospital prematurely.      ________________________________      _____________________________________  Patient's Signature                                        Date and Time      ________________________________      _____________________________________  Witness' Signature                                        Relationship to Patient        ________________________________      _____________________________________  Witness' Signature                                        Relationship to Patient      [If patient refuses to sign, write "Patient refused to sign" on the patient's signature line and have witnesses to the refusal sign as witnesses.]

## 2015-08-22 NOTE — ED Provider Notes (Signed)
Patient with diplopia for the past 3 weeks accompanied by right-sided temporal headache and right-sided ptosis. Denies shortness of breath. No other associated symptoms. Value by [facility] today sent here for further evaluation. Dr. Janann Colonel feel stroke unlikely. Patient has cranial nerve VI and 3 deficits on right side  Orlie Dakin, MD 08/22/15 1944

## 2015-09-18 ENCOUNTER — Encounter: Payer: Self-pay | Admitting: Neurology

## 2015-09-18 ENCOUNTER — Ambulatory Visit (INDEPENDENT_AMBULATORY_CARE_PROVIDER_SITE_OTHER): Payer: Medicare Other | Admitting: Neurology

## 2015-09-18 VITALS — BP 126/64 | HR 74 | Resp 16 | Ht 70.0 in | Wt 157.4 lb

## 2015-09-18 DIAGNOSIS — G441 Vascular headache, not elsewhere classified: Secondary | ICD-10-CM | POA: Diagnosis not present

## 2015-09-18 DIAGNOSIS — Z992 Dependence on renal dialysis: Secondary | ICD-10-CM

## 2015-09-18 DIAGNOSIS — H532 Diplopia: Secondary | ICD-10-CM | POA: Insufficient documentation

## 2015-09-18 DIAGNOSIS — I6350 Cerebral infarction due to unspecified occlusion or stenosis of unspecified cerebral artery: Secondary | ICD-10-CM | POA: Diagnosis not present

## 2015-09-18 DIAGNOSIS — R51 Headache: Secondary | ICD-10-CM

## 2015-09-18 DIAGNOSIS — H4901 Third [oculomotor] nerve palsy, right eye: Secondary | ICD-10-CM | POA: Insufficient documentation

## 2015-09-18 DIAGNOSIS — G629 Polyneuropathy, unspecified: Secondary | ICD-10-CM

## 2015-09-18 DIAGNOSIS — R519 Headache, unspecified: Secondary | ICD-10-CM | POA: Insufficient documentation

## 2015-09-18 DIAGNOSIS — N186 End stage renal disease: Secondary | ICD-10-CM

## 2015-09-18 DIAGNOSIS — I639 Cerebral infarction, unspecified: Secondary | ICD-10-CM | POA: Insufficient documentation

## 2015-09-18 NOTE — Progress Notes (Signed)
[facility]  PATIENT: Ronald Brewer DOB: 09-Jan-1941  REFERRING DOCTOR OR PCP:  Tama High (ophthalmology)  Dr. Ernst Breach (Renal) SOURCE: patient, spouse, records from Dr. Apolinar Junes and Manuella Ghazi, ER notes, MRI images,labs  _________________________________   HISTORICAL  CHIEF COMPLAINT:  Chief Complaint  Patient presents with  . Diplopia    Sts. onset about 2 mos. ago of drooping right eyelid.  He can open eye just a very small bit by himself.  Tearing from that eye, and sts. when right eye is open he has double vision.  Sts. he was eval at [facility] ED but no clear dx. reached/fim  . Ptosis    Wife sts. she believes current problems are related to cataract removal in 2013.  She is very upset, sts. she believes there was nerve damage related to the cataract removal./fim    HISTORY OF PRESENT ILLNESS:  I had the pleasure of seeing your patient, Ronald Brewer, at [facility] for a consultation regarding his ptosis and diplopia.    In late July or early August, he had the onset of ptosis that he noted when he woke up one day.  When symptoms persisted at his dialysis visits, he was refer to ophthalmology. At that time, there is a report that he had ptosis and complete ophthalmoplegia of the right eye.told to go to [facility].  In the ER, he had an MRI of the brain without contrast showing mild atrophy and microvascular changes.   No acute findings.     Initially, he was having pain in the right side of his head near the eye but this has improved over the last 2 weeks.  He had a cataract removed 2013 and the wife believes that the eye problems started then.     He has a h/o tobacco abuse (2 ppd x 25 years when younger and has had hypertension x many years.   He was diagnosed with DM and treated x 15 years (pills x 10 years and insulin x 5 years).  he lost 100 pounds and no longer needed treatment.    He has been on aspirin x 20 years.   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     He denies other symptoms involving other cranial nerves. He denies any new numbness or weakness in the arms or legs. There has not been any change in speech.   No trauma or loss of consciousness.  He has had some depression.  He notes mild numbness and tingling in his toes but this is not uncomfortable.  I personally reviewed the MRI of the brain performed at [facility] 08/22/2015.  It shows mild chronic microvascular ischemic changes, slightly more than typical for age and atrophy, also slightly more than typical for age. There were no acute findings,.  Due to his kidney disease, the study was not contrasted  REVIEW OF SYSTEMS: Constitutional: No fevers, chills, sweats, or change in appetite Eyes: as above Ear, nose and throat: No hearing loss, ear pain, nasal congestion, sore throat Cardiovascular: No chest pain, palpitations Respiratory: No shortness of breath at rest or with exertion.   No wheezes GastrointestinaI: No nausea, vomiting, diarrhea, abdominal pain, fecal incontinence Genitourinary: No dysuria, urinary retention or frequency.  No nocturia. Musculoskeletal: No neck pain, back pain Integumentary: No rash, pruritus, skin lesions Neurological: as above Psychiatric: Notes some depression .  No anxiety Endocrine: No palpitations, diaphoresis, change in appetite, change in weigh or increased thirst  Hematologic/Lymphatic: No anemia, purpura, petechiae. Allergic/Immunologic: No itchy/runny eyes, nasal congestion, recent allergic reactions, rashes  ALLERGIES: Allergies  Allergen Reactions  . Tape Itching and Rash    Please use "paper" tape only.    HOME MEDICATIONS:  Current outpatient prescriptions:  .  amLODipine (NORVASC) 5 MG tablet, Take 10 mg by mouth at bedtime. , Disp: , Rfl:  .  aspirin EC 81 MG tablet, Take 81 mg by mouth daily., Disp: , Rfl:  .  B Complex-C-Folic Acid (NEPHRO-VITE PO), Take by mouth., Disp: , Rfl:  .  calcium  acetate (PHOSLO) 667 MG capsule, Take 2,668 mg by mouth 3 (three) times daily with meals. , Disp: , Rfl:  .  dextromethorphan-guaiFENesin (MUCINEX DM) 30-600 MG per 12 hr tablet, Take 1 tablet by mouth 2 (two) times daily as needed for cough., Disp: , Rfl:  .  losartan (COZAAR) 100 MG tablet, Take 1 tablet (100 mg total) by mouth daily. (Patient taking differently: Take 100 mg by mouth at bedtime. ), Disp: 14 tablet, Rfl: 0 .  megestrol (MEGACE) 400 MG/10ML suspension, Take 400 mg by mouth daily., Disp: , Rfl:  .  Multiple Vitamins-Minerals (DIALYVITE 800/ULTRA D PO), Take 1 tablet by mouth daily., Disp: , Rfl:  .  omeprazole (PRILOSEC) 20 MG capsule, Take 20 mg by mouth daily., Disp: , Rfl:  .  zolpidem (AMBIEN) 5 MG tablet, Take 5 mg by mouth at bedtime as needed for sleep., Disp: , Rfl:  .  sertraline (ZOLOFT) 50 MG tablet, , Disp: , Rfl:   PAST MEDICAL HISTORY: Past Medical History  Diagnosis Date  . Hypertension   . Renal disorder     esrd  . Coronary artery disease   . Myocardial infarction   . Anemia   . Diabetes mellitus     25 years ago  . Gastritis   . Renal dialysis status(V45.11)   . Occult blood in stools   . Shortness of breath   . Cough   . Dizziness   . Weakness generalized   . Insomnia     PAST SURGICAL HISTORY: Past Surgical History  Procedure Laterality Date  . Esophagogastroduodenoscopy  07/27/2012    Procedure: ESOPHAGOGASTRODUODENOSCOPY (EGD);  Surgeon: Winfield Cunas., MD;  Location: [facility];  Service: Endoscopy;  Laterality: N/A;  . Colonoscopy  07/27/2012    Procedure: COLONOSCOPY;  Surgeon: Winfield Cunas., MD;  Location: [facility];  Service: Endoscopy;  Laterality: N/A;    FAMILY HISTORY: History reviewed. No pertinent family history.  SOCIAL HISTORY:  Social History   Social History  . Marital Status: Married    Spouse Name: N/A  . Number of Children: N/A  . Years of Education: N/A   Occupational History  . Not on file.    Social History Main Topics  . Smoking status: Former Research scientist (life sciences)  . Smokeless tobacco: Not on file  . Alcohol Use: No  . Drug Use: No  . Sexual Activity: Not on file   Other Topics Concern  . Not on file   Social History Narrative     PHYSICAL EXAM  Filed Vitals:   09/18/15 1047  BP: 126/64  Pulse: 74  Resp: 16  Height: 5\' 10"  (1.778 m)  Weight: 157 lb 6.4 oz (71.396 kg)    Body mass index is 22.58 kg/(m^2).   General: The patient is well-developed and well-nourished and in no acute distress  Eyes:  Funduscopic exam shows normal optic discs and retinal vessels.  Neck: The neck is supple, no carotid bruits are noted.  The neck is nontender.  Cardiovascular: The heart has a regular rate and rhythm with a normal S1 and S2. There were no murmurs, gallops or rubs. Lungs are clear to auscultation.  Skin: Extremities are without sores.  Musculoskeletal:  Back is nontender  Neurologic Exam  Mental status: The patient is alert and oriented x 3 at the time of the examination. The patient has apparent normal recent and remote memory, with an apparently normal attention span and concentration ability.   Speech is normal.  Cranial nerves: Extraocular movements are full in the left eye and no ptosis.  On the right he has no adduction or upgaze and very reduced downgaze and mildly reduced abduction and intact intorsion but no extorsion.  Pupils are equal, round, and reactive to light and accomodation. He reports diplopia in all gazes. Visual fields are full. Visual acuity appeared reasonably good out of both eyes. Facial symmetry is present. There is good facial sensation to soft touch bilaterally.Facial strength is normal.  Trapezius and sternocleidomastoid strength is normal. No dysarthria is noted.  The tongue is midline, and the patient has symmetric elevation of the soft palate. No obvious hearing deficits are noted.  Motor:  Muscle bulk is normal.   Tone is normal. Strength is  5  / 5 in all 4 extremities except for 4/5 extensor hallucis longus bilaterally  Sensory: Sensory testing is intact to pinprick, soft touch and vibration sensation in arms and proximal legs. He has a gradient of decrease vibratory, touch and temperature sensation in the feet to just above the ankles.  Coordination: Cerebellar testing reveals good finger-nose-finger and heel-to-shin bilaterally.  Gait and station: Station is normal.   Gait is mildly wide and he canot tandem.   Romberg is borderlines.   Reflexes: Deep tendon reflexes are symmetric and normal bilaterally in arms, trace at the knees and absent at the ankles.   Plantar responses are flexor.    DIAGNOSTIC DATA (LABS, IMAGING, TESTING) - I reviewed patient records, labs, notes, testing and imaging myself where available.  Lab Results  Component Value Date   WBC 5.1 08/22/2015   HGB 13.2 08/22/2015   HCT 39.7 08/22/2015   MCV 89.0 08/22/2015   PLT 108* 08/22/2015      Component Value Date/Time   NA 133* 08/22/2015 1735   K 3.4* 08/22/2015 1735   CL 93* 08/22/2015 1735   CO2 27 08/22/2015 1735   GLUCOSE 83 08/22/2015 1735   BUN 13 08/22/2015 1735   CREATININE 8.72* 08/22/2015 1735   CALCIUM 9.5 08/22/2015 1735   PROT 7.8 08/22/2015 1735   ALBUMIN 3.3* 08/22/2015 1735   AST 23 08/22/2015 1735   ALT 13* 08/22/2015 1735   ALKPHOS 75 08/22/2015 1735   BILITOT 0.7 08/22/2015 1735   GFRNONAA 5* 08/22/2015 1735   GFRAA 6* 08/22/2015 1735   Lab Results  Component Value Date   CHOL 140 05/08/2011   HDL 44 05/08/2011   LDLCALC  05/08/2011    69        Total Cholesterol/HDL:CHD Risk Coronary Heart Disease Risk Table                     Men   Women  1/2 Average Risk   3.4   3.3  Average Risk       5.0   4.4  2 X Average Risk   9.6   7.1  3  X Average Risk  23.4   11.0        Use the calculated Patient Ratio above and the CHD Risk Table to determine the patient's CHD Risk.        ATP III CLASSIFICATION (LDL):  <100      mg/dL   Optimal  100-129  mg/dL   Near or Above                    Optimal  130-159  mg/dL   Borderline  160-189  mg/dL   High  >190     mg/dL   Very High   TRIG 134 05/08/2011   CHOLHDL 3.2 05/08/2011   No results found for: HGBA1C No results found for: VITAMINB12 Lab Results  Component Value Date   TSH 2.395 05/07/2011       ASSESSMENT AND PLAN  Third nerve palsy of right eye - Plan: US Carotid Bilateral, Angiotensin converting enzyme, ANA w/Reflex, MR MRA HEAD WO CONTRAST, CANCELED: MR MRA HEAD WO CONTRAST  Ischemic stroke - Plan: MR MRA HEAD WO CONTRAST  Other vascular headache - Plan: MR MRA HEAD WO CONTRAST  ESRD on dialysis  Polyneuropathy  Diplopia   In summary, Ronald Brewer is a 74 year old man with a pupil sparing third nerve palsy on the right.  He has preserved intorsion and some downward gaze  (superior oblique) and only partial reduction of her gaze (lateral rectus).  There is complete palsy of the other 4 muscles and the eyelid of the right eye.  The main cause of these is long-standing atherosclerotic risk factors such as hypertension, diabetes and smoking he has all 3, though his diabetes improved with weight loss.  Vasculitis and neurosarcoid are other possible diagnoses.   Aneurysms callosal course third nerve palsies, though the pupil is usually involved.  Typically, ischemic third nerve palsies start to improve around the second month and hopefully, he will get much better over the next 6 - 8 weeks.    Because he may have had involvement of other nerves when evaluated earlier this month, I think we need to get an MR angiogram to make sure that an aneurysm is not playing a role and I will also check angiotensin-converting enzyme to assess for the possibility of sarcoidosis and an ANA. The sedimentation rate RP were normal when he was in the emergency room.   His headache is better so I would not recommend treatment of that unless it worsens again.  He will  return to see me in 2 months or sooner if there are new or worsening neurologic symptoms.    If he has residual diplopia, he may need to get prism glasses.     Richard A. Felecia Shelling, MD, PhD 3/50/0938, 18:29 AM Certified in Neurology, Clinical Neurophysiology, Sleep Medicine, Pain Medicine and Neuroimaging  [facility] [address] [place], [place] [address] [phone]

## 2015-09-19 LAB — ANA W/REFLEX: Anti Nuclear Antibody(ANA): NEGATIVE

## 2015-09-19 LAB — ANGIOTENSIN CONVERTING ENZYME: Angio Convert Enzyme: 72 U/L (ref 14–82)

## 2015-09-22 ENCOUNTER — Telehealth: Payer: Self-pay | Admitting: *Deleted

## 2015-09-22 NOTE — Telephone Encounter (Signed)
-----   Message from Britt Bottom, MD sent at 09/19/2015  5:35 PM EDT ----- Please let him know that the blood work looks good. We will call back after the other studies are performed.

## 2015-09-22 NOTE — Telephone Encounter (Signed)
I have spoken with Mrs. Doty and per RAS, advised that labwork was good; we will call with results of other studies once they are done.  She verbalized understanding of same/fim

## 2015-09-25 ENCOUNTER — Ambulatory Visit (INDEPENDENT_AMBULATORY_CARE_PROVIDER_SITE_OTHER): Payer: Medicare Other

## 2015-09-25 DIAGNOSIS — H4901 Third [oculomotor] nerve palsy, right eye: Secondary | ICD-10-CM | POA: Diagnosis not present

## 2015-09-30 ENCOUNTER — Inpatient Hospital Stay: Admission: RE | Admit: 2015-09-30 | Payer: Medicare Other | Source: Ambulatory Visit

## 2015-10-02 ENCOUNTER — Telehealth: Payer: Self-pay | Admitting: *Deleted

## 2015-10-02 NOTE — Telephone Encounter (Signed)
I have spoken with Ronald Brewer this morning and per RAS, advised that Ronald Brewer' carotid doppler is normal for age/fim

## 2015-10-02 NOTE — Telephone Encounter (Signed)
-----   Message from Britt Bottom, MD sent at 10/01/2015  5:03 PM EDT ----- Please letr him know the carotid dopplers look normal for age

## 2015-11-18 ENCOUNTER — Ambulatory Visit: Payer: Medicare Other | Admitting: Neurology

## 2015-12-11 ENCOUNTER — Ambulatory Visit: Payer: Medicare Other | Admitting: Neurology

## 2016-07-13 ENCOUNTER — Emergency Department (HOSPITAL_COMMUNITY)
Admission: EM | Admit: 2016-07-13 | Discharge: 2016-07-13 | Disposition: A | Discharge status: Home / Self Care | Payer: Medicare Other | Attending: Emergency Medicine | Admitting: Emergency Medicine

## 2016-07-13 ENCOUNTER — Encounter (HOSPITAL_COMMUNITY): Payer: Self-pay | Admitting: *Deleted

## 2016-07-13 DIAGNOSIS — R112 Nausea with vomiting, unspecified: Secondary | ICD-10-CM | POA: Diagnosis present

## 2016-07-13 DIAGNOSIS — K2970 Gastritis, unspecified, without bleeding: Secondary | ICD-10-CM | POA: Diagnosis not present

## 2016-07-13 DIAGNOSIS — Z7982 Long term (current) use of aspirin: Secondary | ICD-10-CM | POA: Diagnosis not present

## 2016-07-13 DIAGNOSIS — I120 Hypertensive chronic kidney disease with stage 5 chronic kidney disease or end stage renal disease: Secondary | ICD-10-CM | POA: Insufficient documentation

## 2016-07-13 DIAGNOSIS — E1122 Type 2 diabetes mellitus with diabetic chronic kidney disease: Secondary | ICD-10-CM | POA: Insufficient documentation

## 2016-07-13 DIAGNOSIS — I2510 Atherosclerotic heart disease of native coronary artery without angina pectoris: Secondary | ICD-10-CM | POA: Insufficient documentation

## 2016-07-13 DIAGNOSIS — N186 End stage renal disease: Secondary | ICD-10-CM | POA: Insufficient documentation

## 2016-07-13 DIAGNOSIS — Z992 Dependence on renal dialysis: Secondary | ICD-10-CM | POA: Diagnosis not present

## 2016-07-13 DIAGNOSIS — Z87891 Personal history of nicotine dependence: Secondary | ICD-10-CM | POA: Diagnosis not present

## 2016-07-13 DIAGNOSIS — Z79899 Other long term (current) drug therapy: Secondary | ICD-10-CM | POA: Diagnosis not present

## 2016-07-13 DIAGNOSIS — I252 Old myocardial infarction: Secondary | ICD-10-CM | POA: Insufficient documentation

## 2016-07-13 LAB — COMPREHENSIVE METABOLIC PANEL
ALK PHOS: 73 U/L (ref 38–126)
ALT: 22 U/L (ref 17–63)
ANION GAP: 22 — AB (ref 5–15)
AST: 28 U/L (ref 15–41)
Albumin: 3.1 g/dL — ABNORMAL LOW (ref 3.5–5.0)
BUN: 45 mg/dL — ABNORMAL HIGH (ref 6–20)
CALCIUM: 8.9 mg/dL (ref 8.9–10.3)
CO2: 25 mmol/L (ref 22–32)
CREATININE: 12.41 mg/dL — AB (ref 0.61–1.24)
Chloride: 91 mmol/L — ABNORMAL LOW (ref 101–111)
GFR, EST AFRICAN AMERICAN: 4 mL/min — AB (ref >60)
GFR, EST NON AFRICAN AMERICAN: 3 mL/min — AB (ref >60)
Glucose, Bld: 52 mg/dL — ABNORMAL LOW (ref 65–99)
Potassium: 3.8 mmol/L (ref 3.5–5.1)
SODIUM: 138 mmol/L (ref 135–145)
Total Bilirubin: 1.5 mg/dL — ABNORMAL HIGH (ref 0.3–1.2)
Total Protein: 7.9 g/dL (ref 6.5–8.1)

## 2016-07-13 LAB — LIPASE, BLOOD: LIPASE: 15 U/L (ref 11–51)

## 2016-07-13 LAB — CBC
HCT: 41.4 % (ref 39.0–52.0)
HEMOGLOBIN: 13.8 g/dL (ref 13.0–17.0)
MCH: 31 pg (ref 26.0–34.0)
MCHC: 33.3 g/dL (ref 30.0–36.0)
MCV: 93 fL (ref 78.0–100.0)
PLATELETS: 173 10*3/uL (ref 150–400)
RBC: 4.45 MIL/uL (ref 4.22–5.81)
RDW: 20.5 % — ABNORMAL HIGH (ref 11.5–15.5)
WBC: 9.4 10*3/uL (ref 4.0–10.5)

## 2016-07-13 MED ORDER — FAMOTIDINE IN NACL 20-0.9 MG/50ML-% IV SOLN
20.0000 mg | Freq: Two times a day (BID) | INTRAVENOUS | Status: DC
  Administered 2016-07-13: 20 mg via INTRAVENOUS
  Filled 2016-07-13: qty 50

## 2016-07-13 MED ORDER — ONDANSETRON HCL 4 MG/2ML IJ SOLN
4.0000 mg | Freq: Once | INTRAMUSCULAR | Status: AC
  Administered 2016-07-13: 4 mg via INTRAVENOUS
  Filled 2016-07-13: qty 2

## 2016-07-13 MED ORDER — SODIUM CHLORIDE 0.9 % IV BOLUS (SEPSIS)
500.0000 mL | Freq: Once | INTRAVENOUS | Status: AC
  Administered 2016-07-13: 500 mL via INTRAVENOUS

## 2016-07-13 NOTE — ED Provider Notes (Signed)
CSN: [id number]     Arrival date & time 07/13/16  [id number] History   First MD Initiated Contact with Patient 07/13/16 0825     Chief Complaint  Patient presents with  . Emesis     (Consider location/radiation/quality/duration/timing/severity/associated sxs/prior Treatment) HPI...Marland KitchenMarland KitchenNausea and vomiting since Monday. Patient has not had dialysis since past Wednesday. Wife reports he is not eating. He is alert. No fever, sweats, chills, chest pain, dyspnea, excessive edema  Past Medical History  Diagnosis Date  . Hypertension   . Renal disorder     esrd  . Coronary artery disease   . Myocardial infarction ([place])   . Anemia   . Diabetes mellitus     25 years ago  . Gastritis   . Renal dialysis status(V45.11)   . Occult blood in stools   . Shortness of breath   . Cough   . Dizziness   . Weakness generalized   . Insomnia    Past Surgical History  Procedure Laterality Date  . Esophagogastroduodenoscopy  07/27/2012    Procedure: ESOPHAGOGASTRODUODENOSCOPY (EGD);  Surgeon: Winfield Cunas., MD;  Location: [facility];  Service: Endoscopy;  Laterality: N/A;  . Colonoscopy  07/27/2012    Procedure: COLONOSCOPY;  Surgeon: Winfield Cunas., MD;  Location: [facility];  Service: Endoscopy;  Laterality: N/A;   No family history on file. Social History  Substance Use Topics  . Smoking status: Former Research scientist (life sciences)  . Smokeless tobacco: None  . Alcohol Use: No    Review of Systems  All other systems reviewed and are negative.     Allergies  Tape  Home Medications   Prior to Admission medications   Medication Sig Start Date End Date Taking? Authorizing Provider  aspirin EC 81 MG tablet Take 81 mg by mouth daily.   Yes Historical Provider, MD  calcium acetate (PHOSLO) 667 MG capsule Take 2,668 mg by mouth 3 (three) times daily with meals.    Yes Historical Provider, MD  amLODipine (NORVASC) 5 MG tablet Take 10 mg by mouth at bedtime. Reported on 07/13/2016 08/02/15   Historical Provider,  MD  B Complex-C-Folic Acid (NEPHRO-VITE PO) Take by mouth. Reported on 07/13/2016    Historical Provider, MD  dextromethorphan-guaiFENesin [facility] DM) 30-600 MG per 12 hr tablet Take 1 tablet by mouth 2 (two) times daily as needed for cough. Reported on 07/13/2016    Historical Provider, MD  losartan (COZAAR) 100 MG tablet Take 1 tablet (100 mg total) by mouth daily. Patient not taking: Reported on 07/13/2016 12/25/14   Margaretann Loveless, MD  megestrol (MEGACE) 400 MG/10ML suspension Take 400 mg by mouth daily. Reported on 07/13/2016    Historical Provider, MD  Multiple Vitamins-Minerals (DIALYVITE 800/ULTRA D PO) Take 1 tablet by mouth daily. Reported on 07/13/2016    Historical Provider, MD  omeprazole (PRILOSEC) 20 MG capsule Take 20 mg by mouth daily. 08/21/15   Historical Provider, MD  sertraline (ZOLOFT) 50 MG tablet Take 50 mg by mouth daily.  08/31/15   Historical Provider, MD  zolpidem (AMBIEN) 5 MG tablet Take 5 mg by mouth at bedtime as needed for sleep. Reported on 07/13/2016    Historical Provider, MD   BP 147/84 mmHg  Pulse 95  Temp(Src) 97.5 F (36.4 C) (Oral)  Resp 13  SpO2 99% Physical Exam  Constitutional: He is oriented to person, place, and time.  Nontoxic-appearing.  HENT:  Head: Normocephalic and atraumatic.  Eyes: Conjunctivae are normal.  Neck: Neck supple.  Cardiovascular: Normal  rate and regular rhythm.   Pulmonary/Chest: Effort normal and breath sounds normal.  Abdominal: Soft. Bowel sounds are normal.  Musculoskeletal: Normal range of motion.  Neurological: He is alert and oriented to person, place, and time.  Skin: Skin is warm and dry.  Psychiatric: He has a normal mood and affect. His behavior is normal.  Nursing note and vitals reviewed.   ED Course  Procedures (including critical care time) Labs Review Labs Reviewed  COMPREHENSIVE METABOLIC PANEL - Abnormal; Notable for the following:    Chloride 91 (*)    Glucose, Bld 52 (*)    BUN 45 (*)    Creatinine,  Ser 12.41 (*)    Albumin 3.1 (*)    Total Bilirubin 1.5 (*)    GFR calc non Af Amer 3 (*)    GFR calc Af Amer 4 (*)    Anion gap 22 (*)    All other components within normal limits  CBC - Abnormal; Notable for the following:    RDW 20.5 (*)    All other components within normal limits  LIPASE, BLOOD    Imaging Review No results found. I have personally reviewed and evaluated these images and lab results as part of my medical decision-making.   EKG Interpretation   Date/Time:  Tuesday July 13 2016 08:21:53 EDT Ventricular Rate:  101 PR Interval:    QRS Duration: 85 QT Interval:  417 QTC Calculation: 541 R Axis:   -66 Text Interpretation:  Sinus tachycardia LAE, consider biatrial enlargement  Inferior infarct, old Abnormal lateral Q waves Anterior infarct, old  Prolonged QT interval Confirmed by Lacinda Axon  MD, Armoni Kludt ([id number]) on 07/13/2016  8:26:16 AM Also confirmed by Lacinda Axon  MD, Ocean Kearley ([id number])  on 07/13/2016 8:39:36  AM Also confirmed by Lacinda Axon  MD, Neill Jurewicz ([id number]), editor [place] CT, Leda Gauze  [phone])  on 07/13/2016 12:12:55 PM      MDM   Final diagnoses:  ESRD on dialysis ([place])  Gastritis    Vital signs are stable. Patient feels better after IV fluid bolus, IV Pepcid, IV Zofran. Glucose was noted to be low. Calories were offered in the ED. I recommended admission to the hospital, but patient and wife disagreed. He will get dialysis tomorrow. I recommended that patient eat immediately.    Nat Christen, MD 07/13/16 1251

## 2016-07-13 NOTE — ED Notes (Signed)
Pts wife has refused to let her husband eat or drink anything while he is here.  She said she will take him to [facility] when he leaves here.

## 2016-07-13 NOTE — ED Notes (Signed)
Pt and his wife was instructed to eat IMMEDIATELY UPON LEAVING THE ER, b/c of low blood sugar.

## 2016-07-13 NOTE — ED Notes (Signed)
Pt states he doesn't make urine.  Notified Dr Lacinda Axon.

## 2016-07-13 NOTE — ED Notes (Signed)
Pt in from home via [facility], per report pt c/o n/v onset x 3 days, with reported hematemesis x 3 onset today, pt takes HD M,W, F, pt missed HD yesterday d/t feeling bad, denies SOB,  A&O x4

## 2016-07-13 NOTE — Discharge Instructions (Signed)
Your glucose is low. You must eat immediately upon leaving hospital. Make sure to go to dialysis tomorrow

## 2016-09-28 ENCOUNTER — Ambulatory Visit: Payer: Medicare Other | Admitting: Family Medicine

## 2016-09-29 ENCOUNTER — Encounter: Payer: Self-pay | Admitting: Sports Medicine

## 2016-11-08 ENCOUNTER — Encounter (HOSPITAL_COMMUNITY): Payer: Self-pay | Admitting: Emergency Medicine

## 2016-11-08 ENCOUNTER — Emergency Department (HOSPITAL_COMMUNITY): Payer: Medicare Other

## 2016-11-08 ENCOUNTER — Observation Stay (HOSPITAL_COMMUNITY)
Admission: EM | Admit: 2016-11-08 | Discharge: 2016-11-09 | Disposition: A | Discharge status: Home / Self Care | Payer: Medicare Other | Attending: Internal Medicine | Admitting: Internal Medicine

## 2016-11-08 DIAGNOSIS — R0602 Shortness of breath: Secondary | ICD-10-CM | POA: Insufficient documentation

## 2016-11-08 DIAGNOSIS — E876 Hypokalemia: Secondary | ICD-10-CM | POA: Diagnosis not present

## 2016-11-08 DIAGNOSIS — E1142 Type 2 diabetes mellitus with diabetic polyneuropathy: Secondary | ICD-10-CM | POA: Insufficient documentation

## 2016-11-08 DIAGNOSIS — M542 Cervicalgia: Secondary | ICD-10-CM | POA: Diagnosis present

## 2016-11-08 DIAGNOSIS — Z7982 Long term (current) use of aspirin: Secondary | ICD-10-CM | POA: Insufficient documentation

## 2016-11-08 DIAGNOSIS — Z87891 Personal history of nicotine dependence: Secondary | ICD-10-CM | POA: Insufficient documentation

## 2016-11-08 DIAGNOSIS — R531 Weakness: Secondary | ICD-10-CM | POA: Diagnosis present

## 2016-11-08 DIAGNOSIS — I252 Old myocardial infarction: Secondary | ICD-10-CM | POA: Insufficient documentation

## 2016-11-08 DIAGNOSIS — M25512 Pain in left shoulder: Secondary | ICD-10-CM | POA: Insufficient documentation

## 2016-11-08 DIAGNOSIS — I2510 Atherosclerotic heart disease of native coronary artery without angina pectoris: Secondary | ICD-10-CM | POA: Insufficient documentation

## 2016-11-08 DIAGNOSIS — Z992 Dependence on renal dialysis: Secondary | ICD-10-CM | POA: Insufficient documentation

## 2016-11-08 DIAGNOSIS — N186 End stage renal disease: Secondary | ICD-10-CM | POA: Diagnosis not present

## 2016-11-08 DIAGNOSIS — R079 Chest pain, unspecified: Secondary | ICD-10-CM | POA: Diagnosis not present

## 2016-11-08 DIAGNOSIS — R0789 Other chest pain: Secondary | ICD-10-CM | POA: Insufficient documentation

## 2016-11-08 DIAGNOSIS — D649 Anemia, unspecified: Principal | ICD-10-CM | POA: Diagnosis present

## 2016-11-08 DIAGNOSIS — Z8673 Personal history of transient ischemic attack (TIA), and cerebral infarction without residual deficits: Secondary | ICD-10-CM | POA: Insufficient documentation

## 2016-11-08 DIAGNOSIS — D631 Anemia in chronic kidney disease: Secondary | ICD-10-CM

## 2016-11-08 DIAGNOSIS — R5383 Other fatigue: Secondary | ICD-10-CM | POA: Insufficient documentation

## 2016-11-08 DIAGNOSIS — I120 Hypertensive chronic kidney disease with stage 5 chronic kidney disease or end stage renal disease: Secondary | ICD-10-CM | POA: Insufficient documentation

## 2016-11-08 DIAGNOSIS — Z79899 Other long term (current) drug therapy: Secondary | ICD-10-CM | POA: Diagnosis not present

## 2016-11-08 DIAGNOSIS — M25511 Pain in right shoulder: Secondary | ICD-10-CM | POA: Diagnosis not present

## 2016-11-08 LAB — CBC
HCT: 29.3 % — ABNORMAL LOW (ref 39.0–52.0)
HCT: 29.5 % — ABNORMAL LOW (ref 39.0–52.0)
Hemoglobin: 9.6 g/dL — ABNORMAL LOW (ref 13.0–17.0)
Hemoglobin: 9.6 g/dL — ABNORMAL LOW (ref 13.0–17.0)
MCH: 28.3 pg (ref 26.0–34.0)
MCH: 28.3 pg (ref 26.0–34.0)
MCHC: 32.8 g/dL (ref 30.0–36.0)
MCHC: 32.5 g/dL (ref 30.0–36.0)
MCV: 86.4 fL (ref 78.0–100.0)
MCV: 87 fL (ref 78.0–100.0)
PLATELETS: 119 10*3/uL — AB (ref 150–400)
PLATELETS: 118 10*3/uL — AB (ref 150–400)
RBC: 3.39 MIL/uL — ABNORMAL LOW (ref 4.22–5.81)
RBC: 3.39 MIL/uL — AB (ref 4.22–5.81)
RDW: 15.9 % — AB (ref 11.5–15.5)
RDW: 15.7 % — AB (ref 11.5–15.5)
WBC: 7.2 10*3/uL (ref 4.0–10.5)
WBC: 9.3 10*3/uL (ref 4.0–10.5)

## 2016-11-08 LAB — BASIC METABOLIC PANEL
Anion gap: 11 (ref 5–15)
BUN: 12 mg/dL (ref 6–20)
CHLORIDE: 95 mmol/L — AB (ref 101–111)
CO2: 31 mmol/L (ref 22–32)
CREATININE: 5.89 mg/dL — AB (ref 0.61–1.24)
Calcium: 8 mg/dL — ABNORMAL LOW (ref 8.9–10.3)
GFR calc Af Amer: 10 mL/min — ABNORMAL LOW (ref >60)
GFR calc non Af Amer: 8 mL/min — ABNORMAL LOW (ref >60)
Glucose, Bld: 82 mg/dL (ref 65–99)
Potassium: 3 mmol/L — ABNORMAL LOW (ref 3.5–5.1)
SODIUM: 137 mmol/L (ref 135–145)

## 2016-11-08 LAB — LIPID PANEL
CHOLESTEROL: 101 mg/dL (ref 0–200)
HDL: 30 mg/dL — AB (ref >40)
LDL Cholesterol: 55 mg/dL (ref 0–99)
TRIGLYCERIDES: 81 mg/dL (ref <150)
Total CHOL/HDL Ratio: 3.4 RATIO
VLDL: 16 mg/dL (ref 0–40)

## 2016-11-08 LAB — POC OCCULT BLOOD, ED: Fecal Occult Bld: NEGATIVE

## 2016-11-08 LAB — I-STAT TROPONIN, ED: Troponin i, poc: 0.03 ng/mL (ref 0.00–0.08)

## 2016-11-08 LAB — MAGNESIUM: Magnesium: 1.8 mg/dL (ref 1.7–2.4)

## 2016-11-08 LAB — BRAIN NATRIURETIC PEPTIDE: B NATRIURETIC PEPTIDE 5: 1746.7 pg/mL — AB (ref 0.0–100.0)

## 2016-11-08 LAB — TSH: TSH: 3.547 u[IU]/mL (ref 0.350–4.500)

## 2016-11-08 MED ORDER — HYDROXYZINE HCL 25 MG PO TABS
25.0000 mg | ORAL_TABLET | Freq: Three times a day (TID) | ORAL | Status: DC | PRN
  Filled 2016-11-08: qty 1

## 2016-11-08 MED ORDER — SORBITOL 70 % SOLN: 30.0000 mL | Status: DC | PRN

## 2016-11-08 MED ORDER — GI COCKTAIL ~~LOC~~: 30.0000 mL | Freq: Four times a day (QID) | ORAL | Status: DC | PRN

## 2016-11-08 MED ORDER — ZOLPIDEM TARTRATE 5 MG PO TABS
5.0000 mg | ORAL_TABLET | Freq: Every evening | ORAL | Status: DC | PRN
  Administered 2016-11-09: 5 mg via ORAL
  Filled 2016-11-08: qty 1

## 2016-11-08 MED ORDER — ACETAMINOPHEN 650 MG RE SUPP: 650.0000 mg | Freq: Four times a day (QID) | RECTAL | Status: DC | PRN

## 2016-11-08 MED ORDER — CALCIUM ACETATE (PHOS BINDER) 667 MG PO CAPS: 2668.0000 mg | ORAL_CAPSULE | Freq: Three times a day (TID) | ORAL | Status: DC

## 2016-11-08 MED ORDER — MORPHINE SULFATE (PF) 2 MG/ML IV SOLN
2.0000 mg | INTRAVENOUS | Status: DC | PRN
  Administered 2016-11-09 (×2): 2 mg via INTRAVENOUS
  Filled 2016-11-08 (×2): qty 1

## 2016-11-08 MED ORDER — POTASSIUM CHLORIDE CRYS ER 20 MEQ PO TBCR
40.0000 meq | EXTENDED_RELEASE_TABLET | Freq: Once | ORAL | Status: AC
  Administered 2016-11-09: 40 meq via ORAL
  Filled 2016-11-08: qty 2

## 2016-11-08 MED ORDER — CAMPHOR-MENTHOL 0.5-0.5 % EX LOTN: 1.0000 "application " | TOPICAL_LOTION | Freq: Three times a day (TID) | CUTANEOUS | Status: DC | PRN

## 2016-11-08 MED ORDER — NEPRO/CARBSTEADY PO LIQD: 237.0000 mL | Freq: Three times a day (TID) | ORAL | Status: DC | PRN

## 2016-11-08 MED ORDER — ONDANSETRON HCL 4 MG PO TABS: 4.0000 mg | ORAL_TABLET | Freq: Four times a day (QID) | ORAL | Status: DC | PRN

## 2016-11-08 MED ORDER — ACETAMINOPHEN 325 MG PO TABS
650.0000 mg | ORAL_TABLET | Freq: Four times a day (QID) | ORAL | Status: DC | PRN
Start: 2016-11-08 — End: 2016-11-09

## 2016-11-08 MED ORDER — DOCUSATE SODIUM 283 MG RE ENEM
1.0000 | ENEMA | RECTAL | Status: DC | PRN
  Filled 2016-11-08: qty 1

## 2016-11-08 MED ORDER — ONDANSETRON HCL 4 MG/2ML IJ SOLN: 4.0000 mg | Freq: Four times a day (QID) | INTRAMUSCULAR | Status: DC | PRN

## 2016-11-08 MED ORDER — ASPIRIN EC 81 MG PO TBEC
81.0000 mg | DELAYED_RELEASE_TABLET | Freq: Every day | ORAL | Status: DC
  Administered 2016-11-09: 81 mg via ORAL
  Filled 2016-11-08: qty 1

## 2016-11-08 MED ORDER — CALCIUM CARBONATE ANTACID 1250 MG/5ML PO SUSP
500.0000 mg | Freq: Four times a day (QID) | ORAL | Status: DC | PRN
  Filled 2016-11-08: qty 5

## 2016-11-08 MED ORDER — FENTANYL CITRATE (PF) 100 MCG/2ML IJ SOLN
25.0000 ug | Freq: Once | INTRAMUSCULAR | Status: AC
  Administered 2016-11-08: 25 ug via INTRAVENOUS
  Filled 2016-11-08: qty 2

## 2016-11-08 NOTE — H&P (Signed)
History and Physical    SHARBEL CIOFFI [id number] DOB: 1941-12-17 DOA: 11/08/2016  PCP: Placido Sou, MD Consultants:  None Patient coming from: home - lives with wife; NOK: wife, [phone]  Chief Complaint: neck/chest pain, weakness  HPI: Ronald Brewer is a 75 y.o. male with medical history significant of ESRD on MWF HD, HTN, DM (diet controlled), and CAD presenting with excruciating pain in his left neck for about 3 weeks.  His wife, who was screaming at and/or chastising the patient throughout much of the interview, reports that this is because he sleeps on 3 big pillows.  Pain is beginning to improve after IV pain meds, but now 8/10.  Nothing makes the pain worse.  Also with substernal chest pain for a couple of weeks.  Nothing makes better/worse.  Chest pain is present intermittently.  Non-exertional.  No abdominal pain.  Wife reports that he won't eat.  Had dark stools about 3 weeks ago, lasted for a day or so.  Last BM was yesterday and kind of light brownish.  His wife reports that she is a CNA and that he is NOT having any bleeding.  No SOB.  Also with hip pain.   ED Course: Per Dr. Martyn Ehrich: Patient is a 75 year old male with past medical history of coronary artery disease, hypertension, diabetes and end-stage renal disease currently on Monday Wednesday Friday dialysis who presents to emergency department today with atypical chest pain and bilateral shoulder pain has been ongoing for the past 3 weeks. Patient also notes fatigue and worsening shortness of breath.  Patient denies any exertional component and with his chest pain, no nausea or vomiting or diaphoresis. He does through review of systems endorses black stool has been ongoing for the past 2 weeks.  EKG shows no significant changes from his previous with intermittent PVCs.  Patient has high heart score and found to have new 3-point drop in hemoglobin with black stools.  Possible cardiac etiology but more likely  this could be symptomatically anemia for his fatigue.  Patient will need admission for further troponin testing and possible GI consult.  Review of Systems: As per HPI; otherwise 10 point review of systems reviewed and negative.   Ambulatory Status:  Ambulates with a walker  Past Medical History:  Diagnosis Date  . Anemia   . Coronary artery disease   . Cough   . Diabetes mellitus    25 years ago  . Dizziness   . Gastritis   . Hypertension   . Insomnia   . Myocardial infarction   . Occult blood in stools   . Renal dialysis status(V45.11)   . Renal disorder    esrd  . Shortness of breath   . Weakness generalized     Past Surgical History:  Procedure Laterality Date  . COLONOSCOPY  07/27/2012   Procedure: COLONOSCOPY;  Surgeon: Winfield Cunas., MD;  Location: [facility];  Service: Endoscopy;  Laterality: N/A;  . ESOPHAGOGASTRODUODENOSCOPY  07/27/2012   Procedure: ESOPHAGOGASTRODUODENOSCOPY (EGD);  Surgeon: Winfield Cunas., MD;  Location: [facility];  Service: Endoscopy;  Laterality: N/A;    Social History   Social History  . Marital status: Married    Spouse name: N/A  . Number of children: N/A  . Years of education: N/A   Occupational History  . retired    Social History Main Topics  . Smoking status: Former Smoker    Quit date: 1967  . Smokeless tobacco: Former Systems developer  . Alcohol  use No  . Drug use: No  . Sexual activity: Not on file   Other Topics Concern  . Not on file   Social History Narrative  . No narrative on file    Allergies  Allergen Reactions  . Tape Itching and Rash    Please use "paper" tape only.    History reviewed. No pertinent family history.  Prior to Admission medications   Medication Sig Start Date End Date Taking? Authorizing Provider  amLODipine (NORVASC) 5 MG tablet Take 10 mg by mouth at bedtime. Reported on 07/13/2016 08/02/15   Historical Provider, MD  aspirin EC 81 MG tablet Take 81 mg by mouth daily.    Historical  Provider, MD  B Complex-C-Folic Acid (NEPHRO-VITE PO) Take by mouth. Reported on 07/13/2016    Historical Provider, MD  calcium acetate (PHOSLO) 667 MG capsule Take 2,668 mg by mouth 3 (three) times daily with meals.     Historical Provider, MD  dextromethorphan-guaiFENesin (MUCINEX DM) 30-600 MG per 12 hr tablet Take 1 tablet by mouth 2 (two) times daily as needed for cough. Reported on 07/13/2016    Historical Provider, MD  losartan (COZAAR) 100 MG tablet Take 1 tablet (100 mg total) by mouth daily. Patient not taking: Reported on 07/13/2016 12/25/14   Margaretann Loveless, MD  megestrol (MEGACE) 400 MG/10ML suspension Take 400 mg by mouth daily. Reported on 07/13/2016    Historical Provider, MD  Multiple Vitamins-Minerals (DIALYVITE 800/ULTRA D PO) Take 1 tablet by mouth daily. Reported on 07/13/2016    Historical Provider, MD  omeprazole (PRILOSEC) 20 MG capsule Take 20 mg by mouth daily. 08/21/15   Historical Provider, MD  sertraline (ZOLOFT) 50 MG tablet Take 50 mg by mouth daily.  08/31/15   Historical Provider, MD  zolpidem (AMBIEN) 5 MG tablet Take 5 mg by mouth at bedtime as needed for sleep. Reported on 07/13/2016    Historical Provider, MD    Physical Exam: Vitals:   11/08/16 1700 11/08/16 1715 11/08/16 1730 11/08/16 1938  BP: 157/70 156/62 149/73 165/72  Pulse: 76 76 (!) 59 78  Resp: 15 21 16 15   Temp:      TempSrc:      SpO2: 99% 99% 99% 97%  Height:         General: Appears calm and comfortable and is NAD Eyes:  PERRL, EOMI, normal lids, iris ENT:  grossly normal hearing, lips & tongue, mmm Neck:  no LAD, masses or thyromegaly Cardiovascular:  RRR, no m/r/g. No LE edema.  Respiratory:  CTA bilaterally, no w/r/r. Normal respiratory effort. Abdomen:  soft, ntnd, NABS Skin:  no rash or induration seen on limited exam Musculoskeletal:  grossly normal tone BUE/BLE, good ROM, no bony abnormality Psychiatric:  grossly normal mood and affect, speech fluent and appropriate, AOx3 Neurologic:   CN 2-12 grossly intact, moves all extremities in coordinated fashion, sensation intact  Labs on Admission: I have personally reviewed following labs and imaging studies  CBC:  Recent Labs Lab 11/08/16 1620  WBC 7.2  HGB 9.6*  HCT 29.3*  MCV 86.4  PLT 123456*   Basic Metabolic Panel:  Recent Labs Lab 11/08/16 1620  NA 137  K 3.0*  CL 95*  CO2 31  GLUCOSE 82  BUN 12  CREATININE 5.89*  CALCIUM 8.0*   GFR: CrCl cannot be calculated (Unknown ideal weight.). Liver Function Tests: No results for input(s): AST, ALT, ALKPHOS, BILITOT, PROT, ALBUMIN in the last 168 hours. No results for input(s): LIPASE, AMYLASE  in the last 168 hours. No results for input(s): AMMONIA in the last 168 hours. Coagulation Profile: No results for input(s): INR, PROTIME in the last 168 hours. Cardiac Enzymes: No results for input(s): CKTOTAL, CKMB, CKMBINDEX, TROPONINI in the last 168 hours. BNP (last 3 results) No results for input(s): PROBNP in the last 8760 hours. HbA1C: No results for input(s): HGBA1C in the last 72 hours. CBG: No results for input(s): GLUCAP in the last 168 hours. Lipid Profile: No results for input(s): CHOL, HDL, LDLCALC, TRIG, CHOLHDL, LDLDIRECT in the last 72 hours. Thyroid Function Tests: No results for input(s): TSH, T4TOTAL, FREET4, T3FREE, THYROIDAB in the last 72 hours. Anemia Panel: No results for input(s): VITAMINB12, FOLATE, FERRITIN, TIBC, IRON, RETICCTPCT in the last 72 hours. Urine analysis:    Component Value Date/Time   COLORURINE AMBER BIOCHEMICALS MAY BE AFFECTED BY COLOR (A) 05/08/2011 1729   APPEARANCEUR CLOUDY (A) 05/08/2011 1729   LABSPEC 1.016 05/08/2011 1729   PHURINE 7.0 05/08/2011 1729   GLUCOSEU NEGATIVE 05/08/2011 1729   HGBUR LARGE (A) 05/08/2011 1729   BILIRUBINUR NEGATIVE 05/08/2011 1729   KETONESUR 15 (A) 05/08/2011 1729   PROTEINUR >300 (A) 05/08/2011 1729   UROBILINOGEN 0.2 05/08/2011 1729   NITRITE NEGATIVE 05/08/2011 1729    LEUKOCYTESUR LARGE (A) 05/08/2011 1729    Creatinine Clearance: CrCl cannot be calculated (Unknown ideal weight.).  Sepsis Labs: @LABRCNTIP (procalcitonin:4,lacticidven:4) )No results found for this or any previous visit (from the past 240 hour(s)).   Radiological Exams on Admission: Dg Chest Portable 1 View  Result Date: 11/08/2016 CLINICAL DATA:  Severe bilateral upper shoulder pain radiating to both sides of the neck. Patient did not complete dialysis today because of the pain. The patient reports feeling week for 4 days with occasional dizziness. EXAM: PORTABLE CHEST 1 VIEW COMPARISON:  PA and lateral chest x-ray of January 07, 2013 FINDINGS: The lungs are mildly hyperinflated. The interstitial markings are increased bilaterally. The pulmonary vascularity is engorged. The cardiac silhouette is enlarged. There is calcification in the wall of the aortic arch. The bony thorax exhibits no acute abnormality. IMPRESSION: Mild pulmonary interstitial edema consistent with CHF. There is no pneumothorax or significant pleural effusion. Thoracic aortic atherosclerosis. Electronically Signed   By: David  Martinique M.D.   On: 11/08/2016 17:20    EKG: Independently reviewed.  NSR with rate 69; trigeminy with no evidence of acute ischemia  Assessment/Plan Principal Problem:   Anemia Active Problems:   ESRD on dialysis [facility])   Hypokalemia   Chest pain   Neck pain   Anemia -Patient with vague symptoms including weakness, SOB (reported to ER physician but not to me), and black stools (reported to me only 1 day but to ER physician for weeks) with Hgb 9.6, prior 13.8 (7/17), 13.2 (8/16), 12.2 (12/15), 9.1 (1/14). -Patient does have ESRD on HD and so anemia of chronic disease would not be surprising (MCV is normal), but this does appear to be a fairly acute change. -Will place in observation to monitor Hgb. -ER physician was unable to obtain stool for guaiac; will attempt with RN on floor to assess  whether patient does have evidence of bleeding. -No other intervention for this issue at this time, but may need GI input if concern exists for GI bleeding.  ESRD -MWF HD -Was dialyzed today but reportedly unable to complete entire session due to symptoms -If he remains inpatient on Wednesday, will need to be dialyzed again. -Nephrology prn order set utilized.  Chest pain -Difficult  history to obtain but patient seems to describe intermittent substernal chest pain for several weeks, non-exertional, comes and goes -1/3 typical symptoms suggestive of atypical vs. Non-cardiac chest pain.  -CXR shows mild volume overload, possibly associated with shortened HD session today.  Denies symptoms at time of my evaluation, will not treat for now. -Last Echo appears to have been in 7/11 and showed EF 50-55% with grade 1 diastolic dysfunction and mild mitral regurgitation. -Initial cardiac enzymes negative.   -EKG not indicative of acute ischemia.   -TIMI risk score is 3; which predicts a 14 days risk of death, recurrent MI, or urgent revascularization of 13.2%.  -Will plan to place in observation status on telemetry to rule out ACS by overnight observation.  -cycle cardiac enzymes q6h x 3 and repeat EKG in AM -Continue ASA daily -morphine given -Risk factor stratification with FLP and will also check TSH; normal glucose so no A1c at this time -Based on inconsistent history, will defer to day team based on tomorrow's history and findings about whether stress/echo/cardiology consult are indicated  Neck pain -Nonspecific findings on PE -Pain control for CP with tylenol/morphine, suspect that this is MSK pain which will likely improve with these interventions too  Hypokalemia -Replete.  Will follow.  Will check Mag level.   DVT prophylaxis:  Lovenox Code Status:  Full - confirmed with patient/family Family Communication: Wife present and very vocal about her disapproval of this ER  visit/hospitalization throughout encounter  Disposition Plan:  Home once clinically improved Consults called: None  Admission status: It is my clinical opinion that referral for OBSERVATION is reasonable and necessary in this patient based on the above information provided. The aforementioned taken together are felt to place the patient at high risk for further clinical deterioration. However it is anticipated that the patient may be medically stable for discharge from the hospital within 24 to 48 hours.    Karmen Bongo MD [facility]  If 7PM-7AM, please contact night-coverage [email or web address] Password [facility]  11/08/2016, 8:34 PM

## 2016-11-08 NOTE — Progress Notes (Signed)
Deaccessed graft;pt is a bleeder;hold pressure for 30 minutes ;drsg applied.Ed Rn made aware.

## 2016-11-08 NOTE — ED Notes (Signed)
ED Provider at bedside. 

## 2016-11-08 NOTE — ED Provider Notes (Signed)
[facility] DEPT Provider Note   CSN: [id number] Arrival date & time: 11/08/16  1528  History   Chief Complaint Chief Complaint  Patient presents with  . Weakness  . Chest Pain    HPI MEDHANSH SPANGLER is a 75 y.o. male.   Chest Pain   Associated symptoms include back pain and cough. Pertinent negatives include no diaphoresis, no dizziness, no fever, no nausea, no numbness, no shortness of breath, no vomiting and no weakness.  Shoulder Pain   This is a new problem. The current episode started more than 1 week ago (3 weeks). The problem occurs constantly. The problem has not changed since onset.The pain is present in the left shoulder and back. The quality of the pain is described as sharp. The pain is at a severity of 9/10. The pain is moderate. Pertinent negatives include no numbness, no stiffness and no tingling. He has tried nothing for the symptoms. There has been a history of trauma.    Past Medical History:  Diagnosis Date  . Anemia   . Coronary artery disease   . Cough   . Diabetes mellitus    25 years ago  . Dizziness   . Gastritis   . Hypertension   . Insomnia   . Myocardial infarction   . Occult blood in stools   . Renal dialysis status(V45.11)   . Renal disorder    esrd  . Shortness of breath   . Weakness generalized    Patient Active Problem List   Diagnosis Date Noted  . ESRD on dialysis ([place]) 09/18/2015  . 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 Ischemic stroke ([place]) 09/18/2015  . Headache 09/18/2015  . Polyneuropathy ([place]) 09/18/2015  . Diplopia 09/18/2015    Past Surgical History:  Procedure Laterality Date  . COLONOSCOPY  07/27/2012   Procedure: COLONOSCOPY;  Surgeon: Winfield Cunas., MD;  Location: [facility];  Service: Endoscopy;  Laterality: N/A;  . ESOPHAGOGASTRODUODENOSCOPY  07/27/2012   Procedure: ESOPHAGOGASTRODUODENOSCOPY (EGD);  Surgeon: Winfield Cunas., MD;  Location: [facility];  Service: Endoscopy;  Laterality: N/A;        Home Medications    Prior to Admission medications   Medication Sig Start Date End Date Taking? Authorizing Provider  amLODipine (NORVASC) 5 MG tablet Take 10 mg by mouth at bedtime. Reported on 07/13/2016 08/02/15   Historical Provider, MD  aspirin EC 81 MG tablet Take 81 mg by mouth daily.    Historical Provider, MD  B Complex-C-Folic Acid (NEPHRO-VITE PO) Take by mouth. Reported on 07/13/2016    Historical Provider, MD  calcium acetate (PHOSLO) 667 MG capsule Take 2,668 mg by mouth 3 (three) times daily with meals.     Historical Provider, MD  dextromethorphan-guaiFENesin (MUCINEX DM) 30-600 MG per 12 hr tablet Take 1 tablet by mouth 2 (two) times daily as needed for cough. Reported on 07/13/2016    Historical Provider, MD  losartan (COZAAR) 100 MG tablet Take 1 tablet (100 mg total) by mouth daily. Patient not taking: Reported on 07/13/2016 12/25/14   Margaretann Loveless, MD  megestrol (MEGACE) 400 MG/10ML suspension Take 400 mg by mouth daily. Reported on 07/13/2016    Historical Provider, MD  Multiple Vitamins-Minerals (DIALYVITE 800/ULTRA D PO) Take 1 tablet by mouth daily. Reported on 07/13/2016    Historical Provider, MD  omeprazole (PRILOSEC) 20 MG capsule Take 20 mg by mouth daily. 08/21/15   Historical Provider, MD  sertraline (ZOLOFT) 50 MG tablet Take 50  mg by mouth daily.  08/31/15   Historical Provider, MD  zolpidem (AMBIEN) 5 MG tablet Take 5 mg by mouth at bedtime as needed for sleep. Reported on 07/13/2016    Historical Provider, MD    Family History History reviewed. No pertinent family history.  Social History Social History  Substance Use Topics  . Smoking status: Former Research scientist (life sciences)  . Smokeless tobacco: Former Systems developer  . Alcohol use No     Allergies   Tape   Review of Systems Review of Systems  Constitutional: Positive for fatigue. Negative for diaphoresis and fever.  Respiratory: Positive for cough. Negative for chest tightness and shortness of breath.   Cardiovascular:  Positive for chest pain. Negative for leg swelling.  Gastrointestinal: Negative for diarrhea, nausea and vomiting.       Reports black stool  Musculoskeletal: Positive for back pain. Negative for stiffness.  Neurological: Negative for dizziness, tingling, tremors, weakness and numbness.  All other systems reviewed and are negative.  Physical Exam Updated Vital Signs BP 137/73 (BP Location: Right Arm)   Pulse 78   Temp 98.5 F (36.9 C) (Oral)   Resp 11   Ht 5\' 9"  (1.753 m)   SpO2 100%   Physical Exam  Constitutional: He is oriented to person, place, and time. He appears well-developed and well-nourished. No distress.  HENT:  Head: Normocephalic and atraumatic.  Eyes: Pupils are equal, round, and reactive to light.  Neck: Normal range of motion.  Cardiovascular: Normal rate.   Pulmonary/Chest: Effort normal. No respiratory distress. He has no wheezes.  Abdominal: Soft. He exhibits no distension. There is no tenderness.  Musculoskeletal: He exhibits no edema.  Reproducible bilateral neck and supraclavicular pain with no palpable mass.   Left arm +2 radial pulses  5/5 bilateral intrinsic hand/bicep flexion/tricep extension  No reproducible shoulder pain with ROM  Neurological: He is alert and oriented to person, place, and time. No cranial nerve deficit. He exhibits normal muscle tone. Coordination normal.  Skin: Capillary refill takes less than 2 seconds. He is not diaphoretic.  Left AV fistula of left upper arm  Psychiatric: His behavior is normal. Thought content normal.  Nursing note and vitals reviewed.    ED Treatments / Results  Labs (all labs ordered are listed, but only abnormal results are displayed) [place], ED   EKG  EKG Interpretation None      Radiology No results found.  Procedures Procedures (including critical care time)  Medications Ordered in ED Medications - No data to  display   Initial Impression / Assessment and Plan / ED Course  I have reviewed the triage vital signs and the nursing notes.  Pertinent labs & imaging results that were available during my care of the patient were reviewed by me and considered in my medical decision making (see chart for details).  Clinical Course    Patient is a 75 year old male with past medical history of coronary artery disease, hypertension, diabetes and end-stage renal disease currently on Monday Wednesday Friday dialysis who presents to emergency department today with atypical chest pain and bilateral shoulder pain has been ongoing for the past 3 weeks. Patient also notes fatigue and worsening shortness of breath.  Patient denies any exertional component and with his chest pain, no nausea or vomiting or diaphoresis. He does through review of systems endorses black stool has been ongoing for the past 2 weeks.  EKG shows no significant changes from his  previous with intermittent PVCs.  Patient has high heart score and found to have new 3-point drop in hemoglobin with black stools.  Possible cardiac etiology but more likely this could be symptomatically anemia for his fatigue.  Patient will need admission for further troponin testing and possible GI consult.  Final Clinical Impressions(s) / ED Diagnoses   Final diagnoses:  Anemia, unspecified type  Atypical chest pain  Other fatigue    New Prescriptions New Prescriptions   No medications on file     Roberto Scales, MD 11/08/16 [place], MD 11/10/16 2224

## 2016-11-08 NOTE — ED Triage Notes (Addendum)
Pt arrived via EMS from his dialysis appointment. Pt states the last time he received dialysis was "last Wednesday" and EMS reports pt received 2 hours dialysis today; graft still accessed on assessment. Pt states he "wanted to come" to the ED because he has been "feeling weak" x 4 days and "at times" feels dizzy. Reports "falling last Monday;" reports hitting his head, denies pain at this time. Pt reports shoulder/chest pain 9/10. Pt reports feeling increasingly "cold" from baseline; pt afebrile on assessment. Pt AxOx4; resp e/u; NAD noted at this time.

## 2016-11-09 DIAGNOSIS — N186 End stage renal disease: Secondary | ICD-10-CM | POA: Diagnosis not present

## 2016-11-09 DIAGNOSIS — E876 Hypokalemia: Secondary | ICD-10-CM | POA: Diagnosis not present

## 2016-11-09 DIAGNOSIS — D649 Anemia, unspecified: Secondary | ICD-10-CM | POA: Diagnosis not present

## 2016-11-09 DIAGNOSIS — Z992 Dependence on renal dialysis: Secondary | ICD-10-CM

## 2016-11-09 LAB — CBC
HEMATOCRIT: 29.5 % — AB (ref 39.0–52.0)
Hemoglobin: 9.6 g/dL — ABNORMAL LOW (ref 13.0–17.0)
MCH: 28.3 pg (ref 26.0–34.0)
MCHC: 32.5 g/dL (ref 30.0–36.0)
MCV: 87 fL (ref 78.0–100.0)
PLATELETS: 121 10*3/uL — AB (ref 150–400)
RBC: 3.39 MIL/uL — AB (ref 4.22–5.81)
RDW: 15.6 % — ABNORMAL HIGH (ref 11.5–15.5)
WBC: 9.8 10*3/uL (ref 4.0–10.5)

## 2016-11-09 LAB — TROPONIN I
TROPONIN I: 0.03 ng/mL — AB (ref <0.03)
Troponin I: 0.03 ng/mL (ref <0.03)
Troponin I: 0.03 ng/mL (ref <0.03)

## 2016-11-09 LAB — MRSA PCR SCREENING: MRSA by PCR: NEGATIVE

## 2016-11-09 MED ORDER — HYDROCODONE-ACETAMINOPHEN 5-325 MG PO TABS: 1.0000 | ORAL_TABLET | ORAL | Status: DC | PRN

## 2016-11-09 NOTE — Progress Notes (Signed)
Patients wife is at bedside stating, "my husband is telling lies, he is lazy, he isn't having any pain, he just wants to be petted." Also, she states, "his physician knows he tells lies." MD notified to come and explain patients care to wife. Will continue to monitor.

## 2016-11-09 NOTE — Care Management Obs Status (Signed)
[place] NOTIFICATION   Patient Details  Name: SILVIO HELSLEY MRN: [id number] Date of Birth: Jul 01, 1941   Medicare Observation Status Notification Given:  Yes    Quayshaun Hubbert, Rory Percy, RN 11/09/2016, 11:58 AM

## 2016-11-09 NOTE — Progress Notes (Signed)
Patients wife demanded patient be discharged immediately. MD notified. MD spoke with patients wife over the telephone. Orders received to discharge patient.

## 2016-11-09 NOTE — Progress Notes (Signed)
Patients wife at bedside. I gave an update regarding the patients care to the patient and wife. Wife began to yell stating, "you are a liar!" I informed patient and wife that this information given was the most updated information that I received to my knowledge. I asked the wife if she would like to speak to the charge nurse and she stated, "she is a liar just like you." The cardiology NP then came to see the pt. And the wife began to yell at her as well. Will continue to monitor pt. And update pt. And family regarding plan of care.

## 2016-11-09 NOTE — Discharge Summary (Signed)
Physician Discharge Summary  Ronald Brewer [id number] DOB: [birth year] DOA: 11/08/2016  PCP: Placido Sou, MD  Admit date: 11/08/2016 Discharge date: 11/09/2016  Recommendations for Outpatient Follow-up:  1. No changes in meds on discharge   Discharge Diagnoses:  Principal Problem:   Anemia Active Problems:   ESRD on dialysis [facility])   Hypokalemia   Chest pain   Neck pain    Discharge Condition: stable   Diet recommendation: as tolerated   History of present illness:   Per HPI 11/08/2016 "75 y.o. male with medical history significant of ESRD on MWF HD, HTN, DM (diet controlled), and CAD presenting with excruciating pain in his left neck for about 3 weeks.  His wife, who was screaming at and/or chastising the patient throughout much of the interview, reports that this is because he sleeps on 3 big pillows.  Pain is beginning to improve after IV pain meds, but now 8/10.  Nothing makes the pain worse.  Also with substernal chest pain for a couple of weeks.  Nothing makes better/worse.  Chest pain is present intermittently.  Non-exertional.  No abdominal pain.  Wife reports that he won't eat.  Had dark stools about 3 weeks ago, lasted for a day or so.  Last BM was yesterday and kind of light brownish.  His wife reports that she is a CNA and that he is NOT having any bleeding.  No SOB.  Also with hip pain."  Hospital Course:   Principal Problem:   Anemia pf chronic disease - No bleeding - Hgb stable at 9.6 - Pt insisting on going home today   Active Problems:   ESRD on dialysis [facility]) - Management per renal    Hypokalemia - Supplemented    Chest pain / Neck pain - Resolved     Signed:  Leisa Lenz, MD  [facility] 11/09/2016, 4:38 PM  Pager #: [phone]  Time spent in minutes: less than 30 minutes  Discharge Exam: Vitals:   11/09/16 0610 11/09/16 0800  BP: (!) 141/51 (!) 158/61  Pulse: 72 76  Resp: 17 16  Temp: 98.7 F (37.1 C) 98.8 F  (37.1 C)   Vitals:   11/08/16 1938 11/08/16 2057 11/09/16 0610 11/09/16 0800  BP: 165/72 (!) 156/67 (!) 141/51 (!) 158/61  Pulse: 78 78 72 76  Resp: 15 18 17 16   Temp:  98.3 F (36.8 C) 98.7 F (37.1 C) 98.8 F (37.1 C)  TempSrc:  Oral Oral Oral  SpO2: 97% 100% 95% 95%  Weight:  77 kg (169 lb 12.1 oz)    Height:  5\' 10"  (1.778 m)      General: Pt is alert, follows commands appropriately, not in acute distress Cardiovascular: Regular rate and rhythm, S1/S2 (+) Respiratory: Clear to auscultation bilaterally, no wheezing, no crackles, no rhonchi Abdominal: Soft, non tender, non distended, bowel sounds +, no guarding Extremities: no edema, no cyanosis, pulses palpable bilaterally DP and PT Neuro: Grossly nonfocal  Discharge Instructions  Discharge Instructions    Call MD for:  difficulty breathing, headache or visual disturbances    Complete by:  As directed    Call MD for:  redness, tenderness, or signs of infection (pain, swelling, redness, odor or green/yellow discharge around incision site)    Complete by:  As directed    Call MD for:  severe uncontrolled pain    Complete by:  As directed    Diet - low sodium heart healthy    Complete by:  As directed  Increase activity slowly    Complete by:  As directed        Medication List    TAKE these medications   ACETAMINOPHEN PO Take 1 tablet by mouth every 6 (six) hours as needed (pain/ headache). Tylenol   aspirin EC 81 MG tablet Take 81 mg by mouth daily.   calcium acetate 667 MG capsule Commonly known as:  PHOSLO Take 2,668 mg by mouth See admin instructions. Take 4 capsules (2668 mg) by mouth with every meal      Follow-up Information    DETERDING,JAMES L, MD. Schedule an appointment as soon as possible for a visit in 1 week(s).   Specialty:  Nephrology Contact information: [address] [place] [address] [phone]            The results of significant diagnostics from this hospitalization  (including imaging, microbiology, ancillary and laboratory) are listed below for reference.    Significant Diagnostic Studies: Dg Chest Portable 1 View  Result Date: 11/08/2016 CLINICAL DATA:  Severe bilateral upper shoulder pain radiating to both sides of the neck. Patient did not complete dialysis today because of the pain. The patient reports feeling week for 4 days with occasional dizziness. EXAM: PORTABLE CHEST 1 VIEW COMPARISON:  PA and lateral chest x-ray of January 07, 2013 FINDINGS: The lungs are mildly hyperinflated. The interstitial markings are increased bilaterally. The pulmonary vascularity is engorged. The cardiac silhouette is enlarged. There is calcification in the wall of the aortic arch. The bony thorax exhibits no acute abnormality. IMPRESSION: Mild pulmonary interstitial edema consistent with CHF. There is no pneumothorax or significant pleural effusion. Thoracic aortic atherosclerosis. Electronically Signed   By: David  Martinique M.D.   On: 11/08/2016 17:20    Microbiology: Recent Results (from the past 240 hour(s))  MRSA PCR Screening     Status: None   Collection Time: 11/09/16  4:38 AM  Result Value Ref Range Status   MRSA by PCR NEGATIVE NEGATIVE Final    Comment:        The GeneXpert MRSA Assay (FDA approved for NASAL specimens only), is one component of a comprehensive MRSA colonization surveillance program. It is not intended to diagnose MRSA infection nor to guide or monitor treatment for MRSA infections.      Labs: Basic Metabolic Panel:  Recent Labs Lab 11/08/16 1620 11/08/16 2109  NA 137  --   K 3.0*  --   CL 95*  --   CO2 31  --   GLUCOSE 82  --   BUN 12  --   CREATININE 5.89*  --   CALCIUM 8.0*  --   MG  --  1.8   Liver Function Tests: No results for input(s): AST, ALT, ALKPHOS, BILITOT, PROT, ALBUMIN in the last 168 hours. No results for input(s): LIPASE, AMYLASE in the last 168 hours. No results for input(s): AMMONIA in the last 168  hours. CBC:  Recent Labs Lab 11/08/16 1620 11/08/16 2315 11/09/16 1000  WBC 7.2 9.3 9.8  HGB 9.6* 9.6* 9.6*  HCT 29.3* 29.5* 29.5*  MCV 86.4 87.0 87.0  PLT 119* 118* 121*   Cardiac Enzymes:  Recent Labs Lab 11/08/16 2315 11/09/16 0535 11/09/16 1000  TROPONINI 0.03* 0.03* 0.03*   BNP: BNP (last 3 results)  Recent Labs  11/08/16 1620  BNP 1,746.7*    ProBNP (last 3 results) No results for input(s): PROBNP in the last 8760 hours.  CBG: No results for input(s): GLUCAP in the last 168  hours.

## 2016-11-09 NOTE — Discharge Instructions (Signed)
Hypokalemia Hypokalemia means that the amount of potassium in the blood is lower than normal.Potassium is a chemical that helps regulate the amount of fluid in the body (electrolyte). It also stimulates muscle tightening (contraction) and helps nerves work properly.Normally, most of the body's potassium is inside of cells, and only a very small amount is in the blood. Because the amount in the blood is so small, minor changes to potassium levels in the blood can be life-threatening. What are the causes? This condition may be caused by:  Antibiotic medicine.  Diarrhea or vomiting. Taking too much of a medicine that helps you have a bowel movement (laxative) can cause diarrhea and lead to hypokalemia.  Chronic kidney disease (CKD).  Medicines that help the body get rid of excess fluid (diuretics).  Eating disorders, such as bulimia.  Low magnesium levels in the body.  Sweating a lot. What are the signs or symptoms? Symptoms of this condition include:  Weakness.  Constipation.  Fatigue.  Muscle cramps.  Mental confusion.  Skipped heartbeats or irregular heartbeat (palpitations).  Tingling or numbness. How is this diagnosed? This condition is diagnosed with a blood test. How is this treated? Hypokalemia can be treated by taking potassium supplements by mouth or adjusting the medicines that you take. Treatment may also include eating more foods that contain a lot of potassium. If your potassium level is very low, you may need to get potassium through an IV tube in one of your veins and be monitored in the hospital. Follow these instructions at home:  Take over-the-counter and prescription medicines only as told by your health care provider. This includes vitamins and supplements.  Eat a healthy diet. A healthy diet includes fresh fruits and vegetables, whole grains, healthy fats, and lean proteins.  If instructed, eat more foods that contain a lot of potassium, such  as:  Nuts, such as peanuts and pistachios.  Seeds, such as sunflower seeds and pumpkin seeds.  Peas, lentils, and lima beans.  Whole grain and bran cereals and breads.  Fresh fruits and vegetables, such as apricots, avocado, bananas, cantaloupe, kiwi, oranges, tomatoes, asparagus, and potatoes.  Orange juice.  Tomato juice.  Red meats.  Yogurt.  Keep all follow-up visits as told by your health care provider. This is important. Contact a health care provider if:  You have weakness that gets worse.  You feel your heart pounding or racing.  You vomit.  You have diarrhea.  You have diabetes (diabetes mellitus) and you have trouble keeping your blood sugar (glucose) in your target range. Get help right away if:  You have chest pain.  You have shortness of breath.  You have vomiting or diarrhea that lasts for more than 2 days.  You faint. This information is not intended to replace advice given to you by your health care provider. Make sure you discuss any questions you have with your health care provider. Document Released: 12/13/2005 Document Revised: 07/31/2016 Document Reviewed: 07/31/2016 Elsevier Interactive Patient Education  2017 [employer]  

## 2016-11-22 ENCOUNTER — Inpatient Hospital Stay (HOSPITAL_COMMUNITY)
Admission: EM | Admit: 2016-11-22 | Discharge: 2016-12-27 | DRG: 308 | Disposition: E | Payer: Medicare Other | Attending: Pulmonary Disease | Admitting: Pulmonary Disease

## 2016-11-22 ENCOUNTER — Inpatient Hospital Stay (HOSPITAL_COMMUNITY): Payer: Medicare Other

## 2016-11-22 ENCOUNTER — Emergency Department (HOSPITAL_COMMUNITY): Payer: Medicare Other

## 2016-11-22 ENCOUNTER — Encounter (HOSPITAL_COMMUNITY): Payer: Self-pay | Admitting: Emergency Medicine

## 2016-11-22 DIAGNOSIS — N186 End stage renal disease: Secondary | ICD-10-CM | POA: Diagnosis not present

## 2016-11-22 DIAGNOSIS — D638 Anemia in other chronic diseases classified elsewhere: Secondary | ICD-10-CM | POA: Diagnosis present

## 2016-11-22 DIAGNOSIS — E876 Hypokalemia: Secondary | ICD-10-CM | POA: Diagnosis present

## 2016-11-22 DIAGNOSIS — Z992 Dependence on renal dialysis: Secondary | ICD-10-CM

## 2016-11-22 DIAGNOSIS — N2581 Secondary hyperparathyroidism of renal origin: Secondary | ICD-10-CM | POA: Diagnosis not present

## 2016-11-22 DIAGNOSIS — I5041 Acute combined systolic (congestive) and diastolic (congestive) heart failure: Secondary | ICD-10-CM | POA: Diagnosis present

## 2016-11-22 DIAGNOSIS — I469 Cardiac arrest, cause unspecified: Secondary | ICD-10-CM | POA: Diagnosis present

## 2016-11-22 DIAGNOSIS — I429 Cardiomyopathy, unspecified: Secondary | ICD-10-CM | POA: Diagnosis present

## 2016-11-22 DIAGNOSIS — I4581 Long QT syndrome: Secondary | ICD-10-CM | POA: Diagnosis present

## 2016-11-22 DIAGNOSIS — D72829 Elevated white blood cell count, unspecified: Secondary | ICD-10-CM | POA: Diagnosis not present

## 2016-11-22 DIAGNOSIS — I472 Ventricular tachycardia: Secondary | ICD-10-CM | POA: Diagnosis present

## 2016-11-22 DIAGNOSIS — Z4582 Encounter for adjustment or removal of myringotomy device (stent) (tube): Secondary | ICD-10-CM

## 2016-11-22 DIAGNOSIS — J9601 Acute respiratory failure with hypoxia: Secondary | ICD-10-CM | POA: Diagnosis not present

## 2016-11-22 DIAGNOSIS — Z4659 Encounter for fitting and adjustment of other gastrointestinal appliance and device: Secondary | ICD-10-CM

## 2016-11-22 DIAGNOSIS — Z515 Encounter for palliative care: Secondary | ICD-10-CM | POA: Diagnosis not present

## 2016-11-22 DIAGNOSIS — K921 Melena: Secondary | ICD-10-CM | POA: Diagnosis present

## 2016-11-22 DIAGNOSIS — E11649 Type 2 diabetes mellitus with hypoglycemia without coma: Secondary | ICD-10-CM | POA: Diagnosis not present

## 2016-11-22 DIAGNOSIS — R570 Cardiogenic shock: Secondary | ICD-10-CM | POA: Diagnosis present

## 2016-11-22 DIAGNOSIS — I471 Supraventricular tachycardia: Secondary | ICD-10-CM | POA: Diagnosis present

## 2016-11-22 DIAGNOSIS — I272 Pulmonary hypertension, unspecified: Secondary | ICD-10-CM | POA: Diagnosis not present

## 2016-11-22 DIAGNOSIS — L299 Pruritus, unspecified: Secondary | ICD-10-CM | POA: Diagnosis present

## 2016-11-22 DIAGNOSIS — G931 Anoxic brain damage, not elsewhere classified: Secondary | ICD-10-CM

## 2016-11-22 DIAGNOSIS — I2510 Atherosclerotic heart disease of native coronary artery without angina pectoris: Secondary | ICD-10-CM | POA: Diagnosis present

## 2016-11-22 DIAGNOSIS — I4901 Ventricular fibrillation: Secondary | ICD-10-CM | POA: Diagnosis not present

## 2016-11-22 DIAGNOSIS — E875 Hyperkalemia: Secondary | ICD-10-CM | POA: Diagnosis not present

## 2016-11-22 DIAGNOSIS — E1122 Type 2 diabetes mellitus with diabetic chronic kidney disease: Secondary | ICD-10-CM | POA: Diagnosis present

## 2016-11-22 DIAGNOSIS — D649 Anemia, unspecified: Secondary | ICD-10-CM | POA: Diagnosis present

## 2016-11-22 DIAGNOSIS — I319 Disease of pericardium, unspecified: Secondary | ICD-10-CM | POA: Diagnosis not present

## 2016-11-22 DIAGNOSIS — I252 Old myocardial infarction: Secondary | ICD-10-CM

## 2016-11-22 DIAGNOSIS — I132 Hypertensive heart and chronic kidney disease with heart failure and with stage 5 chronic kidney disease, or end stage renal disease: Secondary | ICD-10-CM | POA: Diagnosis present

## 2016-11-22 DIAGNOSIS — Z7189 Other specified counseling: Secondary | ICD-10-CM

## 2016-11-22 DIAGNOSIS — J9600 Acute respiratory failure, unspecified whether with hypoxia or hypercapnia: Secondary | ICD-10-CM

## 2016-11-22 DIAGNOSIS — I951 Orthostatic hypotension: Secondary | ICD-10-CM | POA: Diagnosis not present

## 2016-11-22 DIAGNOSIS — Z87891 Personal history of nicotine dependence: Secondary | ICD-10-CM

## 2016-11-22 DIAGNOSIS — R9431 Abnormal electrocardiogram [ECG] [EKG]: Secondary | ICD-10-CM | POA: Diagnosis not present

## 2016-11-22 DIAGNOSIS — Z79818 Long term (current) use of other agents affecting estrogen receptors and estrogen levels: Secondary | ICD-10-CM

## 2016-11-22 DIAGNOSIS — Z66 Do not resuscitate: Secondary | ICD-10-CM | POA: Diagnosis not present

## 2016-11-22 DIAGNOSIS — Z7982 Long term (current) use of aspirin: Secondary | ICD-10-CM

## 2016-11-22 HISTORY — DX: End stage renal disease: N18.6

## 2016-11-22 LAB — TROPONIN I: TROPONIN I: 0.23 ng/mL — AB (ref <0.03)

## 2016-11-22 LAB — POCT I-STAT, CHEM 8
BUN: 4 mg/dL — AB (ref 6–20)
CHLORIDE: 95 mmol/L — AB (ref 101–111)
CREATININE: 4.2 mg/dL — AB (ref 0.61–1.24)
Calcium, Ion: 0.92 mmol/L — ABNORMAL LOW (ref 1.15–1.40)
GLUCOSE: 163 mg/dL — AB (ref 65–99)
HEMATOCRIT: 27 % — AB (ref 39.0–52.0)
Hemoglobin: 9.2 g/dL — ABNORMAL LOW (ref 13.0–17.0)
POTASSIUM: 2.5 mmol/L — AB (ref 3.5–5.1)
Sodium: 139 mmol/L (ref 135–145)
TCO2: 27 mmol/L (ref 0–100)

## 2016-11-22 LAB — CBC
HCT: 26.3 % — ABNORMAL LOW (ref 39.0–52.0)
HEMOGLOBIN: 8.7 g/dL — AB (ref 13.0–17.0)
MCH: 28.2 pg (ref 26.0–34.0)
MCHC: 33.1 g/dL (ref 30.0–36.0)
MCV: 85.4 fL (ref 78.0–100.0)
PLATELETS: 156 10*3/uL (ref 150–400)
RBC: 3.08 MIL/uL — ABNORMAL LOW (ref 4.22–5.81)
RDW: 15.1 % (ref 11.5–15.5)
WBC: 8.7 10*3/uL (ref 4.0–10.5)

## 2016-11-22 LAB — I-STAT ARTERIAL BLOOD GAS, ED
ACID-BASE EXCESS: 6 mmol/L — AB (ref 0.0–2.0)
Bicarbonate: 30.9 mmol/L — ABNORMAL HIGH (ref 20.0–28.0)
O2 SAT: 100 %
PH ART: 7.423 (ref 7.350–7.450)
TCO2: 32 mmol/L (ref 0–100)
pCO2 arterial: 47.3 mmHg (ref 32.0–48.0)
pO2, Arterial: 352 mmHg — ABNORMAL HIGH (ref 83.0–108.0)

## 2016-11-22 LAB — I-STAT CHEM 8, ED
BUN: <3 mg/dL — ABNORMAL LOW (ref 6–20)
CALCIUM ION: 0.86 mmol/L — AB (ref 1.15–1.40)
Chloride: 95 mmol/L — ABNORMAL LOW (ref 101–111)
Creatinine, Ser: 3.8 mg/dL — ABNORMAL HIGH (ref 0.61–1.24)
Glucose, Bld: 114 mg/dL — ABNORMAL HIGH (ref 65–99)
HCT: 31 % — ABNORMAL LOW (ref 39.0–52.0)
HEMOGLOBIN: 10.5 g/dL — AB (ref 13.0–17.0)
Potassium: 3 mmol/L — ABNORMAL LOW (ref 3.5–5.1)
SODIUM: 139 mmol/L (ref 135–145)
TCO2: 26 mmol/L (ref 0–100)

## 2016-11-22 LAB — PROTIME-INR
INR: 1.35
Prothrombin Time: 16.8 seconds — ABNORMAL HIGH (ref 11.4–15.2)

## 2016-11-22 LAB — BASIC METABOLIC PANEL
Anion gap: 14 (ref 5–15)
BUN: <5 mg/dL — ABNORMAL LOW (ref 6–20)
CHLORIDE: 97 mmol/L — AB (ref 101–111)
CO2: 25 mmol/L (ref 22–32)
Calcium: 7.4 mg/dL — ABNORMAL LOW (ref 8.9–10.3)
Creatinine, Ser: 3.86 mg/dL — ABNORMAL HIGH (ref 0.61–1.24)
GFR, EST AFRICAN AMERICAN: 16 mL/min — AB (ref >60)
GFR, EST NON AFRICAN AMERICAN: 14 mL/min — AB (ref >60)
GLUCOSE: 114 mg/dL — AB (ref 65–99)
POTASSIUM: 3 mmol/L — AB (ref 3.5–5.1)
Sodium: 136 mmol/L (ref 135–145)

## 2016-11-22 LAB — GLUCOSE, CAPILLARY
GLUCOSE-CAPILLARY: 129 mg/dL — AB (ref 65–99)
GLUCOSE-CAPILLARY: 148 mg/dL — AB (ref 65–99)
Glucose-Capillary: 168 mg/dL — ABNORMAL HIGH (ref 65–99)
Glucose-Capillary: 170 mg/dL — ABNORMAL HIGH (ref 65–99)

## 2016-11-22 LAB — MRSA PCR SCREENING: MRSA by PCR: NEGATIVE

## 2016-11-22 LAB — MAGNESIUM: MAGNESIUM: 1.9 mg/dL (ref 1.7–2.4)

## 2016-11-22 LAB — CBG MONITORING, ED: GLUCOSE-CAPILLARY: 122 mg/dL — AB (ref 65–99)

## 2016-11-22 LAB — APTT: APTT: 40 s — AB (ref 24–36)

## 2016-11-22 LAB — I-STAT TROPONIN, ED: TROPONIN I, POC: 0.04 ng/mL (ref 0.00–0.08)

## 2016-11-22 LAB — I-STAT CG4 LACTIC ACID, ED: LACTIC ACID, VENOUS: 6.09 mmol/L — AB (ref 0.5–1.9)

## 2016-11-22 MED ORDER — SODIUM CHLORIDE 0.9 % IV BOLUS (SEPSIS): 500.0000 mL | Freq: Once | INTRAVENOUS | Status: DC

## 2016-11-22 MED ORDER — ETOMIDATE 2 MG/ML IV SOLN
INTRAVENOUS | Status: AC | PRN
  Administered 2016-11-22: 20 mg via INTRAVENOUS

## 2016-11-22 MED ORDER — SODIUM CHLORIDE 0.9 % IV SOLN
INTRAVENOUS | Status: DC
  Administered 2016-11-22 – 2016-11-28 (×3): via INTRAVENOUS

## 2016-11-22 MED ORDER — SODIUM CHLORIDE 0.9 % IV SOLN
1.0000 ug/kg/min | INTRAVENOUS | Status: DC
  Administered 2016-11-22 – 2016-11-24 (×2): 1 ug/kg/min via INTRAVENOUS
  Filled 2016-11-22 (×2): qty 20

## 2016-11-22 MED ORDER — NOREPINEPHRINE BITARTRATE 1 MG/ML IV SOLN
0.0000 ug/min | INTRAVENOUS | Status: DC
  Administered 2016-11-22: 5 ug/min via INTRAVENOUS
  Administered 2016-11-23: 10 ug/min via INTRAVENOUS
  Filled 2016-11-22 (×4): qty 4

## 2016-11-22 MED ORDER — ASPIRIN 300 MG RE SUPP
300.0000 mg | RECTAL | Status: AC
  Administered 2016-11-22: 300 mg via RECTAL
  Filled 2016-11-22: qty 1

## 2016-11-22 MED ORDER — ARTIFICIAL TEARS OP OINT
1.0000 "application " | TOPICAL_OINTMENT | Freq: Three times a day (TID) | OPHTHALMIC | Status: DC
  Administered 2016-11-22 – 2016-11-24 (×6): 1 via OPHTHALMIC
  Filled 2016-11-22: qty 3.5

## 2016-11-22 MED ORDER — POTASSIUM CHLORIDE 20 MEQ/15ML (10%) PO SOLN
40.0000 meq | Freq: Once | ORAL | Status: AC
Start: 2016-11-22 — End: 2016-11-22
  Administered 2016-11-22: 40 meq
  Filled 2016-11-22: qty 30

## 2016-11-22 MED ORDER — CISATRACURIUM BOLUS VIA INFUSION
0.0500 mg/kg | INTRAVENOUS | Status: DC | PRN
  Filled 2016-11-22: qty 4

## 2016-11-22 MED ORDER — PANTOPRAZOLE SODIUM 40 MG PO PACK
40.0000 mg | PACK | Freq: Every day | ORAL | Status: DC
  Administered 2016-11-22 – 2016-12-02 (×11): 40 mg
  Filled 2016-11-22 (×12): qty 20

## 2016-11-22 MED ORDER — MIDAZOLAM HCL 2 MG/2ML IJ SOLN
1.0000 mg | Freq: Once | INTRAMUSCULAR | Status: AC
  Administered 2016-11-22: 1 mg via INTRAVENOUS
  Filled 2016-11-22: qty 2

## 2016-11-22 MED ORDER — SODIUM CHLORIDE 0.9 % IV SOLN
2.0000 mg/h | INTRAVENOUS | Status: DC
  Administered 2016-11-22 – 2016-11-23 (×2): 2 mg/h via INTRAVENOUS
  Filled 2016-11-22 (×5): qty 10

## 2016-11-22 MED ORDER — CISATRACURIUM BOLUS VIA INFUSION
0.1000 mg/kg | Freq: Once | INTRAVENOUS | Status: AC
  Administered 2016-11-22: 7.7 mg via INTRAVENOUS
  Filled 2016-11-22: qty 8

## 2016-11-22 MED ORDER — MIDAZOLAM BOLUS VIA INFUSION
1.0000 mg | INTRAVENOUS | Status: DC | PRN
  Filled 2016-11-22: qty 1

## 2016-11-22 MED ORDER — ROCURONIUM BROMIDE 50 MG/5ML IV SOLN
INTRAVENOUS | Status: AC | PRN
  Administered 2016-11-22: 100 mg via INTRAVENOUS

## 2016-11-22 MED ORDER — CHLORHEXIDINE GLUCONATE 0.12% ORAL RINSE (MEDLINE KIT)
15.0000 mL | Freq: Two times a day (BID) | OROMUCOSAL | Status: DC
  Administered 2016-11-22 – 2016-11-23 (×3): 15 mL via OROMUCOSAL

## 2016-11-22 MED ORDER — FENTANYL 2500MCG IN NS 250ML (10MCG/ML) PREMIX INFUSION
100.0000 ug/h | INTRAVENOUS | Status: DC
  Administered 2016-11-22: 100 ug/h via INTRAVENOUS
  Administered 2016-11-23 – 2016-11-24 (×2): 150 ug/h via INTRAVENOUS
  Filled 2016-11-22 (×4): qty 250

## 2016-11-22 MED ORDER — HEPARIN SODIUM (PORCINE) 5000 UNIT/ML IJ SOLN
5000.0000 [IU] | Freq: Three times a day (TID) | INTRAMUSCULAR | Status: DC
  Administered 2016-11-22 – 2016-12-02 (×30): 5000 [IU] via SUBCUTANEOUS
  Filled 2016-11-22 (×29): qty 1

## 2016-11-22 MED ORDER — FENTANYL CITRATE (PF) 100 MCG/2ML IJ SOLN
50.0000 ug | Freq: Once | INTRAMUSCULAR | Status: AC
  Administered 2016-11-22: 50 ug via INTRAVENOUS
  Filled 2016-11-22: qty 2

## 2016-11-22 MED ORDER — ASPIRIN 300 MG RE SUPP: 300.0000 mg | RECTAL | Status: DC

## 2016-11-22 MED ORDER — SODIUM CHLORIDE 0.9 % IV SOLN
Freq: Once | INTRAVENOUS | Status: AC
  Administered 2016-11-22: 21:00:00 via INTRAVENOUS
  Filled 2016-11-22 (×2): qty 1000

## 2016-11-22 MED ORDER — FENTANYL BOLUS VIA INFUSION
25.0000 ug | INTRAVENOUS | Status: DC | PRN
  Filled 2016-11-22: qty 25

## 2016-11-22 MED ORDER — ORAL CARE MOUTH RINSE
15.0000 mL | OROMUCOSAL | Status: DC
  Administered 2016-11-22 – 2016-11-23 (×9): 15 mL via OROMUCOSAL

## 2016-11-22 NOTE — Procedures (Signed)
Arterial Catheter Insertion Procedure Note Ronald Brewer [id number] October 27, 1941  Procedure: Insertion of Arterial Catheter  Indications: Blood pressure monitoring  Procedure Details Consent: Unable to obtain consent because of emergent medical necessity. Time Out: Verified patient identification, verified procedure, site/side was marked, verified correct patient position, special equipment/implants available, medications/allergies/relevent history reviewed, required imaging and test results available.  Performed  Maximum sterile technique was used including antiseptics, cap, gloves, gown, hand hygiene, mask and sheet. Skin prep: Chlorhexidine; local anesthetic administered 22 gauge catheter was inserted into right radial artery using the Seldinger technique.  Evaluation Blood flow good; BP tracing good. Complications: No apparent complications.   Ronald Brewer 11/18/2016

## 2016-11-22 NOTE — Progress Notes (Signed)
Patient came in via EMS intubated with a King airway due to cardiac arrest, ED physician replaced [facility] airway with a 7.5 ETT taped at 24 cm at lip, good color change on ETCO2 detector, good BBS ausculted over lung fields, SATS 100% chest X-Ray confirmed placement, ABG obtained after intubation, will continue to monitor patient.

## 2016-11-22 NOTE — ED Notes (Signed)
CCM at bedside. Decision to cool patient made, arctic sun pads placed on patient. Arctic sun not to be turned on until sedation medications arrive.

## 2016-11-22 NOTE — H&P (Signed)
PULMONARY / CRITICAL CARE MEDICINE   Name: Ronald Brewer MRN: [id number] DOB: 1941/12/09    ADMISSION DATE:  11/21/2016 CONSULTATION DATE:  11/02/2016   CHIEF COMPLAINT:  Cardiac arrest  HISTORY OF PRESENT ILLNESS:   75 year old man with ESRD on hemodialysis and CAD was brought in by EMS after a witnessed cardiac arrest at dialysis 11/27. He finished his treatment, then had a syncopal episode and vomited, again syncope with loss of pulse-shock advised the AED. After EMS arrival, noted to be in V. fib given epinephrine 1 with ROSC, total CPR time about 15 minutes. He is a diabetic hypertensive neck: 2011 shows normal ejection fraction He was admitted 10/2016 for generalized weakness, black stools and hemoglobin about 9 attributed to anemia of chronic disease He remained unresponsive after arrival to the emergency room had nonpurposeful movement of both upper extremities EKG showed prolonged QT, frequent PVCs   PAST MEDICAL HISTORY :  He  has a past medical history of Anemia; Coronary artery disease; Cough; Diabetes mellitus; Dizziness; End stage renal disease ([place]); Gastritis; Hypertension; Insomnia; Myocardial infarction; Occult blood in stools; Shortness of breath; and Weakness generalized.  PAST SURGICAL HISTORY: He  has a past surgical history that includes Esophagogastroduodenoscopy (07/27/2012) and Colonoscopy (07/27/2012).  Allergies  Allergen Reactions  . Tape Itching and Rash    Please use "paper" tape only.    No current facility-administered medications on file prior to encounter.    Current Outpatient Prescriptions on File Prior to Encounter  Medication Sig  . ACETAMINOPHEN PO Take 1 tablet by mouth every 6 (six) hours as needed (pain/ headache). Tylenol  . aspirin EC 81 MG tablet Take 81 mg by mouth daily.  . calcium acetate (PHOSLO) 667 MG capsule Take 2,668 mg by mouth See admin instructions. Take 4 capsules (2668 mg) by mouth with every meal    FAMILY HISTORY:  His  has no family status information on file.    SOCIAL HISTORY: He  reports that he quit smoking about 50 years ago. He has quit using smokeless tobacco. He reports that he does not drink alcohol or use drugs.  REVIEW OF SYSTEMS:   Unable to obtain since unresponsive   SUBJECTIVE:    VITAL SIGNS: BP 144/76   Pulse 89   Temp 98.4 F (36.9 C) (Rectal)   Resp 20   SpO2 100%   HEMODYNAMICS:    VENTILATOR SETTINGS: Vent Mode: PRVC FiO2 (%):  [100 %] 100 % Set Rate:  [20 bmp] 20 bmp Vt Set:  [500 mL] 500 mL PEEP:  [5 cmH20] 5 cmH20 Plateau Pressure:  [18 cmH20] 18 cmH20  INTAKE / OUTPUT: No intake/output data recorded.  PHYSICAL EXAMINATION:   General:  chronically ill-appearing elderly  intubated ,  Neuro:  Unresponsive, GCS 3 HEENT:  Oral ET tube, pupils 3 mm bilaterally equally reactive to light  S1-S2 normal, no rub, no murmur Decreased breath sounds bilateral, no rhonchi   soft and nontender abdomen Extremities-left AV fistula with thrill, right femoral CVL   LABS:  BMET  Recent Labs Lab 11/12/2016 1722 11/06/2016 1732  NA 136 139  K 3.0* 3.0*  CL 97* 95*  CO2 25  --   BUN <5* <3*  CREATININE 3.86* 3.80*  GLUCOSE 114* 114*    Electrolytes  Recent Labs Lab 11/13/2016 1722  CALCIUM 7.4*    CBC  Recent Labs Lab 11/13/2016 1722 10/31/2016 1732  WBC 8.7  --   HGB 8.7* 10.5*  HCT 26.3* 31.0*  PLT 156  --     Coag's  Recent Labs Lab 11/18/2016 1722  APTT 40*  INR 1.35    Sepsis Markers  Recent Labs Lab 10/30/2016 1732  LATICACIDVEN 6.09*    ABG  Recent Labs Lab 10/28/2016 1723  PHART 7.423  PCO2ART 47.3  PO2ART 352.0*    Liver Enzymes No results for input(s): AST, ALT, ALKPHOS, BILITOT, ALBUMIN in the last 168 hours.  Cardiac Enzymes No results for input(s): TROPONINI, PROBNP in the last 168 hours.  Glucose  Recent Labs Lab 11/05/2016 1720  GLUCAP 122*    Imaging Dg Chest Portable 1 View  Result Date: 11/21/2016 CLINICAL  DATA:  Endotracheal tube insertion EXAM: PORTABLE CHEST 1 VIEW COMPARISON:  11/08/2016 FINDINGS: The tip of an endotracheal tube is approximately 3.5 cm above the carina. External defibrillator paddle projects over the left hemi thorax. There appears to be gastric tube with tip just below the left hemidiaphragm and sideport 8 cm above the diaphragm. Electronic monitoring device projects over the right hemi thorax. Additional support tubes/lines project over the right upper quadrant of the abdomen and from below, projecting up to the distal SVC. The heart is borderline enlarged. There is mild interstitial edema. There is no pneumothorax. IMPRESSION: Endotracheal tube tip in satisfactory position approximately 3.5 cm above the carina. What appears to be a a gastric tube is noted with side port above the diaphragm by 8 cm. Cardiomegaly with mild interstitial edema. Other support lines and tubes as above. Electronically Signed   By: Ashley Royalty M.D.   On: 11/10/2016 18:10   Dg Abd Portable 1 View  Result Date: 11/04/2016 CLINICAL DATA:  Right femoral line insertion. EXAM: PORTABLE ABDOMEN - 1 VIEW COMPARISON:  None. FINDINGS: Single portable view of the abdomen and pelvis. This demonstrates right femoral line projecting over the upper sacrum, in the region of the common iliac vessels. Borderline gastric distension. Otherwise unremarkable bowel gas pattern. Vascular calcifications. IMPRESSION: Right femoral line projecting with tip in the expected location of the common iliac vessels. Electronically Signed   By: Abigail Miyamoto M.D.   On: 11/07/2016 18:19     STUDIES:  Echo 11/28>>  CULTURES:   ANTIBIOTICS:   SIGNIFICANT EVENTS: 11/27 admit  LINES/TUBES: 11/27 rt fem CVL >> 11/27 ETT >>  DISCUSSION:  Cardiac arrest due to ventricular fibrillation in the setting of prolonged QT this may be due to postdialysis shift electrolytes , medication such as Zoloft, intrinsic cardiac disease, acute ischemia  will need to be ruled out   PULMONARY A:  acute respiratory failure P:   Prvc, vent settings were reviewed and adjusted   keep respiratory rate at 20  CARDIOVASCULAR A:   ventricle fibrillation Prolonged QT P:  Cardiology following Follow QT Replete electrolytes Cycle troponins Check echo  RENAL A:    hypokalemia- not sure whether to believe this immediately post dialysis ESRD P:   Follow be met every 4 hours and replete as needed   GI A:    no issues P:   Advance or G-tube by 8 cm  Heme A:    anemia of chronic disease P:  Follow  ID A:    no issues P:   Concern for aspiration but hold off antibiotics for now   EndoCRINE A:   DM-2   P:   SSI  NEUROLOGIC A:   At ris anoxic encephalopathy  P:   RASS goal: -5 hypothermia protocol 33   FAMILY  - Updates:  none at bedside   - Inter-disciplinary family meet or Palliative Care meeting due by:  day 7    SUmmary - will initiate cooling protocol to 33, at risk for further arrhythmias due to prolonged QT   The patient is critically ill with multiple organ systems failure and requires high complexity decision making for assessment and support, frequent evaluation and titration of therapies, application of advanced monitoring technologies and extensive interpretation of multiple databases. Critical Care Time devoted to patient care services described in this note independent of APP time is 55 minutes.    Kara Mead MD. Shade Flood. [facility] Pager [phone] If no response call [phone]    11/09/2016, 6:46 PM

## 2016-11-22 NOTE — Progress Notes (Signed)
I-Stat chem8 results not currently crossing over into results review in epic. K at 2030 was < 2.0 new orders received; K at 2200 2.5 K currently infusing.

## 2016-11-22 NOTE — ED Notes (Signed)
OG tube advanced approximately 8 cm.

## 2016-11-22 NOTE — ED Provider Notes (Signed)
[facility] DEPT Provider Note   CSN: [id number] Arrival date & time: 10/27/2016  1654     History   Chief Complaint Chief Complaint  Patient presents with  . Cardiac Arrest    HPI Ronald Brewer is a 75 y.o. male.  The history is provided by the EMS personnel and medical records.  Patient is a 75 year old male with past medical history of ?CAD, diabetes, end-stage renal disease on HD, who presents after a cardiac arrest. History is limited as patient is intubated on arrival. Further history obtained from EMS and medical records. Per report patient was at dialysis and had finished his entire 4 hour session when he had a syncopal episode followed by episode of emesis. He subsequently had a second syncopal episode and lost pulses. He was noted to be in pulseless ventricular fibrillation and received shock 1 from AED. He received a total of about 15 minutes of CPR. On EMS arrival, patient in PEA. He received epi 1 before ROSC was achieved. He was then bradycardic in the 30s and he was transcutaneously paced with a rate of 60 prior to arrival. A King airway was placed prior to arrival.   Past Medical History:  Diagnosis Date  . Anemia   . Coronary artery disease   . Cough   . Diabetes mellitus    25 years ago  . Dizziness   . End stage renal disease ([place])    esrd  . Gastritis   . Hypertension   . Insomnia   . Myocardial infarction   . Occult blood in stools   . Shortness of breath   . Weakness generalized     Patient Active Problem List   Diagnosis Date Noted  . Cardiac arrest with ventricular fibrillation ([place]) 10/30/2016  . Prolonged QT interval 11/18/2016  . Cardiac arrest ([place]) 11/08/2016  . Anemia 11/08/2016  . Hypokalemia 11/08/2016  . Chest pain 11/08/2016  . Neck pain 11/08/2016  . ESRD on dialysis ([place]) 09/18/2015  . 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 Ischemic stroke ([place]) 09/18/2015  . Headache 09/18/2015  . Polyneuropathy ([place]) 09/18/2015  .  Diplopia 09/18/2015    Past Surgical History:  Procedure Laterality Date  . COLONOSCOPY  07/27/2012   Procedure: COLONOSCOPY;  Surgeon: Winfield Cunas., MD;  Location: [facility];  Service: Endoscopy;  Laterality: N/A;  . ESOPHAGOGASTRODUODENOSCOPY  07/27/2012   Procedure: ESOPHAGOGASTRODUODENOSCOPY (EGD);  Surgeon: Winfield Cunas., MD;  Location: [facility];  Service: Endoscopy;  Laterality: N/A;       Home Medications    Prior to Admission medications   Medication Sig Start Date End Date Taking? Authorizing Provider  amLODipine (NORVASC) 10 MG tablet Take 10 mg by mouth daily.   Yes Historical Provider, MD  aspirin EC 81 MG tablet Take 81 mg by mouth daily.   Yes Historical Provider, MD  folic acid-vitamin b complex-vitamin c-selenium-zinc (DIALYVITE) 3 MG TABS tablet Take 1 tablet by mouth daily.   Yes Historical Provider, MD  megestrol (MEGACE) 400 MG/10ML suspension Take 400 mg by mouth 2 (two) times daily.   Yes Historical Provider, MD  naproxen (NAPROSYN) 500 MG tablet Take 500 mg by mouth 2 (two) times daily with a meal.   Yes Historical Provider, MD  sertraline (ZOLOFT) 50 MG tablet Take 50 mg by mouth daily.   Yes Historical Provider, MD  ACETAMINOPHEN PO Take 1 tablet by mouth every 6 (six) hours as needed (pain/ headache). Tylenol  Historical Provider, MD  calcium acetate (PHOSLO) 667 MG capsule Take 2,668 mg by mouth See admin instructions. 2,668 mg with each meal and 1,334 mg two times daily with each snack    Historical Provider, MD    Family History Family History  Problem Relation Age of Onset  . Family history unknown: Yes    Social History Social History  Substance Use Topics  . Smoking status: Former Smoker    Quit date: 1967  . Smokeless tobacco: Former Neurosurgeon  . Alcohol use No     Allergies   Lisinopril and Tape   Review of Systems Review of Systems  Unable to perform ROS: Intubated     Physical Exam Updated Vital Signs BP 122/61  (BP Location: Right Arm)   Pulse 67   Temp (!) 91.9 F (33.3 C)   Resp 20   Ht 5\' 10"  (1.778 m)   Wt 77 kg   SpO2 100%   BMI 24.36 kg/m   Physical Exam  Constitutional:  Appears chronically ill. King airway in place. Patient occasionally attempting to breathe over the ventilator. Small movements of both upper extremities.  HENT:  Head: Normocephalic and atraumatic.  Eyes:  Pupils are equal and minimally reactive to light  Neck: Normal range of motion. Neck supple.  Cardiovascular: Regular rhythm and intact distal pulses.  Tachycardia present.   Pulmonary/Chest:  Bilateral breath sounds present on the ventilator. Rhonchi present.   Abdominal: Soft. He exhibits no distension.  Musculoskeletal: He exhibits no edema.  Neurological:  Intubated. Obtunded.  Skin:  Cool, dry     ED Treatments / Results  Labs (all labs ordered are listed, but only abnormal results are displayed) Labs Reviewed  BASIC METABOLIC PANEL - Abnormal; Notable for the following:       Result Value   Potassium 3.0 (*)    Chloride 97 (*)    Glucose, Bld 114 (*)    BUN <5 (*)    Creatinine, Ser 3.86 (*)    Calcium 7.4 (*)    GFR calc non Af Amer 14 (*)    GFR calc Af Amer 16 (*)    All other components within normal limits  CBC - Abnormal; Notable for the following:    RBC 3.08 (*)    Hemoglobin 8.7 (*)    HCT 26.3 (*)    All other components within normal limits  APTT - Abnormal; Notable for the following:    aPTT 40 (*)    All other components within normal limits  PROTIME-INR - Abnormal; Notable for the following:    Prothrombin Time 16.8 (*)    All other components within normal limits  TROPONIN I - Abnormal; Notable for the following:    Troponin I 0.23 (*)    All other components within normal limits  TROPONIN I - Abnormal; Notable for the following:    Troponin I 0.70 (*)    All other components within normal limits  GLUCOSE, CAPILLARY - Abnormal; Notable for the following:     Glucose-Capillary 129 (*)    All other components within normal limits  GLUCOSE, CAPILLARY - Abnormal; Notable for the following:    Glucose-Capillary 148 (*)    All other components within normal limits  GLUCOSE, CAPILLARY - Abnormal; Notable for the following:    Glucose-Capillary 168 (*)    All other components within normal limits  GLUCOSE, CAPILLARY - Abnormal; Notable for the following:    Glucose-Capillary 170 (*)    All  other components within normal limits  GLUCOSE, CAPILLARY - Abnormal; Notable for the following:    Glucose-Capillary 175 (*)    All other components within normal limits  GLUCOSE, CAPILLARY - Abnormal; Notable for the following:    Glucose-Capillary 175 (*)    All other components within normal limits  CBG MONITORING, ED - Abnormal; Notable for the following:    Glucose-Capillary 122 (*)    All other components within normal limits  I-STAT ARTERIAL BLOOD GAS, ED - Abnormal; Notable for the following:    pO2, Arterial 352.0 (*)    Bicarbonate 30.9 (*)    Acid-Base Excess 6.0 (*)    All other components within normal limits  I-STAT CHEM 8, ED - Abnormal; Notable for the following:    Potassium 3.0 (*)    Chloride 95 (*)    BUN <3 (*)    Creatinine, Ser 3.80 (*)    Glucose, Bld 114 (*)    Calcium, Ion 0.86 (*)    Hemoglobin 10.5 (*)    HCT 31.0 (*)    All other components within normal limits  I-STAT CG4 LACTIC ACID, ED - Abnormal; Notable for the following:    Lactic Acid, Venous 6.09 (*)    All other components within normal limits  POCT I-STAT, CHEM 8 - Abnormal; Notable for the following:    Potassium 2.5 (*)    Chloride 95 (*)    BUN 4 (*)    Creatinine, Ser 4.20 (*)    Glucose, Bld 163 (*)    Calcium, Ion 0.92 (*)    Hemoglobin 9.2 (*)    HCT 27.0 (*)    All other components within normal limits  POCT I-STAT, CHEM 8 - Abnormal; Notable for the following:    Potassium 2.8 (*)    Chloride 97 (*)    BUN 4 (*)    Creatinine, Ser 4.20 (*)     Glucose, Bld 178 (*)    Calcium, Ion 0.86 (*)    Hemoglobin 9.2 (*)    HCT 27.0 (*)    All other components within normal limits  MRSA PCR SCREENING  MAGNESIUM  BLOOD GAS, ARTERIAL  TROPONIN I  PROTIME-INR  APTT  BLOOD GAS, ARTERIAL  BLOOD GAS, ARTERIAL  CBC  MAGNESIUM  PHOSPHORUS  BLOOD GAS, ARTERIAL  BASIC METABOLIC PANEL  BASIC METABOLIC PANEL  BASIC METABOLIC PANEL  BASIC METABOLIC PANEL  BASIC METABOLIC PANEL  I-STAT CG4 LACTIC ACID, ED  I-STAT TROPOININ, ED  CBG MONITORING, ED    EKG  EKG Interpretation  Date/Time:  Monday November 22 2016 18:07:33 EST Ventricular Rate:  88 PR Interval:    QRS Duration: 96 QT Interval:  491 QTC Calculation: 595 R Axis:   -54 Text Interpretation:  Sinus rhythm Prolonged PR interval RSR' in V1 or V2, right VCD or RVH Inferior infarct, old Prolonged QT interval Confirmed by RAY MD, DANIELLE [phone]) on 11/14/2016 6:10:06 PM       Radiology Dg Chest Portable 1 View  Result Date: 11/24/2016 CLINICAL DATA:  Endotracheal tube insertion EXAM: PORTABLE CHEST 1 VIEW COMPARISON:  11/08/2016 FINDINGS: The tip of an endotracheal tube is approximately 3.5 cm above the carina. External defibrillator paddle projects over the left hemi thorax. There appears to be gastric tube with tip just below the left hemidiaphragm and sideport 8 cm above the diaphragm. Electronic monitoring device projects over the right hemi thorax. Additional support tubes/lines project over the right upper quadrant of the abdomen and from  below, projecting up to the distal SVC. The heart is borderline enlarged. There is mild interstitial edema. There is no pneumothorax. IMPRESSION: Endotracheal tube tip in satisfactory position approximately 3.5 cm above the carina. What appears to be a a gastric tube is noted with side port above the diaphragm by 8 cm. Cardiomegaly with mild interstitial edema. Other support lines and tubes as above. Electronically Signed   By: Ashley Royalty  M.D.   On: 11/06/2016 18:10   Dg Abd Portable 1v  Result Date: 11/04/2016 CLINICAL DATA:  Orogastric tube placement EXAM: PORTABLE ABDOMEN - 1 VIEW COMPARISON:  11/06/2016 at 1800 hours FINDINGS: The tip and side port of the gastric tube project to be the left hemidiaphragm in the expected location of the gastric cardia and proximal body of the stomach. There is no free air nor bowel obstruction. No acute osseous abnormality. Right femoral line projects over the right sacral ala as before. External defibrillator paddle noted over the left lung base. IMPRESSION: Gastric tube tip and side port in stomach. Stable position of right femoral line. Electronically Signed   By: Ashley Royalty M.D.   On: 11/20/2016 21:55   Dg Abd Portable 1 View  Result Date: 10/28/2016 CLINICAL DATA:  Right femoral line insertion. EXAM: PORTABLE ABDOMEN - 1 VIEW COMPARISON:  None. FINDINGS: Single portable view of the abdomen and pelvis. This demonstrates right femoral line projecting over the upper sacrum, in the region of the common iliac vessels. Borderline gastric distension. Otherwise unremarkable bowel gas pattern. Vascular calcifications. IMPRESSION: Right femoral line projecting with tip in the expected location of the common iliac vessels. Electronically Signed   By: Abigail Miyamoto M.D.   On: 10/28/2016 18:19    Procedures Procedure Name: Intubation Date/Time: 11/23/2016 1:25 AM Performed by: Tamala Julian, Juniper Cobey B Pre-anesthesia Checklist: Patient identified, Emergency Drugs available, Suction available, Patient being monitored and Timeout performed Oxygen Delivery Method: Ambu bag Preoxygenation: Pre-oxygenation with 100% oxygen Intubation Type: Rapid sequence Laryngoscope Size: Glidescope Grade View: Grade I Tube size: 7.5 mm Number of attempts: 1 Airway Equipment and Method: Video-laryngoscopy and Stylet Placement Confirmation: ETT inserted through vocal cords under direct vision,  CO2 detector and Breath sounds  checked- equal and bilateral Secured at: 23 cm Tube secured with: ETT holder Future Recommendations: Recommend- induction with short-acting agent, and alternative techniques readily available    .Central Line Date/Time: 11/23/2016 1:26 AM Performed by: Tamala Julian, Lacara Dunsworth B Authorized by: Darnelle Bos B   Consent:    Consent obtained:  Emergent situation Pre-procedure details:    Hand hygiene: Hand hygiene performed prior to insertion     Sterile barrier technique: All elements of maximal sterile technique followed     Skin preparation:  ChloraPrep   Skin preparation agent: Skin preparation agent completely dried prior to procedure   Anesthesia (see MAR for exact dosages):    Anesthesia method:  None Procedure details:    Location:  R femoral   Site selection rationale:  Emergent line needed, patient unstable post v-fib arrest   Patient position:  Flat   Procedural supplies:  Triple lumen   Catheter size:  7 Fr   Landmarks identified: yes     Ultrasound guidance: yes     Sterile ultrasound techniques: Sterile gel and sterile probe covers were used     Number of attempts:  2   Successful placement: yes   Post-procedure details:    Post-procedure:  Dressing applied and line sutured   Assessment:  Blood return through  all ports, placement verified by x-ray and free fluid flow   Patient tolerance of procedure:  Tolerated well, no immediate complications   (including critical care time)  Medications Ordered in ED Medications  norepinephrine (LEVOPHED) 4 mg in dextrose 5 % 250 mL (0.016 mg/mL) infusion (8 mcg/min Intravenous Rate/Dose Change 11/23/16 0100)  cisatracurium (NIMBEX) bolus via infusion 7.7 mg (7.7 mg Intravenous Bolus from Bag 11/13/2016 1956)    And  cisatracurium (NIMBEX) 200 mg in sodium chloride 0.9 % 200 mL (1 mg/mL) infusion (1 mcg/kg/min  77 kg Intravenous New Bag/Given 11/06/2016 1955)    And  cisatracurium (NIMBEX) bolus via infusion 3.9 mg (not administered)    artificial tears (LACRILUBE) ophthalmic ointment 1 application (1 application Both Eyes Given 10/30/2016 2110)  heparin injection 5,000 Units (5,000 Units Subcutaneous Given 11/18/2016 2134)  0.9 %  sodium chloride infusion ( Intravenous Transfusing/Transfer 11/12/2016 1927)  fentaNYL 2557mg in NS 2597m(1049mml) infusion-PREMIX (150 mcg/hr Intravenous Rate/Dose Change 10/31/2016 2158)  fentaNYL (SUBLIMAZE) bolus via infusion 25 mcg (not administered)  midazolam (VERSED) 50 mg in sodium chloride 0.9 % 50 mL (1 mg/mL) infusion (2 mg/hr Intravenous Transfusing/Transfer 11/01/2016 1927)  midazolam (VERSED) bolus via infusion 1 mg (not administered)  pantoprazole sodium (PROTONIX) 40 mg/20 mL oral suspension 40 mg (40 mg Per Tube Given 11/08/2016 2105)  chlorhexidine gluconate (MEDLINE KIT) (PERIDEX) 0.12 % solution 15 mL (15 mLs Mouth Rinse Given 10/29/2016 2113)  MEDLINE mouth rinse (15 mLs Mouth Rinse Given 11/23/16 0016)  calcium gluconate 1 g in sodium chloride 0.9 % 100 mL IVPB (1 g Intravenous Given 11/23/16 0111)  etomidate (AMIDATE) injection (20 mg Intravenous Given 11/03/2016 1710)  rocuronium (ZEMURON) injection (100 mg Intravenous Given 11/21/2016 1711)  aspirin suppository 300 mg (300 mg Rectal Given 11/10/2016 2134)  fentaNYL (SUBLIMAZE) injection 50 mcg (50 mcg Intravenous Given 11/23/2016 1911)  midazolam (VERSED) injection 1 mg (1 mg Intravenous Given 11/18/2016 1911)  potassium chloride 20 MEQ/15ML (10%) solution 40 mEq (40 mEq Per Tube Given 10/30/2016 2032)  sodium chloride 0.9 % 1,000 mL with potassium chloride 80 mEq infusion ( Intravenous Given 11/03/2016 2127)     Initial Impression / Assessment and Plan / ED Course  I have reviewed the triage vital signs and the nursing notes.  Pertinent labs & imaging results that were available during my care of the patient were reviewed by me and considered in my medical decision making (see chart for details).  Clinical Course    Patient is a  [age]ar old male with past medical history as above who presents after a cardiac arrest. On arrival patient is intubated with a King airway and ventilating appropriately. Initially hypertensive. Strong femoral pulses palpated. Transcutaneous pacing was discontinued. Sinus rhythm with prolonged QT and intermittent bigeminy on telemetry. IV access was obtained. King airway was exchanged for endotracheal tube. Please see above procedure note. Right femoral line was placed for additional access. Please see the procedure note. Cooling protocol initiated. EKG shows sinus rhythm, right ventricular conduction delay, markedly prolonged QTC. Intensive care and cardiology consulted. Recommend cooling protocol. Patient will be admitted to the ICU under Dr. AlvBari Mantisre for further management. Patient's wife arrived, and I attempted to update her on patient's status. However, wife was very vocal about her disapproval of the dialysis center and did not want to receive any other updates.  Transferred to the ICU without further acute incidents during my care. Re-examined just prior to transfer and patient remains in critical condition but stable for  transfer.  Patient care was supervised by Dr. Jeanell Sparrow, ED attending, who supervised all procedures.  Final Clinical Impressions(s) / ED Diagnoses   Final diagnoses:  Cardiac arrest Hypoxic respiratory failure Prolonged QT    New Prescriptions Current Discharge Medication List       Gibson Ramp, MD 11/23/16 9470    Pattricia Boss, MD 11/25/16 [phone]

## 2016-11-22 NOTE — Code Documentation (Signed)
Pt arriving to ER by [facility] from dialysis as POST CPR, per staff patient finished treatment and suddenly experienced a syncopal episode, came to and vomited, proceeded to have second syncopal episode resulting in loss of pulses. CPR was initiated, one shock was advised by AED. On EMS arrival patient was in V Fib, one epi was given with return of ROSC. Total CPR time approximately 15 minutes. On arrival patient has strong pulses, per EMS on epi drip at this time. King airway in place, I/O present to right tibia. Pt placed on zoll on arrival.

## 2016-11-22 NOTE — Consult Note (Signed)
Cardiology Consultation Note    Patient ID: Ronald Brewer, MRN: [id number], DOB/AGE: 01/23/1941 75 y.o. Admit date: 11/24/2016   Date of Consult: 11/09/2016 Primary Physician: Placido Sou, MD Primary Cardiologist: New ? Unknown if patient sees an outside cardiologist.  Chief Complaint: cardiac arrest Reason for Consultation: cardiac arrest Requesting MD: Dr. Jeanell Sparrow  HPI: Ronald Brewer is a 75 y.o. male with history of ESRD on HD, DM, HTN, gastritis, ? CAD entered in chart but no details available who presented to [facility] as a cardiac arrest. History obtained from the chart and talking to ED staff. There are no prior cardiology reports available so it's unclear where dx of CAD came from. 2D echo 2011 showed normal LVF. He was admitted earlier this month for weakness, SOB, black stools, chest pain, and left neck pain x 3 weeks. At that time troponins were flat at 0.03. 12 lead EKG showed QTc 522, another tracing with freq PVCs. Hgb also noted to be in the 9 range with fluctuating Hgb previously - 13 in 2016/2017, prev 10-12 in other years. Sx resolved and patient insisted on discharge, no evidence of bleeding during his stay. Per report the patient was at dialysis when he experienced a syncopal spell, came to and vomited, proceeded to have second syncopal episode resulting in loss of pulses. CPR was initiated, one shock was advised by AED. On EMS arrival patient was in V Fib, one epi was given with return of ROSC. Total CPR time approximately 15 minutes. King airway placed, arrived on epi drip. On arrival the patient had strong pulses but was noted to have frequent ectopy on telemetry. Markedly hypertensive by initial BP, subsequent BP coming down to normal. Labs showed K 3.0, lactate 6, glucose 114, Hgb 8.7. Telemetry showing NSR with profound QT prolongation of 525ms.  Past Medical History:  Diagnosis Date  . Anemia   . Coronary artery disease   . Cough   . Diabetes mellitus    25 years ago  .  Dizziness   . End stage renal disease ([place])    esrd  . Gastritis   . Hypertension   . Insomnia   . Myocardial infarction   . Occult blood in stools   . Shortness of breath   . Weakness generalized       Surgical History:  Past Surgical History:  Procedure Laterality Date  . COLONOSCOPY  07/27/2012   Procedure: COLONOSCOPY;  Surgeon: Winfield Cunas., MD;  Location: [facility];  Service: Endoscopy;  Laterality: N/A;  . ESOPHAGOGASTRODUODENOSCOPY  07/27/2012   Procedure: ESOPHAGOGASTRODUODENOSCOPY (EGD);  Surgeon: Winfield Cunas., MD;  Location: [facility];  Service: Endoscopy;  Laterality: N/A;     Home Meds: Prior to Admission medications   Medication Sig Start Date End Date Taking? Authorizing Provider  ACETAMINOPHEN PO Take 1 tablet by mouth every 6 (six) hours as needed (pain/ headache). Tylenol    Historical Provider, MD  aspirin EC 81 MG tablet Take 81 mg by mouth daily.    Historical Provider, MD  calcium acetate (PHOSLO) 667 MG capsule Take 2,668 mg by mouth See admin instructions. Take 4 capsules (2668 mg) by mouth with every meal    Historical Provider, MD    Inpatient Medications:  . aspirin  300 mg Rectal NOW   . sodium chloride      Allergies:  Allergies  Allergen Reactions  . Tape Itching and Rash    Please use "paper" tape only.  Social History   Social History  . Marital status: Married    Spouse name: N/A  . Number of children: N/A  . Years of education: N/A   Occupational History  . retired    Social History Main Topics  . Smoking status: Former Smoker    Quit date: 1967  . Smokeless tobacco: Former Systems developer  . Alcohol use No  . Drug use: No  . Sexual activity: Not on file   Other Topics Concern  . Not on file   Social History Narrative  . No narrative on file     Family History  Problem Relation Age of Onset  . Family history unknown: Yes   Unable to obtain due to intubation/sedation  Review of Systems: Unable to obtain  due to intubation/sedation  Labs:  Lab Results  Component Value Date   WBC 8.7 11/17/2016   HGB 10.5 (L) 11/07/2016   HCT 31.0 (L) 11/08/2016   MCV 85.4 10/30/2016   PLT 156 10/31/2016    Recent Labs Lab 11/12/2016 1732  NA 139  K 3.0*  CL 95*  BUN <3*  CREATININE 3.80*  GLUCOSE 114*   Lab Results  Component Value Date   CHOL 101 11/08/2016   HDL 30 (L) 11/08/2016   LDLCALC 55 11/08/2016   TRIG 81 11/08/2016   No results found for: DDIMER  Radiology/Studies:  Dg Chest Portable 1 View  Result Date: 11/06/2016 CLINICAL DATA:  Endotracheal tube insertion EXAM: PORTABLE CHEST 1 VIEW COMPARISON:  11/08/2016 FINDINGS: The tip of an endotracheal tube is approximately 3.5 cm above the carina. External defibrillator paddle projects over the left hemi thorax. There appears to be gastric tube with tip just below the left hemidiaphragm and sideport 8 cm above the diaphragm. Electronic monitoring device projects over the right hemi thorax. Additional support tubes/lines project over the right upper quadrant of the abdomen and from below, projecting up to the distal SVC. The heart is borderline enlarged. There is mild interstitial edema. There is no pneumothorax. IMPRESSION: Endotracheal tube tip in satisfactory position approximately 3.5 cm above the carina. What appears to be a a gastric tube is noted with side port above the diaphragm by 8 cm. Cardiomegaly with mild interstitial edema. Other support lines and tubes as above. Electronically Signed   By: Ashley Royalty M.D.   On: 10/27/2016 18:10   Dg Chest Portable 1 View  Result Date: 11/08/2016 CLINICAL DATA:  Severe bilateral upper shoulder pain radiating to both sides of the neck. Patient did not complete dialysis today because of the pain. The patient reports feeling week for 4 days with occasional dizziness. EXAM: PORTABLE CHEST 1 VIEW COMPARISON:  PA and lateral chest x-ray of January 07, 2013 FINDINGS: The lungs are mildly  hyperinflated. The interstitial markings are increased bilaterally. The pulmonary vascularity is engorged. The cardiac silhouette is enlarged. There is calcification in the wall of the aortic arch. The bony thorax exhibits no acute abnormality. IMPRESSION: Mild pulmonary interstitial edema consistent with CHF. There is no pneumothorax or significant pleural effusion. Thoracic aortic atherosclerosis. Electronically Signed   By: David  Martinique M.D.   On: 11/08/2016 17:20    Wt Readings from Last 3 Encounters:  11/08/16 169 lb 12.1 oz (77 kg)  09/18/15 157 lb 6.4 oz (71.4 kg)  06/04/15 163 lb 12.8 oz (74.3 kg)    EKG: NSR prolonged PR interval 53ms, prior inferior infarct, no acute ST elevation  Physical Exam: Blood pressure 137/73, pulse 101, temperature 98.4  F (36.9 C), temperature source Rectal, resp. rate 20, SpO2 100 %. There is no height or weight on file to calculate BMI. General:  Chronically ill appearing. Head: Normocephalic, atraumatic, sclera non-icteric, no xanthomas, nares are without discharge.  Neck:  JVD not elevated. Lungs: Diffuse rhonchi. Breathing is unlabored. Heart: RRR with S1 S2, occ ectopy. No murmurs, rubs, or gallops appreciated. Abdomen: Soft, non-tender, non-distended with normoactive bowel sounds. No hepatomegaly. No rebound/guarding. No obvious abdominal masses. Abd scar noted. Msk: Unable to assess strength due to mental status. Extremities: No clubbing or cyanosis. No edema.  Distal pedal pulses are diminshed. Neuro: Obtunded.     Assessment and Plan  77M with ESRD on HD, DM, HTN, gastritis, ? CAD entered in chart but no details available who presented to [facility] as a cardiac arrest.  1. Cardiac arrest with ventricular fibrillation 2. Profound QT prolongation (worse than prior tracings) 3. Frequent ventricular ectopy 4. ESRD on HD with hypokalemia 5. Recent progressive anemia 6. ?questionable history of CAD 7. HTN 8. DM  Pt seen/examined by Dr.  Stanford Breed, I assisted with record gathering. See below for comprehensive recs.  Signed, Charlie Pitter PA-C 10/31/2016, 6:21 PM Pager: [phone] As above, patient seen and examined. Briefly he is a 75 year old male with past medical history of end-stage renal disease dialysis dependent, diabetes mellitus, hypertension, recent anemia for evaluation following cardiac arrest. Patient is intubated and unresponsive and therefore history is obtained through staff and chart. Patient was completing dialysis today. He then became unresponsive and pulseless. He required approximately 15 minutes of CPR. Upon arrival he was noted to be in ventricular fibrillation but no strips are available. He was defibrillated and then required transient pacing. His heart rate is now in the 60s with normal blood pressure. Cardiology is asked to evaluate. Potassium 3.0. Electrocardiogram shows sinus rhythm, RV conduction delay, inferior infarct, markedly prolonged QT interval.  1 status post cardiac arrest-patient arrested following dialysis. His electrocardiogram shows markedly prolonged QT interval. He is also having frequent ectopy. I am concerned that patient had torsades related to his long QT interval. His potassium was 3.0 which could have contributed. We will also discontinue sertraline. We will cycle enzymes although I doubt ischemia mediated event. Check echocardiogram for LV function. Follow QT interval. He will likely require cardiac catheterization if he recovers. Patient is presently being cooled. If he recovers and event is deemed not ischemia mediated and QT remains long he may require subcutaneous defibrillator.  2 VDRF-critical care medicine will be managing.  3 end-stage renal disease-patient will need nephrology consult and dialysis as needed.  Kirk Ruths MD

## 2016-11-22 NOTE — ED Notes (Signed)
Cardiology at bedside.

## 2016-11-22 NOTE — Code Documentation (Signed)
MD at bedside placing central line

## 2016-11-22 NOTE — Progress Notes (Signed)
RT transported patient from ED to 4N without any complications. RT will continue to monitor.

## 2016-11-22 NOTE — ED Notes (Signed)
Portable xray at bedside to verify placement of ETT, OG tube, and central line.

## 2016-11-22 NOTE — ED Notes (Signed)
MD Ray at bedside requesting consult to CCM regarding cooling measures. Ice packs to be placed.

## 2016-11-23 ENCOUNTER — Inpatient Hospital Stay (HOSPITAL_COMMUNITY): Payer: Medicare Other

## 2016-11-23 DIAGNOSIS — I4901 Ventricular fibrillation: Principal | ICD-10-CM

## 2016-11-23 DIAGNOSIS — I469 Cardiac arrest, cause unspecified: Secondary | ICD-10-CM

## 2016-11-23 DIAGNOSIS — I319 Disease of pericardium, unspecified: Secondary | ICD-10-CM

## 2016-11-23 LAB — BLOOD GAS, ARTERIAL
ACID-BASE EXCESS: 0.6 mmol/L (ref 0.0–2.0)
Acid-Base Excess: 0.4 mmol/L (ref 0.0–2.0)
BICARBONATE: 21.9 mmol/L (ref 20.0–28.0)
Bicarbonate: 25.2 mmol/L (ref 20.0–28.0)
DRAWN BY: 398981
FIO2: 50
FIO2: 50
MECHVT: 500 mL
MECHVT: 450 mL
O2 SAT: 97.4 %
O2 Saturation: 98.8 %
PCO2 ART: 44.2 mmHg (ref 32.0–48.0)
PEEP: 5 cmH2O
PEEP: 5 cmH2O
PH ART: 7.374 (ref 7.350–7.450)
PO2 ART: 115 mmHg — AB (ref 83.0–108.0)
Patient temperature: 98.6
Patient temperature: 98.6
RATE: 20 resp/min
RATE: 10 resp/min
pCO2 arterial: 21 mmHg — ABNORMAL LOW (ref 32.0–48.0)
pH, Arterial: 7.621 (ref 7.350–7.450)
pO2, Arterial: 269 mmHg — ABNORMAL HIGH (ref 83.0–108.0)

## 2016-11-23 LAB — CBC
HCT: 25.5 % — ABNORMAL LOW (ref 39.0–52.0)
Hemoglobin: 8.6 g/dL — ABNORMAL LOW (ref 13.0–17.0)
MCH: 27.9 pg (ref 26.0–34.0)
MCHC: 33.7 g/dL (ref 30.0–36.0)
MCV: 82.8 fL (ref 78.0–100.0)
PLATELETS: 114 10*3/uL — AB (ref 150–400)
RBC: 3.08 MIL/uL — AB (ref 4.22–5.81)
RDW: 14.4 % (ref 11.5–15.5)
WBC: 5 10*3/uL (ref 4.0–10.5)

## 2016-11-23 LAB — POCT I-STAT, CHEM 8
BUN: 4 mg/dL — AB (ref 6–20)
BUN: 4 mg/dL — ABNORMAL LOW (ref 6–20)
BUN: 6 mg/dL (ref 6–20)
BUN: 6 mg/dL (ref 6–20)
CALCIUM ION: 0.86 mmol/L — AB (ref 1.15–1.40)
CALCIUM ION: 0.87 mmol/L — AB (ref 1.15–1.40)
CALCIUM ION: 0.97 mmol/L — AB (ref 1.15–1.40)
CALCIUM ION: 1.01 mmol/L — AB (ref 1.15–1.40)
CHLORIDE: 100 mmol/L — AB (ref 101–111)
CREATININE: 4.5 mg/dL — AB (ref 0.61–1.24)
CREATININE: 4.6 mg/dL — AB (ref 0.61–1.24)
Chloride: 102 mmol/L (ref 101–111)
Chloride: 97 mmol/L — ABNORMAL LOW (ref 101–111)
Chloride: 99 mmol/L — ABNORMAL LOW (ref 101–111)
Creatinine, Ser: 3.9 mg/dL — ABNORMAL HIGH (ref 0.61–1.24)
Creatinine, Ser: 4.2 mg/dL — ABNORMAL HIGH (ref 0.61–1.24)
GLUCOSE: 158 mg/dL — AB (ref 65–99)
GLUCOSE: 191 mg/dL — AB (ref 65–99)
GLUCOSE: 113 mg/dL — AB (ref 65–99)
Glucose, Bld: 178 mg/dL — ABNORMAL HIGH (ref 65–99)
HCT: 28 % — ABNORMAL LOW (ref 39.0–52.0)
HCT: 32 % — ABNORMAL LOW (ref 39.0–52.0)
HCT: 34 % — ABNORMAL LOW (ref 39.0–52.0)
HEMATOCRIT: 27 % — AB (ref 39.0–52.0)
HEMOGLOBIN: 10.9 g/dL — AB (ref 13.0–17.0)
HEMOGLOBIN: 9.2 g/dL — AB (ref 13.0–17.0)
HEMOGLOBIN: 9.5 g/dL — AB (ref 13.0–17.0)
Hemoglobin: 11.6 g/dL — ABNORMAL LOW (ref 13.0–17.0)
POTASSIUM: 4.1 mmol/L (ref 3.5–5.1)
POTASSIUM: 3.1 mmol/L — AB (ref 3.5–5.1)
Potassium: 2.6 mmol/L — CL (ref 3.5–5.1)
Potassium: 2.8 mmol/L — ABNORMAL LOW (ref 3.5–5.1)
SODIUM: 139 mmol/L (ref 135–145)
Sodium: 143 mmol/L (ref 135–145)
Sodium: 139 mmol/L (ref 135–145)
Sodium: 140 mmol/L (ref 135–145)
TCO2: 21 mmol/L (ref 0–100)
TCO2: 24 mmol/L (ref 0–100)
TCO2: 24 mmol/L (ref 0–100)
TCO2: 24 mmol/L (ref 0–100)

## 2016-11-23 LAB — MAGNESIUM: MAGNESIUM: 1.8 mg/dL (ref 1.7–2.4)

## 2016-11-23 LAB — BASIC METABOLIC PANEL
ANION GAP: 13 (ref 5–15)
ANION GAP: 10 (ref 5–15)
BUN: 6 mg/dL (ref 6–20)
BUN: 6 mg/dL (ref 6–20)
CALCIUM: 7.7 mg/dL — AB (ref 8.9–10.3)
CHLORIDE: 104 mmol/L (ref 101–111)
CO2: 21 mmol/L — AB (ref 22–32)
CO2: 23 mmol/L (ref 22–32)
CREATININE: 4.38 mg/dL — AB (ref 0.61–1.24)
Calcium: 7.5 mg/dL — ABNORMAL LOW (ref 8.9–10.3)
Chloride: 102 mmol/L (ref 101–111)
Creatinine, Ser: 4.62 mg/dL — ABNORMAL HIGH (ref 0.61–1.24)
GFR calc Af Amer: 13 mL/min — ABNORMAL LOW (ref >60)
GFR calc non Af Amer: 12 mL/min — ABNORMAL LOW (ref >60)
GFR, EST AFRICAN AMERICAN: 14 mL/min — AB (ref >60)
GFR, EST NON AFRICAN AMERICAN: 11 mL/min — AB (ref >60)
GLUCOSE: 48 mg/dL — AB (ref 65–99)
Glucose, Bld: 165 mg/dL — ABNORMAL HIGH (ref 65–99)
POTASSIUM: 3.4 mmol/L — AB (ref 3.5–5.1)
Potassium: 3.3 mmol/L — ABNORMAL LOW (ref 3.5–5.1)
SODIUM: 136 mmol/L (ref 135–145)
Sodium: 137 mmol/L (ref 135–145)

## 2016-11-23 LAB — APTT: aPTT: 41 seconds — ABNORMAL HIGH (ref 24–36)

## 2016-11-23 LAB — GLUCOSE, CAPILLARY
GLUCOSE-CAPILLARY: 154 mg/dL — AB (ref 65–99)
GLUCOSE-CAPILLARY: 170 mg/dL — AB (ref 65–99)
GLUCOSE-CAPILLARY: 175 mg/dL — AB (ref 65–99)
GLUCOSE-CAPILLARY: 175 mg/dL — AB (ref 65–99)
GLUCOSE-CAPILLARY: 178 mg/dL — AB (ref 65–99)
GLUCOSE-CAPILLARY: 182 mg/dL — AB (ref 65–99)
GLUCOSE-CAPILLARY: 202 mg/dL — AB (ref 65–99)
GLUCOSE-CAPILLARY: 48 mg/dL — AB (ref 65–99)
GLUCOSE-CAPILLARY: 78 mg/dL (ref 65–99)
Glucose-Capillary: 159 mg/dL — ABNORMAL HIGH (ref 65–99)
Glucose-Capillary: 190 mg/dL — ABNORMAL HIGH (ref 65–99)
Glucose-Capillary: 147 mg/dL — ABNORMAL HIGH (ref 65–99)
Glucose-Capillary: 115 mg/dL — ABNORMAL HIGH (ref 65–99)
Glucose-Capillary: 190 mg/dL — ABNORMAL HIGH (ref 65–99)

## 2016-11-23 LAB — PROTIME-INR
INR: 1.47
PROTHROMBIN TIME: 18 s — AB (ref 11.4–15.2)

## 2016-11-23 LAB — TROPONIN I
TROPONIN I: 0.7 ng/mL — AB (ref <0.03)
Troponin I: 1.04 ng/mL (ref <0.03)

## 2016-11-23 LAB — PHOSPHORUS: Phosphorus: 1.3 mg/dL — ABNORMAL LOW (ref 2.5–4.6)

## 2016-11-23 MED ORDER — SODIUM PHOSPHATES 45 MMOLE/15ML IV SOLN
10.0000 mmol | Freq: Once | INTRAVENOUS | Status: AC
  Administered 2016-11-23: 10 mmol via INTRAVENOUS
  Filled 2016-11-23: qty 3.33

## 2016-11-23 MED ORDER — ASPIRIN 325 MG PO TABS
325.0000 mg | ORAL_TABLET | Freq: Every day | ORAL | Status: DC
  Administered 2016-11-23 – 2016-12-02 (×10): 325 mg via NASOGASTRIC
  Filled 2016-11-23 (×10): qty 1

## 2016-11-23 MED ORDER — SODIUM CHLORIDE 0.9 % IV SOLN
1.0000 g | Freq: Once | INTRAVENOUS | Status: AC
  Administered 2016-11-23: 1 g via INTRAVENOUS
  Filled 2016-11-23: qty 10

## 2016-11-23 MED ORDER — SODIUM CHLORIDE 0.9% FLUSH
10.0000 mL | Freq: Two times a day (BID) | INTRAVENOUS | Status: DC
Start: 2016-11-23 — End: 2016-12-03
  Administered 2016-11-23 – 2016-11-27 (×8): 10 mL
  Administered 2016-11-28: 30 mL
  Administered 2016-11-28: 10 mL
  Administered 2016-11-29: 30 mL
  Administered 2016-11-29 – 2016-12-02 (×5): 10 mL

## 2016-11-23 MED ORDER — ORAL CARE MOUTH RINSE
15.0000 mL | OROMUCOSAL | Status: DC
  Administered 2016-11-23 – 2016-12-02 (×85): 15 mL via OROMUCOSAL

## 2016-11-23 MED ORDER — SODIUM CHLORIDE 0.9% FLUSH: 10.0000 mL | INTRAVENOUS | Status: DC | PRN

## 2016-11-23 MED ORDER — POTASSIUM CHLORIDE 20 MEQ PO PACK
40.0000 meq | PACK | Freq: Two times a day (BID) | ORAL | Status: DC
  Administered 2016-11-23: 40 meq via ORAL
  Filled 2016-11-23 (×2): qty 2

## 2016-11-23 MED ORDER — CHLORHEXIDINE GLUCONATE 0.12% ORAL RINSE (MEDLINE KIT)
15.0000 mL | Freq: Two times a day (BID) | OROMUCOSAL | Status: DC
  Administered 2016-11-23 – 2016-12-02 (×18): 15 mL via OROMUCOSAL

## 2016-11-23 MED ORDER — SODIUM CHLORIDE 0.9 % IV SOLN
30.0000 meq | Freq: Once | INTRAVENOUS | Status: AC
  Administered 2016-11-23: 30 meq via INTRAVENOUS
  Filled 2016-11-23: qty 15

## 2016-11-23 MED ORDER — INSULIN ASPART 100 UNIT/ML ~~LOC~~ SOLN
0.0000 [IU] | SUBCUTANEOUS | Status: DC
  Administered 2016-11-23: 5 [IU] via SUBCUTANEOUS

## 2016-11-23 MED ORDER — PERFLUTREN LIPID MICROSPHERE
INTRAVENOUS | Status: AC
  Administered 2016-11-23: 2 mL
  Filled 2016-11-23: qty 10

## 2016-11-23 MED ORDER — DARBEPOETIN ALFA 60 MCG/0.3ML IJ SOSY: 60.0000 ug | PREFILLED_SYRINGE | INTRAMUSCULAR | Status: DC

## 2016-11-23 MED ORDER — CALCITRIOL 1 MCG/ML IV SOLN
1.2500 ug | INTRAVENOUS | Status: DC
  Administered 2016-11-29 – 2016-12-01 (×2): 1.3 ug via INTRAVENOUS
  Filled 2016-11-23 (×2): qty 2

## 2016-11-23 MED ORDER — NOREPINEPHRINE BITARTRATE 1 MG/ML IV SOLN
0.0000 ug/min | INTRAVENOUS | Status: DC
  Administered 2016-11-23: 30 ug/min via INTRAVENOUS
  Administered 2016-11-23: 24 ug/min via INTRAVENOUS
  Administered 2016-11-24: 20 ug/min via INTRAVENOUS
  Administered 2016-11-25: 18 ug/min via INTRAVENOUS
  Filled 2016-11-23 (×6): qty 16

## 2016-11-23 MED ORDER — DEXTROSE 50 % IV SOLN
50.0000 mL | Freq: Once | INTRAVENOUS | Status: AC
  Administered 2016-11-23: 50 mL via INTRAVENOUS
  Filled 2016-11-23: qty 50

## 2016-11-23 MED ORDER — INSULIN ASPART 100 UNIT/ML ~~LOC~~ SOLN: 0.0000 [IU] | SUBCUTANEOUS | Status: DC

## 2016-11-23 NOTE — Progress Notes (Signed)
CRITICAL VALUE ALERT  Critical value received:  Potassium of 2.6 & QTC of 610/643ms  Date of notification:  11/23/16  Time of notification:  0808  MD notified: Elsworth Soho  Time of first page: 0810  Responding MD:  Elsworth Soho, Potassium supplement ordered. No orders at this time for qtc interval.   Time MD responded:  [id number]

## 2016-11-23 NOTE — Progress Notes (Signed)
PULMONARY / CRITICAL CARE MEDICINE   Name: Ronald Brewer MRN: [id number] DOB: Oct 17, 1941    ADMISSION DATE:  11/05/2016 CONSULTATION DATE:  11/23/2016   CHIEF COMPLAINT:  Cardiac arrest  HISTORY OF PRESENT ILLNESS:   75 year old man with ESRD on hemodialysis and CAD was brought in by EMS after a witnessed cardiac arrest at dialysis 11/27. He finished his treatment, then had a syncopal episode and vomited, again syncope with loss of pulse-shock advised the AED. After EMS arrival, noted to be in V. fib given epinephrine 1 with ROSC, total CPR time about 15 minutes. He is a diabetic hypertensive neck: 2011 shows normal ejection fraction He was admitted 10/2016 for generalized weakness, black stools and hemoglobin about 9 attributed to anemia of chronic disease He remained unresponsive after arrival to the emergency room had nonpurposeful movement of both upper extremities EKG showed prolonged QT, frequent PVCs   SUBJECTIVE:  Sedated, being cooled, at goal temp On levophed gtt  VITAL SIGNS: BP 121/64 (BP Location: Right Arm)   Pulse 65   Temp (!) 91 F (32.8 C) (Core (Comment))   Resp 10   Ht 5\' 10"  (1.778 m)   Wt 172 lb 9.9 oz (78.3 kg)   SpO2 100%   BMI 24.77 kg/m   HEMODYNAMICS: CVP:  [8 mmHg-17 mmHg] 8 mmHg  VENTILATOR SETTINGS: Vent Mode: PRVC FiO2 (%):  [40 %-100 %] 40 % Set Rate:  [10 bmp-20 bmp] 10 bmp Vt Set:  [450 mL-500 mL] 450 mL PEEP:  [5 cmH20] 5 cmH20 Plateau Pressure:  [18 cmH20-22 cmH20] 19 cmH20  INTAKE / OUTPUT: I/O last 3 completed shifts: In: 1440.6 [I.V.:1228.6; NG/GT:60; IV Piggyback:152] Out: 300 [Emesis/NG output:300]  PHYSICAL EXAMINATION:   General:  chronically ill-appearing elderly  intubated ,  Neuro:  Unresponsive, GCS 3 HEENT:  Oral ET tube, pupils 3 mm bilaterally equally reactive to light  S1-S2 normal, no rub, no murmur Decreased breath sounds bilateral, no rhonchi   soft and nontender abdomen Extremities-left AV fistula with  thrill, right femoral CVL   LABS:  BMET  Recent Labs Lab 11/07/2016 1722  11/23/16 0007 11/23/16 0400 11/23/16 0806  NA 136  < > 139 136 143  K 3.0*  < > 2.8* 3.3* 2.6*  CL 97*  < > 97* 102 102  CO2 25  --   --  21*  --   BUN <5*  < > 4* 6 4*  CREATININE 3.86*  < > 4.20* 4.38* 3.90*  GLUCOSE 114*  < > 178* 165* 158*  < > = values in this interval not displayed.  Electrolytes  Recent Labs Lab 11/17/2016 1722 11/13/2016 2040 11/23/16 0400  CALCIUM 7.4*  --  7.7*  MG  --  1.9 1.8  PHOS  --   --  1.3*    CBC  Recent Labs Lab 11/21/2016 1722  11/23/16 0007 11/23/16 0400 11/23/16 0806  WBC 8.7  --   --  5.0  --   HGB 8.7*  < > 9.2* 8.6* 9.5*  HCT 26.3*  < > 27.0* 25.5* 28.0*  PLT 156  --   --  114*  --   < > = values in this interval not displayed.  Coag's  Recent Labs Lab 11/10/2016 1722 11/23/16 0205  APTT 40* 41*  INR 1.35 1.47    Sepsis Markers  Recent Labs Lab 11/04/2016 1732  LATICACIDVEN 6.09*    ABG  Recent Labs Lab 11/13/2016 1723 11/23/16 0338 11/23/16 0646  PHART  7.423 7.621* 7.374  PCO2ART 47.3 21.0* 44.2  PO2ART 352.0* 269* 115*    Liver Enzymes No results for input(s): AST, ALT, ALKPHOS, BILITOT, ALBUMIN in the last 168 hours.  Cardiac Enzymes  Recent Labs Lab 11/04/2016 2040 11/23/16 0100 11/23/16 0605  TROPONINI 0.23* 0.70* 1.04*    Glucose  Recent Labs Lab 11/23/16 0203 11/23/16 0303 11/23/16 0404 11/23/16 0605 11/23/16 0755 11/23/16 0956  GLUCAP 178* 170* 159* 154* 182* 202*    Imaging Dg Chest Portable 1 View  Result Date: 11/12/2016 CLINICAL DATA:  Endotracheal tube insertion EXAM: PORTABLE CHEST 1 VIEW COMPARISON:  11/08/2016 FINDINGS: The tip of an endotracheal tube is approximately 3.5 cm above the carina. External defibrillator paddle projects over the left hemi thorax. There appears to be gastric tube with tip just below the left hemidiaphragm and sideport 8 cm above the diaphragm. Electronic monitoring  device projects over the right hemi thorax. Additional support tubes/lines project over the right upper quadrant of the abdomen and from below, projecting up to the distal SVC. The heart is borderline enlarged. There is mild interstitial edema. There is no pneumothorax. IMPRESSION: Endotracheal tube tip in satisfactory position approximately 3.5 cm above the carina. What appears to be a a gastric tube is noted with side port above the diaphragm by 8 cm. Cardiomegaly with mild interstitial edema. Other support lines and tubes as above. Electronically Signed   By: Ashley Royalty M.D.   On: 11/11/2016 18:10   Dg Abd Portable 1v  Result Date: 11/25/2016 CLINICAL DATA:  Orogastric tube placement EXAM: PORTABLE ABDOMEN - 1 VIEW COMPARISON:  10/29/2016 at 1800 hours FINDINGS: The tip and side port of the gastric tube project to be the left hemidiaphragm in the expected location of the gastric cardia and proximal body of the stomach. There is no free air nor bowel obstruction. No acute osseous abnormality. Right femoral line projects over the right sacral ala as before. External defibrillator paddle noted over the left lung base. IMPRESSION: Gastric tube tip and side port in stomach. Stable position of right femoral line. Electronically Signed   By: Ashley Royalty M.D.   On: 11/15/2016 21:55   Dg Abd Portable 1 View  Result Date: 11/07/2016 CLINICAL DATA:  Right femoral line insertion. EXAM: PORTABLE ABDOMEN - 1 VIEW COMPARISON:  None. FINDINGS: Single portable view of the abdomen and pelvis. This demonstrates right femoral line projecting over the upper sacrum, in the region of the common iliac vessels. Borderline gastric distension. Otherwise unremarkable bowel gas pattern. Vascular calcifications. IMPRESSION: Right femoral line projecting with tip in the expected location of the common iliac vessels. Electronically Signed   By: Abigail Miyamoto M.D.   On: 11/02/2016 18:19     STUDIES:  Echo  11/28>>  CULTURES:   ANTIBIOTICS:   SIGNIFICANT EVENTS: 11/27 admit  LINES/TUBES: 11/27 rt fem CVL >> 11/27 ETT >>  DISCUSSION:  Cardiac arrest due to ventricular fibrillation in the setting of prolonged QT this may be due to postdialysis shift electrolytes , medication such as Zoloft, intrinsic cardiac disease, acute ischemia will need to be ruled out   PULMONARY A:  Acute respiratory failure P:   Prvc, vent settings were reviewed and adjusted   keep respiratory rate at 10  CARDIOVASCULAR A:   ventricular fibrillation Prolonged QT -610 11/28 P:  Cardiology following Follow QT Replete electrolytes Cycle troponins -to capture peak Check echo May need defibrillator if no evidence of acute ischemia  RENAL A:    hypokalemia  hypophos ESRD P:   Follow bmet every 4 hours and replete as needed   GI A:    no issues P:   Npo protonix   Heme A:    anemia of chronic disease P:  Follow  ID A:    no issues P:   Concern for aspiration but hold off antibiotics for now   EndoCRINE A:   DM-2   P:   SSI -mod   NEUROLOGIC A:   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 Critical Care Time devoted to patient care services described in this note independent of APP time is 35 minutes.    Kara Mead MD. Shade Flood. [facility] Pager [phone] If no response call [phone]    11/23/2016, 10:03  AM

## 2016-11-23 NOTE — Procedures (Signed)
ELECTROENCEPHALOGRAM REPORT  Date of Study: 11/23/2016  Patient's Name: Ronald Brewer MRN: [id number] Date of Birth: [birth year]  Referring Provider: Kara Mead, MD  Clinical History: 75 year old man with ESRD on hemodialysis and CAD was brought in by EMS after a witnessed cardiac arrest at dialysis 11/27  Medications: Hospital Medications  0.9 % sodium chloride infusion   artificial tears (LACRILUBE) ophthalmic ointment 1 application   aspirin tablet 325 mg   chlorhexidine gluconate (MEDLINE KIT) (PERIDEX) 0.12 % solution 15 mL  L1 cisatracurium (NIMBEX) 200 mg in sodium chloride 0.9 % 200 mL (1 mg/mL) infusion  L1 cisatracurium (NIMBEX) bolus via infusion 3.9 mg   fentaNYL (SUBLIMAZE) bolus via infusion 25 mcg   fentaNYL 2540mg in NS 2574m(1012mml) infusion-PREMIX   heparin injection 5,000 Units   MEDLINE mouth rinse   midazolam (VERSED) 50 mg in sodium chloride 0.9 % 50 mL (1 mg/mL) infusion   midazolam (VERSED) bolus via infusion 1 mg   norepinephrine (LEVOPHED) 4 mg in dextrose 5 % 250 mL (0.016 mg/mL) infusion   pantoprazole sodium (PROTONIX) 40 mg/20 mL oral suspension 40 mg   potassium chloride (KLOR-CON) packet 40 mEq   potassium chloride 30 mEq in sodium chloride 0.9 % 265 mL (KCL MULTIRUN) IVPB   sodium phosphate 10 mmol in dextrose 5 % 250 mL infusion   Technical Summary: A multichannel digital EEG recording measured by the international 10-20 system with electrodes applied with paste and impedances below 5000 ohms performed in our laboratory with EKG monitoring in an intubated and sedated patient undergoing hypothermia protocol.  Hyperventilation and photic stimulation were not performed.  The digital EEG was referentially recorded, reformatted, and digitally filtered in a variety of bipolar and referential montages for optimal display.    Description: The patient is intubated and sedated during the recording. There is loss of normal background activity. The records  read at a sensitivity of 3 uV/mm shows diffuse suppression of background activity. There is no spontaneous reactivity or reactivity noted with noxious stimulation. There is muscle artifact over the frontal leads. Hyperventilation and photic stimulation were not performed. There were no epileptiform discharges or electrographic seizures seen.   EKG lead was unremarkable.  Impression: This EEG is markedly abnormal due to diffuse background suppression and lack of EEG reactivity with noxious stimulation.  Clinical Correlation of the above findings indicates severe diffuse cerebral dysfunction that is non-specific in etiology and can be seen in the setting of anoxic/ischemic injury, toxic/metabolic encephalopathies, or medication effect. In the absence of CNS active, sedating, or anesthetic medications, this suggests a poor prognosis. Clinical correlation is advised.  AdaMetta ClinesO

## 2016-11-23 NOTE — Consult Note (Signed)
[facility] Renal Consultation Note    Indication for Consultation:  Management of ESRD/hemodialysis; anemia, hypertension/volume and secondary hyperparathyroidism PCP:  HPI: Ronald Brewer is a 75 y.o. male with ESRD, DM, HTN who was noted after his dialysis treatment Monday to be not responding. The RN when over to see him, shook him and he began talking. He initial said he was fine, but then start vomiting. After a few minutes he told the RN he was fine.  About 3 minutes later, the tech said he was unresponsive again. He was not breathing and did not have a pulse. EMS was called, AED was applied and they initiated respirations. EMS then applied their own and 1 shock was initiated, then CPR resumed.  The AED did not indicate the need for more compression. Pt was intubated and transported to [facility]. He was in Vfib upon arrival, one dose of epi was given with the return of ROSC, total CPR time about 15 min. Marland Kitchen He was admitted by critical care  Past Medical History:  Diagnosis Date  . Anemia   . Coronary artery disease   . Cough   . Diabetes mellitus    25 years ago  . Dizziness   . End stage renal disease ([place])    esrd  . Gastritis   . Hypertension   . Insomnia   . Myocardial infarction   . Occult blood in stools   . Shortness of breath   . Weakness generalized    Past Surgical History:  Procedure Laterality Date  . COLONOSCOPY  07/27/2012   Procedure: COLONOSCOPY;  Surgeon: Winfield Cunas., MD;  Location: [facility];  Service: Endoscopy;  Laterality: N/A;  . ESOPHAGOGASTRODUODENOSCOPY  07/27/2012   Procedure: ESOPHAGOGASTRODUODENOSCOPY (EGD);  Surgeon: Winfield Cunas., MD;  Location: [facility];  Service: Endoscopy;  Laterality: N/A;   Family History  Problem Relation Age of Onset  . Family history unknown: Yes   Social History:  reports that he quit smoking about 50 years ago. He has quit using smokeless tobacco. He reports that he does not drink alcohol or  use drugs. Allergies  Allergen Reactions  . Lisinopril Other (See Comments)    Noted to be allergic on paperwork from diaylsis  . Tape Itching and Rash    Please use "paper" tape only.   Prior to Admission medications   Medication Sig Start Date End Date Taking? Authorizing Provider  acetaminophen (TYLENOL) 500 MG tablet Take 500 mg by mouth every 6 (six) hours as needed for moderate pain or headache.   Yes Historical Provider, MD  aspirin EC 81 MG tablet Take 81 mg by mouth daily.   Yes Historical Provider, MD  OVER THE COUNTER MEDICATION Apply 1 application topically as needed (for itching). OTC Itching Cream   Yes Historical Provider, MD   Current Facility-Administered Medications  Medication Dose Route Frequency Provider Last Rate Last Dose  . 0.9 %  sodium chloride infusion   Intravenous Continuous Rigoberto Noel, MD 10 mL/hr at 11/23/16 0947    . artificial tears (LACRILUBE) ophthalmic ointment 1 application  1 application Both Eyes O6V Rigoberto Noel, MD   1 application at 67/20/94 1351  . aspirin tablet 325 mg  325 mg Per NG tube Daily Lelon Perla, MD   325 mg at 11/23/16 0851  . chlorhexidine gluconate (MEDLINE KIT) (PERIDEX) 0.12 % solution 15 mL  15 mL Mouth Rinse BID Rigoberto Noel, MD   15 mL  at 11/23/16 0814  . cisatracurium (NIMBEX) 200 mg in sodium chloride 0.9 % 200 mL (1 mg/mL) infusion  1-1.5 mcg/kg/min Intravenous Continuous Rigoberto Noel, MD 4.6 mL/hr at 11/07/2016 1955 1 mcg/kg/min at 11/02/2016 1955   And  . cisatracurium (NIMBEX) bolus via infusion 3.9 mg  0.05 mg/kg Intravenous PRN Rigoberto Noel, MD      . fentaNYL (SUBLIMAZE) bolus via infusion 25 mcg  25 mcg Intravenous Q30 min PRN Rigoberto Noel, MD      . fentaNYL 2530mg in NS 2528m(1076mml) infusion-PREMIX  100-300 mcg/hr Intravenous Continuous RakRigoberto NoelD 15 mL/hr at 11/23/16 1056 150 mcg/hr at 11/23/16 1056  . heparin injection 5,000 Units  5,000 Units Subcutaneous Q8H RakRigoberto NoelD   5,000 Units at  11/23/16 1429  . insulin aspart (novoLOG) injection 0-15 Units  0-15 Units Subcutaneous Q4H RakRigoberto NoelD   5 Units at 11/23/16 1012  . MEDLINE mouth rinse  15 mL Mouth Rinse 10 times per day RakRigoberto NoelD   15 mL at 11/23/16 1352  . midazolam (VERSED) 50 mg in sodium chloride 0.9 % 50 mL (1 mg/mL) infusion  2-10 mg/hr Intravenous Continuous RakRigoberto NoelD 2 mL/hr at 11/23/16 0840 2 mg/hr at 11/23/16 0840  . midazolam (VERSED) bolus via infusion 1 mg  1 mg Intravenous Q30 min PRN RakRigoberto NoelD      . norepinephrine (LEVOPHED) 16 mg in dextrose 5 % 250 mL (0.064 mg/mL) infusion  0-50 mcg/min Intravenous Titrated RakRigoberto NoelD 24.4 mL/hr at 11/23/16 1500 26 mcg/min at 11/23/16 1500  . pantoprazole sodium (PROTONIX) 40 mg/20 mL oral suspension 40 mg  40 mg Per Tube Daily RakRigoberto NoelD   40 mg at 11/23/16 0851  . potassium chloride (KLOR-CON) packet 40 mEq  40 mEq Oral BID RakRigoberto NoelD   40 mEq at 11/23/16 0851   Labs: Basic Metabolic Panel:  Recent Labs Lab 11/03/2016 1722  11/23/16 0400 11/23/16 0806 11/23/16 1201  NA 136  < > 136 143 139  K 3.0*  < > 3.3* 2.6* 4.1  CL 97*  < > 102 102 99*  CO2 25  --  21*  --   --   GLUCOSE 114*  < > 165* 158* 191*  BUN <5*  < > 6 4* 6  CREATININE 3.86*  < > 4.38* 3.90* 4.50*  CALCIUM 7.4*  --  7.7*  --   --   PHOS  --   --  1.3*  --   --   < > = values in this interval not displayed. Liver Function Tests: No results for input(s): AST, ALT, ALKPHOS, BILITOT, PROT, ALBUMIN in the last 168 hours. No results for input(s): LIPASE, AMYLASE in the last 168 hours. No results for input(s): AMMONIA in the last 168 hours. CBC:  Recent Labs Lab 11/05/2016 1722  11/23/16 0400 11/23/16 0806 11/23/16 1201  WBC 8.7  --  5.0  --   --   HGB 8.7*  < > 8.6* 9.5* 10.9*  HCT 26.3*  < > 25.5* 28.0* 32.0*  MCV 85.4  --  82.8  --   --   PLT 156  --  114*  --   --   < > = values in this interval not displayed. Cardiac Enzymes:  Recent  Labs Lab 11/15/2016 2040 11/23/16 0100 11/23/16 0605  TROPONINI 0.23* 0.70* 1.04*   CBG:  Recent  Labs Lab 11/23/16 0605 11/23/16 0755 11/23/16 0956 11/23/16 1159 11/23/16 1359  GLUCAP 154* 182* 202* 190* 147*   Iron Studies: No results for input(s): IRON, TIBC, TRANSFERRIN, FERRITIN in the last 72 hours. Studies/Results: Dg Chest Portable 1 View  Result Date: 11/09/2016 CLINICAL DATA:  Endotracheal tube insertion EXAM: PORTABLE CHEST 1 VIEW COMPARISON:  11/08/2016 FINDINGS: The tip of an endotracheal tube is approximately 3.5 cm above the carina. External defibrillator paddle projects over the left hemi thorax. There appears to be gastric tube with tip just below the left hemidiaphragm and sideport 8 cm above the diaphragm. Electronic monitoring device projects over the right hemi thorax. Additional support tubes/lines project over the right upper quadrant of the abdomen and from below, projecting up to the distal SVC. The heart is borderline enlarged. There is mild interstitial edema. There is no pneumothorax. IMPRESSION: Endotracheal tube tip in satisfactory position approximately 3.5 cm above the carina. What appears to be a a gastric tube is noted with side port above the diaphragm by 8 cm. Cardiomegaly with mild interstitial edema. Other support lines and tubes as above. Electronically Signed   By: Ashley Royalty M.D.   On: 11/05/2016 18:10   Dg Abd Portable 1v  Result Date: 11/04/2016 CLINICAL DATA:  Orogastric tube placement EXAM: PORTABLE ABDOMEN - 1 VIEW COMPARISON:  11/21/2016 at 1800 hours FINDINGS: The tip and side port of the gastric tube project to be the left hemidiaphragm in the expected location of the gastric cardia and proximal body of the stomach. There is no free air nor bowel obstruction. No acute osseous abnormality. Right femoral line projects over the right sacral ala as before. External defibrillator paddle noted over the left lung base. IMPRESSION: Gastric tube tip  and side port in stomach. Stable position of right femoral line. Electronically Signed   By: Ashley Royalty M.D.   On: 10/31/2016 21:55   Dg Abd Portable 1 View  Result Date: 11/02/2016 CLINICAL DATA:  Right femoral line insertion. EXAM: PORTABLE ABDOMEN - 1 VIEW COMPARISON:  None. FINDINGS: Single portable view of the abdomen and pelvis. This demonstrates right femoral line projecting over the upper sacrum, in the region of the common iliac vessels. Borderline gastric distension. Otherwise unremarkable bowel gas pattern. Vascular calcifications. IMPRESSION: Right femoral line projecting with tip in the expected location of the common iliac vessels. Electronically Signed   By: Abigail Miyamoto M.D.   On: 11/25/2016 18:19    ROS: Unable to obtain due to sedation.  Physical Exam: Vitals:   11/23/16 1440 11/23/16 1500 11/23/16 1528 11/23/16 1539  BP:   133/67   Pulse: 91 82 77 74  Resp: _0 Temp:  (!) 92.8 F (33.8 C)    TempSrc:  Core (Comment)    SpO2: 90% 94% 94% 97%  Weight:      Height:         General: sedated AAM appears comfortable HEENT: intubated/oral ET tub Lungs:  Diff to hear due to cooling pads on chest. Heart: RRR  Abdomen:  soft dim BS, right fem central line Lower extremities:without edema or ischemic changes, no open wounds  Neuro:  unresponsive Dialysis Access: left upper AVF + bruit  Dialysis Orders: MWF [facility] 3.75 hours 500/800 EDW 77 2 K 2 Ca left upper AVF heparin 2000 Mircera 75 q 2 weeks-lst 11/15 calcitriol 1.25 - iPTH 190 - Ca usually 9s and P in goal  Assessment/Plan: 1. s/p cardiac arrest- V fib - cards  following; cooling protocol in progress.  EEG done Neuro following - markedly abnormal 2. Acute resp failure - net UF 0.8 Monday with post weight 77.2 - gets below EDW at times. 3. ESRD with hypokalemia - post HD yesterday - use 4 K bath -  MWF - plan HD tomorrow- K supplements given IV - will use  4 K bath with HD.lower machine temp to 35 -; reduce BFR  to 400 to limit stress - he has had adequate kinetics at lower flows 4. Hypertension/volume  - on Levophed for BP support - usual BP run 150 - 170 pre HD with small gains 5. Anemia  - hgb 8.7 post HD up to 10.9 without transfusion - last outpt Hgb was 10.6 11/15 with 31% sat; redose next tmt at 60 - re-eval hgb with labs tomorrow 6. Metabolic bone disease -  Continue calcitriol - covert to IV 7. Nutrition - feedings not initiated yet  Myriam Jacobson, PA-C [place] [phone] 11/23/2016, 3:41 PM   Pt seen, examined and agree w A/P as above. Will plan regular HD tomorrow if pt will tolerate, if not will need CRRT.   Kelly Splinter MD [employer] pager [phone]   11/23/2016, 4:33 PM

## 2016-11-23 NOTE — Progress Notes (Signed)
eLink Physician-Brief Progress Note Patient Name: LINCOLN CHRISTIANSEN DOB: 1941/10/02 MRN: [id number]   Date of Service  11/23/2016  HPI/Events of Note  Phos supp conservative  eICU Interventions       Intervention Category Major Interventions: Electrolyte abnormality - evaluation and management  Raylene Miyamoto. 11/23/2016, 5:13 AM

## 2016-11-23 NOTE — Progress Notes (Signed)
eLink Physician-Brief Progress Note Patient Name: Ronald Brewer DOB: 01/18/1941 MRN: [id number]   Date of Service  11/23/2016  HPI/Events of Note  K=3.1; ESRD.   eICU Interventions  Will monitor, repeat K in 6 hours.      Intervention Category Minor Interventions: Electrolytes abnormality - evaluation and management  Laverle Hobby 11/23/2016, 5:03 PM

## 2016-11-23 NOTE — Progress Notes (Signed)
Spoke with patients spouse via phone this am, gave an update on patients current status. Per spouse she states she will be coming to visit today after catching the bus. Advised spouse about our visitation hours and policy.

## 2016-11-23 NOTE — Progress Notes (Signed)
   11/23/16 0500  Clinical Encounter Type  Visited With Family  Visit Type ED;Critical Care  Spiritual Encounters  Spiritual Needs Emotional  CH met family in ED and escorted to 4N for pt transfer; Family (Wife) quite distraught and complaining and is CNA & Rev., she did not allow MD to brief on update in ED; Gwynn Burly

## 2016-11-23 NOTE — Progress Notes (Signed)
EKG CRITICAL VALUE     12 lead EKG performed.  Critical value noted.  Jacqlyn Larsen, RN notified.   Yehuda Mao, CCT 11/23/2016 8:10 AM

## 2016-11-23 NOTE — Progress Notes (Signed)
Patient K+ 3.4. Per rewarming protocol, D/C all K+ supplements. Warren Lacy, MD called and notified. Verbal orders to not replace K+ given and it is okay to D/C supplements.

## 2016-11-23 NOTE — Progress Notes (Signed)
CRITICAL VALUE ALERT  Critical value received:  Potassium 3.1  Date of notification:  11/23/16  Time of notification:  40  MD notified (1st page):  MD Ashby Dawes  Time of first page:  1610  Responding MD:  MD Ashby Dawes, no new medications at this time. Recheck potassium at 2000  Time MD responded:  [id number]

## 2016-11-23 NOTE — Progress Notes (Signed)
Hypoglycemic Event  CBG: 48  Treatment: D50 IV 50 mL  Symptoms: None  Follow-up CBG: Time: 2033 CBG Result: 190  Possible Reasons for Event: Inadequate meal intake  Comments/MD notified: MD not notified. Hypoglycemic Protocol used and effective. Will continue to monitor patient     Ronald Brewer

## 2016-11-23 NOTE — Progress Notes (Signed)
eLink Physician-Brief Progress Note Patient Name: Ronald Brewer DOB: 1941/02/19 MRN: [id number]   Date of Service  11/23/2016  HPI/Events of Note  Ion c low  eICU Interventions  Calcium glu supp     Intervention Category Intermediate Interventions: Electrolyte abnormality - evaluation and management  Raylene Miyamoto. 11/23/2016, 12:37 AM

## 2016-11-23 NOTE — Progress Notes (Deleted)
EEG maint complete. No skin breakdown. Added more pasted to frontal leads.

## 2016-11-23 NOTE — Progress Notes (Signed)
Spoke to spouse via phone. Spouse did not catch the bus in time to make it to the hospital today. She would like to have a MD call to giver her an update tomorrow morning. Education given to spouse on patients current condition. Will continue to monitor.

## 2016-11-23 NOTE — Progress Notes (Signed)
  Echocardiogram 2D Echocardiogram with Definity has been performed.  Jimmie Rueter 11/23/2016, 10:13 AM

## 2016-11-23 NOTE — Progress Notes (Signed)
EEG completed, results pending. 

## 2016-11-23 NOTE — Progress Notes (Addendum)
Patient Name: Ronald Brewer Date of Encounter: 11/23/2016  Primary Cardiologist: Dr [facility] Problem List     Active Problems:   ESRD on dialysis [facility])   Anemia   Hypokalemia   Cardiac arrest with ventricular fibrillation (HCC)   Prolonged QT interval   Cardiac arrest (HCC)     Subjective   Intubated and sedated  Inpatient Medications    Scheduled Meds: . artificial tears  1 application Both Eyes Z5G  . chlorhexidine gluconate (MEDLINE KIT)  15 mL Mouth Rinse BID  . heparin  5,000 Units Subcutaneous Q8H  . mouth rinse  15 mL Mouth Rinse 10 times per day  . pantoprazole sodium  40 mg Per Tube Daily  . sodium phosphate  Dextrose 5% IVPB  10 mmol Intravenous Once   Continuous Infusions: . sodium chloride 50 mL/hr at 11/10/2016 1914  . cisatracurium (NIMBEX) infusion 1 mcg/kg/min (11/01/2016 1955)  . fentaNYL infusion INTRAVENOUS 150 mcg/hr (11/11/2016 2158)  . midazolam (VERSED) infusion 2 mg/hr (11/07/2016 1923)  . norepinephrine (LEVOPHED) Adult infusion 15 mcg/min (11/23/16 0700)   PRN Meds: [COMPLETED] cisatracurium **AND** cisatracurium (NIMBEX) infusion **AND** cisatracurium, fentaNYL, midazolam   Vital Signs    Vitals:   11/23/16 0700 11/23/16 0736 11/23/16 0737 11/23/16 0800  BP:   (!) 170/56 133/68  Pulse: 72 73 73 72  Resp: 10 10 10 10   Temp: (!) 91.9 F (33.3 C)   (!) 92.1 F (33.4 C)  TempSrc:    Core (Comment)  SpO2: 99% 99% 100% 98%  Weight:      Height:        Intake/Output Summary (Last 24 hours) at 11/23/16 3875 Last data filed at 11/23/16 0700  Gross per 24 hour  Intake          1440.58 ml  Output              300 ml  Net          1140.58 ml   Filed Weights   10/30/2016 2000 11/23/16 0500  Weight: 169 lb 12.1 oz (77 kg) 172 lb 9.9 oz (78.3 kg)    Physical Exam   GEN: Well nourished, well developed, intubated HEENT: Grossly normal.  Neck: Supple Cardiac: RRR Respiratory:  Rhonchi bilaterally  GI: Soft, nontender,  nondistended MS: no deformity or atrophy. Skin: warm and dry, no rash. Neuro:  Intubated  Labs    CBC  Recent Labs  11/09/2016 1722  11/23/16 0400 11/23/16 0806  WBC 8.7  --  5.0  --   HGB 8.7*  < > 8.6* 9.5*  HCT 26.3*  < > 25.5* 28.0*  MCV 85.4  --  82.8  --   PLT 156  --  114*  --   < > = values in this interval not displayed. Basic Metabolic Panel  Recent Labs  10/31/2016 1722  11/18/2016 2040  11/23/16 0400 11/23/16 0806  NA 136  < >  --   < > 136 143  K 3.0*  < >  --   < > 3.3* 2.6*  CL 97*  < >  --   < > 102 102  CO2 25  --   --   --  21*  --   GLUCOSE 114*  < >  --   < > 165* 158*  BUN <5*  < >  --   < > 6 4*  CREATININE 3.86*  < >  --   < > 4.38*  3.90*  CALCIUM 7.4*  --   --   --  7.7*  --   MG  --   --  1.9  --  1.8  --   PHOS  --   --   --   --  1.3*  --   < > = values in this interval not displayed. Cardiac Enzymes  Recent Labs  11/24/2016 2040 11/23/16 0100 11/23/16 0605  TROPONINI 0.23* 0.70* 1.04*     Telemetry    Sinus with PVCs and brief NSVT - Personally Reviewed   Radiology    Dg Chest Portable 1 View  Result Date: 10/28/2016 CLINICAL DATA:  Endotracheal tube insertion EXAM: PORTABLE CHEST 1 VIEW COMPARISON:  11/08/2016 FINDINGS: The tip of an endotracheal tube is approximately 3.5 cm above the carina. External defibrillator paddle projects over the left hemi thorax. There appears to be gastric tube with tip just below the left hemidiaphragm and sideport 8 cm above the diaphragm. Electronic monitoring device projects over the right hemi thorax. Additional support tubes/lines project over the right upper quadrant of the abdomen and from below, projecting up to the distal SVC. The heart is borderline enlarged. There is mild interstitial edema. There is no pneumothorax. IMPRESSION: Endotracheal tube tip in satisfactory position approximately 3.5 cm above the carina. What appears to be a a gastric tube is noted with side port above the diaphragm by 8  cm. Cardiomegaly with mild interstitial edema. Other support lines and tubes as above. Electronically Signed   By: Ashley Royalty M.D.   On: 11/16/2016 18:10   Dg Abd Portable 1v  Result Date: 11/16/2016 CLINICAL DATA:  Orogastric tube placement EXAM: PORTABLE ABDOMEN - 1 VIEW COMPARISON:  10/29/2016 at 1800 hours FINDINGS: The tip and side port of the gastric tube project to be the left hemidiaphragm in the expected location of the gastric cardia and proximal body of the stomach. There is no free air nor bowel obstruction. No acute osseous abnormality. Right femoral line projects over the right sacral ala as before. External defibrillator paddle noted over the left lung base. IMPRESSION: Gastric tube tip and side port in stomach. Stable position of right femoral line. Electronically Signed   By: Ashley Royalty M.D.   On: 10/30/2016 21:55   Dg Abd Portable 1 View  Result Date: 11/20/2016 CLINICAL DATA:  Right femoral line insertion. EXAM: PORTABLE ABDOMEN - 1 VIEW COMPARISON:  None. FINDINGS: Single portable view of the abdomen and pelvis. This demonstrates right femoral line projecting over the upper sacrum, in the region of the common iliac vessels. Borderline gastric distension. Otherwise unremarkable bowel gas pattern. Vascular calcifications. IMPRESSION: Right femoral line projecting with tip in the expected location of the common iliac vessels. Electronically Signed   By: Abigail Miyamoto M.D.   On: 10/29/2016 18:19     Patient Profile     75 year old male with past medical history of end-stage renal disease dialysis dependent, diabetes mellitus, hypertension admitted following cardiac arrest post dialysis. Initial electrocardiogram showed markedly prolonged QT interval. Patient was on sertraline and potassium 3 at time of admission. He was intubated and cooled.  Assessment & Plan    1 status post cardiac arrest-patient arrested following dialysis. His electrocardiogram showed markedly prolonged QT  interval. He is also having frequent ectopy. I am concerned that patient had torsades related to his long QT interval. His potassium was 3.0 which could have contributed. We will also discontinue sertraline. Note reviewing prior ECGs, QT is chronically prolonged.  Troponin 1.04 likely related to cardiac arrest and CPR; however will need cath if he recovers (at risk for anoxic encephalopathy). Check echocardiogram for LV function. Follow QT interval. Patient is presently being cooled. If he recovers and event is deemed not ischemia mediated and QT remains long he may require subcutaneous defibrillator. Add aspirin.  2 VDRF-critical care medicine will be managing.  3 end-stage renal disease-patient will need nephrology consult and dialysis as needed.  4 Hypokalemia-supplement  Signed, Kirk Ruths, MD  11/23/2016, 8:22 AM

## 2016-11-23 NOTE — Progress Notes (Signed)
eLink Physician-Brief Progress Note Patient Name: Ronald Brewer DOB: 1941-04-16 MRN: [id number]   Date of Service  11/23/2016  HPI/Events of Note  abg  reeud mV abg 7 am   eICU Interventions       Intervention Category Major Interventions: Acid-Base disturbance - evaluation and management  Raylene Miyamoto. 11/23/2016, 4:48 AM

## 2016-11-23 NOTE — Progress Notes (Signed)
[facility] called and referral made per protocol.

## 2016-11-24 ENCOUNTER — Inpatient Hospital Stay (HOSPITAL_COMMUNITY): Payer: Medicare Other

## 2016-11-24 DIAGNOSIS — J9600 Acute respiratory failure, unspecified whether with hypoxia or hypercapnia: Secondary | ICD-10-CM

## 2016-11-24 LAB — CBC
HEMATOCRIT: 32.2 % — AB (ref 39.0–52.0)
Hemoglobin: 10.4 g/dL — ABNORMAL LOW (ref 13.0–17.0)
MCH: 28 pg (ref 26.0–34.0)
MCHC: 32.3 g/dL (ref 30.0–36.0)
MCV: 86.6 fL (ref 78.0–100.0)
PLATELETS: 136 10*3/uL — AB (ref 150–400)
RBC: 3.72 MIL/uL — ABNORMAL LOW (ref 4.22–5.81)
RDW: 15.4 % (ref 11.5–15.5)
WBC: 12.7 10*3/uL — ABNORMAL HIGH (ref 4.0–10.5)

## 2016-11-24 LAB — BASIC METABOLIC PANEL
Anion gap: 10 (ref 5–15)
Anion gap: 11 (ref 5–15)
Anion gap: 9 (ref 5–15)
Anion gap: 9 (ref 5–15)
BUN: 6 mg/dL (ref 6–20)
BUN: 7 mg/dL (ref 6–20)
BUN: 8 mg/dL (ref 6–20)
BUN: 9 mg/dL (ref 6–20)
CALCIUM: 7.2 mg/dL — AB (ref 8.9–10.3)
CHLORIDE: 102 mmol/L (ref 101–111)
CHLORIDE: 103 mmol/L (ref 101–111)
CHLORIDE: 103 mmol/L (ref 101–111)
CO2: 23 mmol/L (ref 22–32)
CO2: 25 mmol/L (ref 22–32)
CO2: 25 mmol/L (ref 22–32)
CO2: 25 mmol/L (ref 22–32)
CREATININE: 5.19 mg/dL — AB (ref 0.61–1.24)
Calcium: 7.4 mg/dL — ABNORMAL LOW (ref 8.9–10.3)
Calcium: 7.5 mg/dL — ABNORMAL LOW (ref 8.9–10.3)
Calcium: 7.5 mg/dL — ABNORMAL LOW (ref 8.9–10.3)
Chloride: 101 mmol/L (ref 101–111)
Creatinine, Ser: 4.8 mg/dL — ABNORMAL HIGH (ref 0.61–1.24)
Creatinine, Ser: 4.86 mg/dL — ABNORMAL HIGH (ref 0.61–1.24)
Creatinine, Ser: 5.13 mg/dL — ABNORMAL HIGH (ref 0.61–1.24)
GFR calc Af Amer: 11 mL/min — ABNORMAL LOW (ref >60)
GFR calc Af Amer: 12 mL/min — ABNORMAL LOW (ref >60)
GFR calc Af Amer: 12 mL/min — ABNORMAL LOW (ref >60)
GFR calc Af Amer: 13 mL/min — ABNORMAL LOW (ref >60)
GFR calc non Af Amer: 10 mL/min — ABNORMAL LOW (ref >60)
GFR calc non Af Amer: 11 mL/min — ABNORMAL LOW (ref >60)
GFR calc non Af Amer: 11 mL/min — ABNORMAL LOW (ref >60)
GFR, EST NON AFRICAN AMERICAN: 10 mL/min — AB (ref >60)
GLUCOSE: 52 mg/dL — AB (ref 65–99)
GLUCOSE: 68 mg/dL (ref 65–99)
GLUCOSE: 69 mg/dL (ref 65–99)
Glucose, Bld: 74 mg/dL (ref 65–99)
POTASSIUM: 3.3 mmol/L — AB (ref 3.5–5.1)
POTASSIUM: 3.4 mmol/L — AB (ref 3.5–5.1)
POTASSIUM: 3.8 mmol/L (ref 3.5–5.1)
Potassium: 4.3 mmol/L (ref 3.5–5.1)
SODIUM: 136 mmol/L (ref 135–145)
SODIUM: 137 mmol/L (ref 135–145)
Sodium: 136 mmol/L (ref 135–145)
Sodium: 137 mmol/L (ref 135–145)

## 2016-11-24 LAB — BLOOD GAS, ARTERIAL
Acid-base deficit: 0.8 mmol/L (ref 0.0–2.0)
BICARBONATE: 25.2 mmol/L (ref 20.0–28.0)
Drawn by: 280981
FIO2: 100
LHR: 10 {breaths}/min
MECHVT: 450 mL
O2 Saturation: 99.4 %
PEEP/CPAP: 5 cmH2O
Patient temperature: 98.6
pCO2 arterial: 57 mmHg — ABNORMAL HIGH (ref 32.0–48.0)
pH, Arterial: 7.269 — ABNORMAL LOW (ref 7.350–7.450)
pO2, Arterial: 198 mmHg — ABNORMAL HIGH (ref 83.0–108.0)

## 2016-11-24 LAB — ECHOCARDIOGRAM COMPLETE
HEIGHTINCHES: 70 in
Weight: 2761.92 oz

## 2016-11-24 LAB — GLUCOSE, CAPILLARY
GLUCOSE-CAPILLARY: 168 mg/dL — AB (ref 65–99)
GLUCOSE-CAPILLARY: 65 mg/dL (ref 65–99)
GLUCOSE-CAPILLARY: 78 mg/dL (ref 65–99)
Glucose-Capillary: 179 mg/dL — ABNORMAL HIGH (ref 65–99)
Glucose-Capillary: 56 mg/dL — ABNORMAL LOW (ref 65–99)
Glucose-Capillary: 67 mg/dL (ref 65–99)
Glucose-Capillary: 68 mg/dL (ref 65–99)
Glucose-Capillary: 91 mg/dL (ref 65–99)

## 2016-11-24 LAB — PHOSPHORUS
Phosphorus: 4 mg/dL (ref 2.5–4.6)
Phosphorus: 4.8 mg/dL — ABNORMAL HIGH (ref 2.5–4.6)

## 2016-11-24 LAB — MAGNESIUM
Magnesium: 1.8 mg/dL (ref 1.7–2.4)
Magnesium: 1.7 mg/dL (ref 1.7–2.4)

## 2016-11-24 MED ORDER — PRO-STAT SUGAR FREE PO LIQD
30.0000 mL | Freq: Three times a day (TID) | ORAL | Status: DC
  Administered 2016-11-24 – 2016-12-02 (×23): 30 mL
  Filled 2016-11-24 (×23): qty 30

## 2016-11-24 MED ORDER — DEXTROSE 50 % IV SOLN
50.0000 mL | Freq: Once | INTRAVENOUS | Status: AC
  Administered 2016-11-24: 50 mL via INTRAVENOUS
  Filled 2016-11-24: qty 50

## 2016-11-24 MED ORDER — DEXTROSE 10 % IV SOLN
INTRAVENOUS | Status: DC
  Administered 2016-11-24: 09:00:00 via INTRAVENOUS

## 2016-11-24 MED ORDER — DEXTROSE-NACL 5-0.9 % IV SOLN
INTRAVENOUS | Status: DC
  Administered 2016-11-24: via INTRAVENOUS

## 2016-11-24 MED ORDER — NEPRO/CARBSTEADY PO LIQD
1000.0000 mL | ORAL | Status: DC
  Administered 2016-11-24 – 2016-12-01 (×8): 1000 mL
  Filled 2016-11-24 (×10): qty 1000

## 2016-11-24 MED ORDER — DEXTROSE 50 % IV SOLN: 50.0000 mL | Freq: Once | INTRAVENOUS | Status: DC

## 2016-11-24 MED ORDER — VASOPRESSIN 20 UNIT/ML IV SOLN
0.0300 [IU]/min | INTRAVENOUS | Status: DC
  Administered 2016-11-24 – 2016-11-25 (×2): 0.03 [IU]/min via INTRAVENOUS
  Filled 2016-11-24 (×4): qty 2

## 2016-11-24 NOTE — Progress Notes (Addendum)
229ml of Fentanyl and 82ml of Versed wasted with Devoria Glassing, RN.   Will continue to monitor.

## 2016-11-24 NOTE — Progress Notes (Signed)
eLink Physician-Brief Progress Note Patient Name: Ronald Brewer DOB: 1941/04/20 MRN: [id number]   Date of Service  11/24/2016  HPI/Events of Note  Hypoglycemia in patient on hypothermia protocol and ESRD.  eICU Interventions  Plan: Additional amp of D50 IVP Start D5NS at 30 cc/hr Continue to monitor blood sugar     Intervention Category Intermediate Interventions: Other:  Shashank Kwasnik 11/24/2016, 12:14 AM

## 2016-11-24 NOTE — Procedures (Signed)
Spo2 reading 80%, ? Reading, ABG done to confirm, Elink called to give results, no new orders at this time.

## 2016-11-24 NOTE — Progress Notes (Signed)
[facility] Progress Note   Subjective: off of cooling protocol and off of sedation, not responding.    Vitals:   11/24/16 1000 11/24/16 1100 11/24/16 1130 11/24/16 1200  BP: (!) 128/57  (!) 138/50 (!) 118/55  Pulse: 90 89 91 94  Resp: 10 11 10 12   Temp: 98.2 F (36.8 C) 98.6 F (37 C)  99 F (37.2 C)  TempSrc: Core (Comment) Core (Comment)  Core (Comment)  SpO2: 100% 100% 100% 100%  Weight:      Height:        Inpatient medications: . artificial tears  1 application Both Eyes U5K  . aspirin  325 mg Per NG tube Daily  . calcitRIOL  1.3 mcg Intravenous Q M,W,F-HD  . chlorhexidine gluconate (MEDLINE KIT)  15 mL Mouth Rinse BID  . darbepoetin (ARANESP) injection - DIALYSIS  60 mcg Intravenous Q Wed-HD  . dextrose  50 mL Intravenous Once  . heparin  5,000 Units Subcutaneous Q8H  . mouth rinse  15 mL Mouth Rinse 10 times per day  . pantoprazole sodium  40 mg Per Tube Daily  . sodium chloride flush  10-40 mL Intracatheter Q12H   . sodium chloride 10 mL/hr at 11/24/16 0402  . dextrose 40 mL/hr at 11/24/16 0839  . fentaNYL infusion INTRAVENOUS Stopped (11/24/16 0920)  . norepinephrine (LEVOPHED) Adult infusion 29 mcg/min (11/24/16 1215)  . vasopressin (PITRESSIN) infusion - *FOR SHOCK* 0.03 Units/min (11/24/16 1148)   fentaNYL, sodium chloride flush  Exam: General: on vent, not responsive, negative Doll's eyes HEENT: intubated/oral ET tub Lungs:  clear bilat ant/ lat Heart: RRR  Abdomen:  soft dim BS, right fem central line Ext: mild LE/ UE edema Neuro:  unresponsive Dialysis Access: left upper AVF + bruit  Dialysis Orders: MWF [facility] 3.75 hours 500/800 EDW 77 2 K 2 Ca left upper AVF heparin 2000 Mircera 75 q 2 weeks-lst 11/15 calcitriol 1.25 - iPTH 190 - Ca usually 9s and P in goal  Assessment: 1. SP cardiac arrest- V fib arrest. Cards seeing 2. VDRF - sp arrest 3. 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 ESRD- usual HD MWF 5. Volume - up 3-4kg by  wt 6. Hypotension - pressor dependent 7. Anemia  - hgb 8.7 post HD up to 10.9 without transfusion - last outpt Hgb was 10.6 11/15 with 31% sat; redose next tmt at 60  8. Metabolic bone disease -  Continue calcitriol - covert to IV 9. Nutrition - feedings not initiated yet 10. EOL - made limited code today  Plan - HD today, gentle Rx, minimal UF    Kelly Splinter MD [place] pager [phone]   11/24/2016, 12:22 PM    Recent Labs Lab 11/23/16 0400  11/24/16 0400 11/24/16 0736 11/24/16 1146  NA 136  < > 136 137 136  K 3.3*  < > 3.4* 3.8 4.3  CL 102  < > 102 103 101  CO2 21*  < > 25 25 25   GLUCOSE 165*  < > 69 74 68  BUN 6  < > 6 8 9   CREATININE 4.38*  < > 4.80* 5.13* 5.19*  CALCIUM 7.7*  < > 7.5* 7.4* 7.2*  PHOS 1.3*  --  4.0  --   --   < > = values in this interval not displayed. No results for input(s): AST, ALT, ALKPHOS, BILITOT, PROT, ALBUMIN in the last 168 hours.  Recent Labs Lab 11/24/2016 1722  11/23/16 0400  11/23/16 1201  11/23/16 1601 11/24/16 0400  WBC 8.7  --  5.0  --   --   --  12.7*  HGB 8.7*  < > 8.6*  < > 10.9* 11.6* 10.4*  HCT 26.3*  < > 25.5*  < > 32.0* 34.0* 32.2*  MCV 85.4  --  82.8  --   --   --  86.6  PLT 156  --  114*  --   --   --  136*  < > = values in this interval not displayed. Iron/TIBC/Ferritin/ %Sat No results found for: IRON, TIBC, FERRITIN, IRONPCTSAT

## 2016-11-24 NOTE — Progress Notes (Signed)
Patient having multiple frequent PVCs. Arterial blood pressure has a MAP lower than 80 with continuous titration of Levophed. Deterding, MD called and notified. No new orders given at this time.

## 2016-11-24 NOTE — Progress Notes (Signed)
Pt. Blood glucose 65 this am.  Talked with Dr. Elsworth Soho, D-5 NS d/ced and D-10 at 40 ml/hr started.  Will continue to monitor.

## 2016-11-24 NOTE — Progress Notes (Signed)
Hypoglycemic Event  CBG: 67  Treatment: D50 IV 50 mL  Symptoms: None  Follow-up CBG: Time: 0419 CBG Result: 179  Possible Reasons for Event: Inadequate meal intake  Comments/MD notified: Protocol effective    Ronald Brewer

## 2016-11-24 NOTE — Progress Notes (Signed)
Initial Nutrition Assessment  INTERVENTION:   Nepro @ 30 ml/hr (720 ml/day) 30 ml Prostat TID Provides: 1596 kcal, 103 grams protein, and 523 ml H2O.   NUTRITION DIAGNOSIS:   Inadequate oral intake related to inability to eat as evidenced by NPO status.  GOAL:   Patient will meet greater than or equal to 90% of their needs  MONITOR:   TF tolerance, Skin, I & O's, Labs  REASON FOR ASSESSMENT:   Consult Enteral/tube feeding initiation and management  ASSESSMENT:   Pt with hx of DM, ESRD on HD admitted after witnessed cardiac arrest at dialysis 11/27 in setting of prolonged QT and postdialysis electrolyte shifts.    RN at bedside, no family present.   Patient is currently intubated on ventilator support MV: 4.7 L/min Temp (24hrs), Avg:94.8 F (34.9 C), Min:89.8 F (32.1 C), Max:99.1 F (37.3 C)  Medications reviewed and include: D10 @ 40 ml/hr Labs reviewed: CBG's: 65-68 Nutrition-Focused physical exam completed. Findings are no fat depletion, mild muscle depletion of temples, and no edema.    Diet Order:    NPO  Skin:  Wound (see comment) (abrasions sacrum, excoriated buttocks, arm, back and leg)  Last BM:  11/27  Height:   Ht Readings from Last 1 Encounters:  11/10/2016 5\' 10"  (1.778 m)    Weight:   Wt Readings from Last 1 Encounters:  11/24/16 180 lb 2.9 oz (81.7 kg)    Ideal Body Weight:  75.4 kg  BMI:  Body mass index is 25.85 kg/m.  Estimated Nutritional Needs:   Kcal:  1623  Protein:  100-115 grams  Fluid:  > 1.6 L/day  EDUCATION NEEDS:   No education needs identified at this time  [place], [place], [place] Pager [phone] After Hours Pager

## 2016-11-24 NOTE — Progress Notes (Signed)
Patient Name: Ronald Brewer Date of Encounter: 11/24/2016  Primary Cardiologist: Dr [facility] Problem List     Active Problems:   ESRD on dialysis [facility])   Anemia   Hypokalemia   Cardiac arrest with ventricular fibrillation (HCC)   Prolonged QT interval   Cardiac arrest (HCC)     Subjective   Intubated and sedated  Inpatient Medications    Scheduled Meds: . artificial tears  1 application Both Eyes H4R  . aspirin  325 mg Per NG tube Daily  . calcitRIOL  1.3 mcg Intravenous Q M,W,F-HD  . chlorhexidine gluconate (MEDLINE KIT)  15 mL Mouth Rinse BID  . darbepoetin (ARANESP) injection - DIALYSIS  60 mcg Intravenous Q Wed-HD  . dextrose  50 mL Intravenous Once  . heparin  5,000 Units Subcutaneous Q8H  . insulin aspart  0-15 Units Subcutaneous Q4H  . mouth rinse  15 mL Mouth Rinse 10 times per day  . pantoprazole sodium  40 mg Per Tube Daily  . sodium chloride flush  10-40 mL Intracatheter Q12H   Continuous Infusions: . sodium chloride 10 mL/hr at 11/24/16 0402  . cisatracurium (NIMBEX) infusion Stopped (11/24/16 0720)  . dextrose    . fentaNYL infusion INTRAVENOUS 150 mcg/hr (11/24/16 0227)  . midazolam (VERSED) infusion 2 mg/hr (11/23/16 0840)  . norepinephrine (LEVOPHED) Adult infusion 44 mcg/min (11/24/16 0743)   PRN Meds: [COMPLETED] cisatracurium **AND** cisatracurium (NIMBEX) infusion **AND** cisatracurium, fentaNYL, midazolam, sodium chloride flush   Vital Signs    Vitals:   11/24/16 0643 11/24/16 0700 11/24/16 0728 11/24/16 0800  BP:   (!) 119/58 (!) 130/57  Pulse: 87 88 89 89  Resp: 10 10 10 10   Temp:  (!) 96.4 F (35.8 C)  97.7 F (36.5 C)  TempSrc:    Core (Comment)  SpO2: 100% 100% 100% 100%  Weight:      Height:        Intake/Output Summary (Last 24 hours) at 11/24/16 0836 Last data filed at 11/24/16 0800  Gross per 24 hour  Intake          2185.66 ml  Output                0 ml  Net          2185.66 ml   Filed Weights   11/12/2016 2000 11/23/16 0500 11/24/16 0600  Weight: 169 lb 12.1 oz (77 kg) 172 lb 9.9 oz (78.3 kg) 180 lb 2.9 oz (81.7 kg)    Physical Exam   GEN: Well nourished, well developed, intubated HEENT: Grossly normal.  Neck: Supple Cardiac: RRR Respiratory:  Rhonchi bilaterally  GI: Soft, nontender, nondistended MS: no deformity or atrophy. Skin: warm and dry, no rash. Neuro:  Intubated  Labs    CBC  Recent Labs  11/23/16 0400  11/23/16 1601 11/24/16 0400  WBC 5.0  --   --  12.7*  HGB 8.6*  < > 11.6* 10.4*  HCT 25.5*  < > 34.0* 32.2*  MCV 82.8  --   --  86.6  PLT 114*  --   --  136*  < > = values in this interval not displayed. Basic Metabolic Panel  Recent Labs  11/23/16 0400  11/24/16 0400 11/24/16 0736  NA 136  < > 136 137  K 3.3*  < > 3.4* 3.8  CL 102  < > 102 103  CO2 21*  < > 25 25  GLUCOSE 165*  < > 69 74  BUN 6  < > 6 8  CREATININE 4.38*  < > 4.80* 5.13*  CALCIUM 7.7*  < > 7.5* 7.4*  MG 1.8  --  1.8  --   PHOS 1.3*  --  4.0  --   < > = values in this interval not displayed. Cardiac Enzymes  Recent Labs  11/02/2016 2040 11/23/16 0100 11/23/16 0605  TROPONINI 0.23* 0.70* 1.04*     Telemetry    Sinus with PVCs and brief NSVT - Personally Reviewed   Radiology    Dg Chest Port 1 View  Result Date: 11/24/2016 CLINICAL DATA:  Acute respiratory failure. EXAM: PORTABLE CHEST 1 VIEW COMPARISON:  Radiograph of November 22, 2016. FINDINGS: Stable cardiomediastinal silhouette. No pneumothorax is noted. Nasogastric tube tube tip is now seen in proximal stomach. Minimal left basilar subsegmental atelectasis is noted. Elevated right hemidiaphragm is now noted with minimal subsegmental atelectasis. IMPRESSION: Endotracheal nasogastric tubes in good position. Stable minimal left basilar subsegmental atelectasis. Interval development of elevated right hemidiaphragm with minimal right basilar subsegmental atelectasis. Continued radiographic follow-up is recommended.  Electronically Signed   By: Marijo Conception, M.D.   On: 11/24/2016 07:35   Dg Chest Portable 1 View  Result Date: 10/28/2016 CLINICAL DATA:  Endotracheal tube insertion EXAM: PORTABLE CHEST 1 VIEW COMPARISON:  11/08/2016 FINDINGS: The tip of an endotracheal tube is approximately 3.5 cm above the carina. External defibrillator paddle projects over the left hemi thorax. There appears to be gastric tube with tip just below the left hemidiaphragm and sideport 8 cm above the diaphragm. Electronic monitoring device projects over the right hemi thorax. Additional support tubes/lines project over the right upper quadrant of the abdomen and from below, projecting up to the distal SVC. The heart is borderline enlarged. There is mild interstitial edema. There is no pneumothorax. IMPRESSION: Endotracheal tube tip in satisfactory position approximately 3.5 cm above the carina. What appears to be a a gastric tube is noted with side port above the diaphragm by 8 cm. Cardiomegaly with mild interstitial edema. Other support lines and tubes as above. Electronically Signed   By: Ashley Royalty M.D.   On: 10/29/2016 18:10   Dg Abd Portable 1v  Result Date: 11/01/2016 CLINICAL DATA:  Orogastric tube placement EXAM: PORTABLE ABDOMEN - 1 VIEW COMPARISON:  11/14/2016 at 1800 hours FINDINGS: The tip and side port of the gastric tube project to be the left hemidiaphragm in the expected location of the gastric cardia and proximal body of the stomach. There is no free air nor bowel obstruction. No acute osseous abnormality. Right femoral line projects over the right sacral ala as before. External defibrillator paddle noted over the left lung base. IMPRESSION: Gastric tube tip and side port in stomach. Stable position of right femoral line. Electronically Signed   By: Ashley Royalty M.D.   On: 11/03/2016 21:55   Dg Abd Portable 1 View  Result Date: 11/13/2016 CLINICAL DATA:  Right femoral line insertion. EXAM: PORTABLE ABDOMEN - 1 VIEW  COMPARISON:  None. FINDINGS: Single portable view of the abdomen and pelvis. This demonstrates right femoral line projecting over the upper sacrum, in the region of the common iliac vessels. Borderline gastric distension. Otherwise unremarkable bowel gas pattern. Vascular calcifications. IMPRESSION: Right femoral line projecting with tip in the expected location of the common iliac vessels. Electronically Signed   By: Abigail Miyamoto M.D.   On: 10/29/2016 18:19     Patient Profile     75 year old male with past medical  history of end-stage renal disease dialysis dependent, diabetes mellitus, hypertension admitted following cardiac arrest post dialysis. Initial electrocardiogram showed markedly prolonged QT interval. Patient was on sertraline and potassium 3 at time of admission. He was intubated and cooled.  Assessment & Plan    1 status post cardiac arrest-patient arrested following dialysis. His electrocardiogram showed markedly prolonged QT interval. He was also having frequent ectopy. I am concerned that patient had torsades related to his long QT interval. His potassium was 3.0 which could have contributed. We discontinued sertraline. Note reviewing prior ECGs, QT is chronically prolonged. Troponin 1.04 likely related to cardiac arrest and CPR; however will need cath if he recovers (at risk for anoxic encephalopathy). Await echocardiogram for LV function. Follow QT interval. Patient is presently being rewarmed. If he recovers and event is deemed not ischemia mediated and QT remains long he may require subcutaneous defibrillator.   2 VDRF-critical care medicine will be managing.  3 end-stage renal disease-patient will need nephrology consult and dialysis as needed.  4 Hypokalemia-improved  Signed, Kirk Ruths, MD  11/24/2016, 8:36 AM

## 2016-11-24 NOTE — Progress Notes (Signed)
PULMONARY / CRITICAL CARE MEDICINE   Name: Ronald Brewer MRN: [id number] DOB: [birth year]    ADMISSION DATE:  11/09/2016 CONSULTATION DATE:  11/24/2016   CHIEF COMPLAINT:  Cardiac arrest  HISTORY OF PRESENT ILLNESS:   75 year old man with ESRD on hemodialysis and CAD was brought in by EMS after a witnessed cardiac arrest at dialysis 11/27. He finished his treatment, then had a syncopal episode and vomited, again syncope with loss of pulse-shock advised the AED. After EMS arrival, noted to be in V. fib given epinephrine 1 with ROSC, total CPR time about 15 minutes. He is a diabetic hypertensive neck: 2011 shows normal ejection fraction He was admitted 10/2016 for generalized weakness, black stools and hemoglobin about 9 attributed to anemia of chronic disease He remained unresponsive after arrival to the emergency room had nonpurposeful movement of both upper extremities EKG showed prolonged QT, frequent PVCs   SUBJECTIVE:  Remains comatose, critically ill,  being rewarmed On levophed gtt@ 40 mcg  VITAL SIGNS: BP (!) 128/57   Pulse 89   Temp 98.2 F (36.8 C) (Core (Comment))   Resp 11   Ht 5\' 10"  (1.778 m)   Wt 180 lb 2.9 oz (81.7 kg)   SpO2 100%   BMI 25.85 kg/m   HEMODYNAMICS: CVP:  [8 mmHg-13 mmHg] 8 mmHg  VENTILATOR SETTINGS: Vent Mode: PRVC FiO2 (%):  [40 %-50 %] 50 % Set Rate:  [10 bmp] 10 bmp Vt Set:  [450 mL] 450 mL PEEP:  [5 cmH20] 5 cmH20 Plateau Pressure:  [19 cmH20-24 cmH20] 19 cmH20  INTAKE / OUTPUT: I/O last 3 completed shifts: In: 3677.7 [I.V.:3032.7; NG/GT:60; IV Piggyback:585] Out: 300 [Emesis/NG output:300]  PHYSICAL EXAMINATION:   General:  chronically ill-appearing elderly  intubated ,  Neuro:  Unresponsive, GCS 3 HEENT:  Oral ET tube, pupils 3 mm bilaterally equally reactive to light  S1-S2 normal, no rub, no murmur Decreased breath sounds bilateral, no rhonchi   soft and nontender abdomen Extremities-left AV fistula with thrill, right  femoral CVL   LABS:  BMET  Recent Labs Lab 11/24/16 0000 11/24/16 0400 11/24/16 0736  NA 137 136 137  K 3.3* 3.4* 3.8  CL 103 102 103  CO2 23 25 25   BUN 7 6 8   CREATININE 4.86* 4.80* 5.13*  GLUCOSE 52* 69 74    Electrolytes  Recent Labs Lab 11/21/2016 2040 11/23/16 0400  11/24/16 0000 11/24/16 0400 11/24/16 0736  CALCIUM  --  7.7*  < > 7.5* 7.5* 7.4*  MG 1.9 1.8  --   --  1.8  --   PHOS  --  1.3*  --   --  4.0  --   < > = values in this interval not displayed.  CBC  Recent Labs Lab 11/23/2016 1722  11/23/16 0400  11/23/16 1201 11/23/16 1601 11/24/16 0400  WBC 8.7  --  5.0  --   --   --  12.7*  HGB 8.7*  < > 8.6*  < > 10.9* 11.6* 10.4*  HCT 26.3*  < > 25.5*  < > 32.0* 34.0* 32.2*  PLT 156  --  114*  --   --   --  136*  < > = values in this interval not displayed.  Coag's  Recent Labs Lab 11/16/2016 1722 11/23/16 0205  APTT 40* 41*  INR 1.35 1.47    Sepsis Markers  Recent Labs Lab 11/16/2016 1732  LATICACIDVEN 6.09*    ABG  Recent Labs Lab 11/10/2016 1723 11/23/16  [id number] 11/23/16 0646  PHART 7.423 7.621* 7.374  PCO2ART 47.3 21.0* 44.2  PO2ART 352.0* 269* 115*    Liver Enzymes No results for input(s): AST, ALT, ALKPHOS, BILITOT, ALBUMIN in the last 168 hours.  Cardiac Enzymes  Recent Labs Lab 11/21/2016 2040 11/23/16 0100 11/23/16 0605  TROPONINI 0.23* 0.70* 1.04*    Glucose  Recent Labs Lab 11/23/16 2033 11/24/16 0006 11/24/16 0033 11/24/16 0355 11/24/16 0419 11/24/16 0820  GLUCAP 190* 56* 168* 67 179* 65    Imaging Dg Chest Port 1 View  Result Date: 11/24/2016 CLINICAL DATA:  Acute respiratory failure. EXAM: PORTABLE CHEST 1 VIEW COMPARISON:  Radiograph of November 22, 2016. FINDINGS: Stable cardiomediastinal silhouette. No pneumothorax is noted. Nasogastric tube tube tip is now seen in proximal stomach. Minimal left basilar subsegmental atelectasis is noted. Elevated right hemidiaphragm is now noted with minimal  subsegmental atelectasis. IMPRESSION: Endotracheal nasogastric tubes in good position. Stable minimal left basilar subsegmental atelectasis. Interval development of elevated right hemidiaphragm with minimal right basilar subsegmental atelectasis. Continued radiographic follow-up is recommended. Electronically Signed   By: Marijo Conception, M.D.   On: 11/24/2016 07:35     STUDIES:  Echo 11/28>>  CULTURES:   ANTIBIOTICS:   SIGNIFICANT EVENTS: 11/27 admit  LINES/TUBES: 11/27 rt fem CVL >> 11/27 ETT >>  DISCUSSION:  Cardiac arrest due to ventricular fibrillation in the setting of prolonged QT this may be due to postdialysis shift electrolytes , medication such as Zoloft, intrinsic cardiac disease, acute ischemia less likely   PULMONARY A:  Acute respiratory failure P:   Prvc, vent settings were reviewed and adjusted   keep respiratory rate at 10  CARDIOVASCULAR A:   ventricular fibrillation Prolonged QT -610 11/28 P:  Cardiology following Follow QT Replete electrolytes Await echo May need defibrillator if no evidence of acute ischemia  RENAL A:    hypokalemia hypophos ESRD P:   Follow bmet , avoid repletion now that rewarming  GI A:    no issues P:   Npo protonix  Start TFs  Heme A:    anemia of chronic disease P:  Follow  ID A:    no issues P:   Concern for aspiration but hold off antibiotics for now   EndoCRINE A:   DM-2   P:   SSI -mod   NEUROLOGIC A:   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 Critical Care Time devoted to patient care services described in this note independent of APP time is 35 minutes.    Kara Mead MD. [place]. [facility] Pager [phone] If no response call [phone]    11/24/2016, 11:05 AM

## 2016-11-24 NOTE — Progress Notes (Signed)
Hypoglycemic Event  CBG: 56  Treatment: D50 IV 50 mL  Symptoms: None  Follow-up CBG: Time: 0033 CBG Result: 168  Possible Reasons for Event: Inadequate meal intake  Comments/MD notified: Deterding, MD called and notified. Additional orders for D5NS at 17ml/hr given.     Ronald Brewer

## 2016-11-24 NOTE — Progress Notes (Signed)
Inpatient Diabetes Program Recommendations  AACE/ADA: New Consensus Statement on Inpatient Glycemic Control (2015)  Target Ranges:  Prepandial:   less than 140 mg/dL      Peak postprandial:   less than 180 mg/dL (1-2 hours)      Critically ill patients:  140 - 180 mg/dL   Lab Results  Component Value Date   GLUCAP 179 (H) 11/24/2016    Review of Glycemic Control Results for Ronald Brewer, Ronald Brewer (MRN [id number]) as of 11/24/2016 07:58  Ref. Range 11/23/2016 20:33 11/24/2016 00:06 11/24/2016 00:33 11/24/2016 03:55 11/24/2016 04:19  Glucose-Capillary Latest Ref Range: 65 - 99 mg/dL 190 (H) 56 (L) 168 (H) 67 179 (H)   Diabetes history: No prior dx DM Current orders for Inpatient glycemic control: Novolog correction 0-15 units q 4 hrs.  Inpatient Diabetes Program Recommendations:  Noted hypoglycemia post Novolog correction.  Please consider decrease in correction to sensitive 0-9 q 4 hrs.  Thank you, Ronald Brewer. Ronald Aquilino, RN, MSN, CDE Inpatient Glycemic Control Team Team Pager [phone] (8am-5pm) 11/24/2016 8:01 AM

## 2016-11-24 NOTE — Progress Notes (Signed)
Pt. At 36 degrees at 7:20, Nimbex off and Fentanyl / Versed off at 9:20.  Pt. New MAP goal 65.  Will start weaning levophed for new MAP goal.  Will continue to monitor closely.

## 2016-11-25 ENCOUNTER — Encounter (HOSPITAL_COMMUNITY): Payer: Self-pay

## 2016-11-25 ENCOUNTER — Inpatient Hospital Stay (HOSPITAL_COMMUNITY): Payer: Medicare Other

## 2016-11-25 DIAGNOSIS — G931 Anoxic brain damage, not elsewhere classified: Secondary | ICD-10-CM

## 2016-11-25 DIAGNOSIS — Z515 Encounter for palliative care: Secondary | ICD-10-CM

## 2016-11-25 DIAGNOSIS — Z7189 Other specified counseling: Secondary | ICD-10-CM

## 2016-11-25 LAB — CBC
HCT: 30.4 % — ABNORMAL LOW (ref 39.0–52.0)
HCT: 27.1 % — ABNORMAL LOW (ref 39.0–52.0)
HEMOGLOBIN: 9.7 g/dL — AB (ref 13.0–17.0)
Hemoglobin: 8.8 g/dL — ABNORMAL LOW (ref 13.0–17.0)
MCH: 28 pg (ref 26.0–34.0)
MCH: 28.3 pg (ref 26.0–34.0)
MCHC: 31.9 g/dL (ref 30.0–36.0)
MCHC: 32.5 g/dL (ref 30.0–36.0)
MCV: 87.6 fL (ref 78.0–100.0)
MCV: 87.1 fL (ref 78.0–100.0)
PLATELETS: 100 10*3/uL — AB (ref 150–400)
Platelets: 77 10*3/uL — ABNORMAL LOW (ref 150–400)
RBC: 3.47 MIL/uL — AB (ref 4.22–5.81)
RBC: 3.11 MIL/uL — ABNORMAL LOW (ref 4.22–5.81)
RDW: 15.6 % — ABNORMAL HIGH (ref 11.5–15.5)
RDW: 15.3 % (ref 11.5–15.5)
WBC: 20.4 10*3/uL — AB (ref 4.0–10.5)
WBC: 16.6 10*3/uL — ABNORMAL HIGH (ref 4.0–10.5)

## 2016-11-25 LAB — BASIC METABOLIC PANEL
ANION GAP: 9 (ref 5–15)
ANION GAP: 9 (ref 5–15)
BUN: 9 mg/dL (ref 6–20)
BUN: 9 mg/dL (ref 6–20)
CALCIUM: 7 mg/dL — AB (ref 8.9–10.3)
CHLORIDE: 100 mmol/L — AB (ref 101–111)
CO2: 24 mmol/L (ref 22–32)
CO2: 25 mmol/L (ref 22–32)
Calcium: 7.1 mg/dL — ABNORMAL LOW (ref 8.9–10.3)
Chloride: 97 mmol/L — ABNORMAL LOW (ref 101–111)
Creatinine, Ser: 5.97 mg/dL — ABNORMAL HIGH (ref 0.61–1.24)
Creatinine, Ser: 3.95 mg/dL — ABNORMAL HIGH (ref 0.61–1.24)
GFR calc Af Amer: 10 mL/min — ABNORMAL LOW (ref >60)
GFR calc Af Amer: 16 mL/min — ABNORMAL LOW (ref >60)
GFR calc non Af Amer: 14 mL/min — ABNORMAL LOW (ref >60)
GFR, EST NON AFRICAN AMERICAN: 8 mL/min — AB (ref >60)
GLUCOSE: 136 mg/dL — AB (ref 65–99)
Glucose, Bld: 102 mg/dL — ABNORMAL HIGH (ref 65–99)
POTASSIUM: 5.2 mmol/L — AB (ref 3.5–5.1)
Potassium: 3.5 mmol/L (ref 3.5–5.1)
SODIUM: 133 mmol/L — AB (ref 135–145)
Sodium: 131 mmol/L — ABNORMAL LOW (ref 135–145)

## 2016-11-25 LAB — RENAL FUNCTION PANEL
Albumin: 1.6 g/dL — ABNORMAL LOW (ref 3.5–5.0)
Anion gap: 10 (ref 5–15)
BUN: 7 mg/dL (ref 6–20)
CO2: 26 mmol/L (ref 22–32)
Calcium: 7 mg/dL — ABNORMAL LOW (ref 8.9–10.3)
Chloride: 97 mmol/L — ABNORMAL LOW (ref 101–111)
Creatinine, Ser: 3.86 mg/dL — ABNORMAL HIGH (ref 0.61–1.24)
GFR calc Af Amer: 16 mL/min — ABNORMAL LOW (ref >60)
GFR calc non Af Amer: 14 mL/min — ABNORMAL LOW (ref >60)
Glucose, Bld: 111 mg/dL — ABNORMAL HIGH (ref 65–99)
Phosphorus: 2.9 mg/dL (ref 2.5–4.6)
Potassium: 3.8 mmol/L (ref 3.5–5.1)
Sodium: 133 mmol/L — ABNORMAL LOW (ref 135–145)

## 2016-11-25 LAB — GLUCOSE, CAPILLARY
GLUCOSE-CAPILLARY: 103 mg/dL — AB (ref 65–99)
GLUCOSE-CAPILLARY: 96 mg/dL (ref 65–99)
Glucose-Capillary: 110 mg/dL — ABNORMAL HIGH (ref 65–99)
Glucose-Capillary: 122 mg/dL — ABNORMAL HIGH (ref 65–99)

## 2016-11-25 LAB — PHOSPHORUS
PHOSPHORUS: 5 mg/dL — AB (ref 2.5–4.6)
PHOSPHORUS: 2.8 mg/dL (ref 2.5–4.6)

## 2016-11-25 LAB — MAGNESIUM
MAGNESIUM: 1.7 mg/dL (ref 1.7–2.4)
MAGNESIUM: 1.6 mg/dL — AB (ref 1.7–2.4)

## 2016-11-25 MED ORDER — SODIUM CHLORIDE 0.9 % IV SOLN: 100.0000 mL | INTRAVENOUS | Status: DC | PRN

## 2016-11-25 MED ORDER — HEPARIN SODIUM (PORCINE) 1000 UNIT/ML DIALYSIS: 1000.0000 [IU] | INTRAMUSCULAR | Status: DC | PRN

## 2016-11-25 MED ORDER — LIDOCAINE-PRILOCAINE 2.5-2.5 % EX CREA
1.0000 "application " | TOPICAL_CREAM | CUTANEOUS | Status: DC | PRN
  Filled 2016-11-25: qty 5

## 2016-11-25 MED ORDER — FENTANYL CITRATE (PF) 100 MCG/2ML IJ SOLN
25.0000 ug | INTRAMUSCULAR | Status: DC | PRN
  Administered 2016-11-28 – 2016-11-30 (×13): 100 ug via INTRAVENOUS
  Filled 2016-11-25 (×12): qty 2

## 2016-11-25 MED ORDER — LIDOCAINE HCL (PF) 1 % IJ SOLN: 5.0000 mL | INTRAMUSCULAR | Status: DC | PRN

## 2016-11-25 MED ORDER — ALTEPLASE 2 MG IJ SOLR: 2.0000 mg | Freq: Once | INTRAMUSCULAR | Status: DC | PRN

## 2016-11-25 MED ORDER — PENTAFLUOROPROP-TETRAFLUOROETH EX AERO
1.0000 "application " | INHALATION_SPRAY | CUTANEOUS | Status: DC | PRN
  Filled 2016-11-25: qty 30

## 2016-11-25 MED ORDER — HEPARIN SODIUM (PORCINE) 1000 UNIT/ML DIALYSIS: 20.0000 [IU]/kg | INTRAMUSCULAR | Status: DC | PRN

## 2016-11-25 NOTE — Consult Note (Signed)
Consultation Note Date: 11/25/2016   Patient Name: Ronald Brewer  DOB: 03/21/1941  MRN: [id number]  Age / Sex: 75 y.o., male  PCP: Mauricia Area, MD Referring Physician: Rigoberto Noel, MD  Reason for Consultation: Establishing goals of care  HPI/Patient Profile: 75 y.o. male  with past medical history of CAD and MI, ESRD on HD, and DM who was admitted on 11/07/2016 with cardiac arrest in hemodialysis.  Ronald Brewer reportedly received 15 minutes of CPR including defibrillation.  He is now in ICU intubated and unresponsive.  He is on pressors and receiving hemodialysis.  His EEG shows severe diffuse cerebral dysfunction.  There is concern for anoxic brain injury. PMT was consulted for goals of care.  Clinical Assessment and Goals of Care: After examining the patient I spoke with Drs. Schertz and Elsworth Soho and called Ronald Brewer, the patient's wife.  Ronald Brewer tells me that her husband has been "ready to go" for 2 or 3 months.  She says he stopped taking his medications and did not want to continue hemodialysis.  She comments that "they should have never shocked him back".   I asked if we needed to plan for a comfortable natural passing and she seemed surprised.  She said has has continued HD because she has pushed him and prayed over him.  At home over the last couple of weeks he has walked on his walker ("bent over") he has significant pain in his back, legs and knees.  He has severe itching and scratches himself until his bleeds.  He has been eating very little.  He eats a lot of ice for his dry mouth.  When he does eat he has immediate bowel incontinence and Ronald Brewer cleans him up.  He was admitted on 11/13 for melena, but did not want to come to the hospital.  The couple does not have a supportive family.  Ronald Brewer does have a daughter in [place] who is addicted to drugs and currently missing according to Rev.  Amedeo Brewer.  There is no other family in the area.  Ronald Brewer states she used to be a Quarry manager and an Therapist, sports and she used to clean Ronald Brewer family  Home.  Primary Decision Maker:  Ronald Brewer    SUMMARY OF RECOMMENDATIONS    Family Meeting:  12/2 at 12:00 pm for [place].  The wife's comments today on the phone were very inconsistent.  She states he wants to pass and that he does not want HD, yet the idea of letting him go naturally seemed very surprising to her.  Code Status/Advance Care Planning:  Limited code    Symptom Management:   Per primary team  Psycho-social/Spiritual:   Desire for further Chaplaincy support:yes  Additional Recommendations: Caregiving  Support/Resources  Prognosis:   Unable to determine.  Likely hours to days if he does not become more responsive in the next 24-48 hours.   Discharge Planning: Anticipated Hospital Death vs Hospice House      Primary Diagnoses: Present on  Admission: . Anemia . Hypokalemia . Cardiac arrest [facility])   I have reviewed the medical record, interviewed the patient and family, and examined the patient. The following aspects are pertinent.  Past Medical History:  Diagnosis Date  . Anemia   . Coronary artery disease   . Cough   . Diabetes mellitus    25 years ago  . Dizziness   . End stage renal disease ([place])    esrd  . Gastritis   . Hypertension   . Insomnia   . Myocardial infarction   . Occult blood in stools   . Shortness of breath   . Weakness generalized    Social History   Social History  . Marital status: Married    Spouse name: N/A  . Number of children: N/A  . Years of education: N/A   Occupational History  . retired    Social History Main Topics  . Smoking status: Former Smoker    Quit date: 1967  . Smokeless tobacco: Former Systems developer  . Alcohol use No  . Drug use: No  . Sexual activity: Not Asked   Other Topics Concern  . None   Social History Narrative  . None   Family History    Problem Relation Age of Onset  . Family history unknown: Yes   Scheduled Meds: . aspirin  325 mg Per NG tube Daily  . calcitRIOL  1.3 mcg Intravenous Q M,W,F-HD  . chlorhexidine gluconate (MEDLINE KIT)  15 mL Mouth Rinse BID  . darbepoetin (ARANESP) injection - DIALYSIS  60 mcg Intravenous Q Wed-HD  . dextrose  50 mL Intravenous Once  . feeding supplement (NEPRO CARB STEADY)  1,000 mL Per Tube Q24H  . feeding supplement (PRO-STAT SUGAR FREE 64)  30 mL Per Tube TID  . heparin  5,000 Units Subcutaneous Q8H  . mouth rinse  15 mL Mouth Rinse 10 times per day  . pantoprazole sodium  40 mg Per Tube Daily  . sodium chloride flush  10-40 mL Intracatheter Q12H   Continuous Infusions: . sodium chloride 10 mL/hr at 11/24/16 0402  . dextrose 40 mL/hr at 11/24/16 0839  . norepinephrine (LEVOPHED) Adult infusion 22 mcg/min (11/25/16 0932)  . vasopressin (PITRESSIN) infusion - *FOR SHOCK* 0.03 Units/min (11/25/16 0730)   PRN Meds:.fentaNYL (SUBLIMAZE) injection, sodium chloride flush Allergies  Allergen Reactions  . Lisinopril Other (See Comments)    Noted to be allergic on paperwork from diaylsis  . Tape Itching and Rash    Please use "paper" tape only.   Review of Systems  Unable to perform ROS  Patient is not responsive or able to speak.  Physical Exam  Constitutional:  Elderly, frail appearing male.  Intubated.  Receiving HD in ICU  HENT:  Head: Normocephalic and atraumatic.  Eyes: No scleral icterus.  Does not open eyelids.  Cardiovascular: Normal rate.  Exam reveals no gallop and no friction rub.   Pulmonary/Chest: No respiratory distress. He has no wheezes.  intubated  Abdominal: Soft. He exhibits no distension.  Neurological:  Non responsive, does not track with eyes or speak.  Skin: Skin is warm and dry.  Psychiatric:  Unable to assess.    Vital Signs: BP (!) 135/42   Pulse 89   Temp 99.5 F (37.5 C) (Core (Comment))   Resp (!) 21   Ht _0  (1.778 m)   Wt 83.6  kg (184 lb 5.9 oz)   SpO2 96%   BMI 26.45 kg/m  Pain Assessment: CPOT  SpO2: SpO2: 96 % O2 Device:SpO2: 96 % O2 Flow Rate: .   IO: Intake/output summary:  Intake/Output Summary (Last 24 hours) at 11/25/16 0954 Last data filed at 11/25/16 0915  Gross per 24 hour  Intake          2566.85 ml  Output              200 ml  Net          2366.85 ml    LBM: Last BM Date: 11/01/2016 (11/11/2016) Baseline Weight: Weight: 77 kg (169 lb 12.1 oz) Most recent weight: Weight: 83.6 kg (184 lb 5.9 oz)     Palliative Assessment/Data:   Flowsheet Rows   Flowsheet Row Most Recent Value  Intake Tab  Referral Department  Critical care  Unit at Time of Referral  ICU  Palliative Care Primary Diagnosis  Neurology  Date Notified  11/25/16  Palliative Care Type  New Palliative care  Reason for referral  Clarify Goals of Care  Date of Admission  10/31/2016  Date first seen by Palliative Care  11/25/16  # of days Palliative referral response time  0 Day(s)  # of days IP prior to Palliative referral  3  Clinical Assessment  Palliative Performance Scale Score  10%  Psychosocial & Spiritual Assessment  Palliative Care Outcomes      Time In: 915 Time Out: 1025 Time Total: 70 min Greater than 50%  of this time was spent counseling and coordinating care related to the above assessment and plan.  Signed by: Imogene Burn, PA-C Palliative Medicine Pager: [phone]  Please contact Palliative Medicine Team phone at [phone] for questions and concerns.  For individual provider: See Shea Evans

## 2016-11-25 NOTE — Progress Notes (Signed)
[facility] Progress Note   Subjective: off of cooling protocol and off of sedation, not responding.    Vitals:   11/25/16 0930 11/25/16 0935 11/25/16 0945 11/25/16 1000  BP: (!) 135/42  (!) 131/42 (!) 166/49  Pulse: 89 (!) 129 88 86  Resp:      Temp:      TempSrc:      SpO2:      Weight:      Height:        Inpatient medications: . aspirin  325 mg Per NG tube Daily  . calcitRIOL  1.3 mcg Intravenous Q M,W,F-HD  . chlorhexidine gluconate (MEDLINE KIT)  15 mL Mouth Rinse BID  . darbepoetin (ARANESP) injection - DIALYSIS  60 mcg Intravenous Q Wed-HD  . dextrose  50 mL Intravenous Once  . feeding supplement (NEPRO CARB STEADY)  1,000 mL Per Tube Q24H  . feeding supplement (PRO-STAT SUGAR FREE 64)  30 mL Per Tube TID  . heparin  5,000 Units Subcutaneous Q8H  . mouth rinse  15 mL Mouth Rinse 10 times per day  . pantoprazole sodium  40 mg Per Tube Daily  . sodium chloride flush  10-40 mL Intracatheter Q12H   . sodium chloride 10 mL/hr at 11/24/16 0402  . dextrose 40 mL/hr at 11/24/16 0839  . norepinephrine (LEVOPHED) Adult infusion 22 mcg/min (11/25/16 0932)  . vasopressin (PITRESSIN) infusion - *FOR SHOCK* 0.03 Units/min (11/25/16 0730)   fentaNYL (SUBLIMAZE) injection, sodium chloride flush  Exam: General: on vent, not responsive, negative Doll's eyes HEENT: intubated/oral ET tub Lungs:  clear bilat ant/ lat Heart: RRR  Abdomen:  soft dim BS, right fem central line Ext: mild LE/ UE edema Neuro:  unresponsive Dialysis Access: left upper AVF + bruit  Dialysis Orders: MWF [facility] 3.75 hours 500/800 EDW 77 2 K 2 Ca left upper AVF heparin 2000 Mircera 75 q 2 weeks-lst 11/15 calcitriol 1.25 - iPTH 190 - Ca usually 9s and P in goal  Assessment: 1. SP cardiac arrest- V fib arrest 2. VDRF - per CCM 3. AMS - may have anoxic brain damage, not waking up 4. ESRD- usual HD MWF 5. Volume - up 5-6kg by wts 6. Hypotension - pressor dependent 7. Anemia  - hgb 8.7 post  HD up to 10.9 without transfusion - last outpt Hgb was 10.6 11/15 with 31% sat; redose next tmt at 60  8. Metabolic bone disease -  Continue calcitriol - covert to IV 9. Nutrition - feedings not initiated yet 10. EOL - made limited code today. Quality of life was poor in OP setting per pt's primary nephrologist, Dr Deterding.   Plan - HD today, gentle Rx, minimal UF    Kelly Splinter MD [place] pager [phone]   11/25/2016, 10:13 AM    Recent Labs Lab 11/24/16 0400 11/24/16 0736 11/24/16 1146 11/24/16 1550 11/25/16 0348  NA 136 137 136  --  133*  K 3.4* 3.8 4.3  --  5.2*  CL 102 103 101  --  100*  CO2 25 25 25   --  24  GLUCOSE 69 74 68  --  102*  BUN 6 8 9   --  9  CREATININE 4.80* 5.13* 5.19*  --  5.97*  CALCIUM 7.5* 7.4* 7.2*  --  7.1*  PHOS 4.0  --   --  4.8* 5.0*   No results for input(s): AST, ALT, ALKPHOS, BILITOT, PROT, ALBUMIN in the last 168 hours.  Recent Labs Lab 11/23/16  0400  11/23/16 1601 11/24/16 0400 11/25/16 0348  WBC 5.0  --   --  12.7* 20.4*  HGB 8.6*  < > 11.6* 10.4* 9.7*  HCT 25.5*  < > 34.0* 32.2* 30.4*  MCV 82.8  --   --  86.6 87.6  PLT 114*  --   --  136* 100*  < > = values in this interval not displayed. Iron/TIBC/Ferritin/ %Sat No results found for: IRON, TIBC, FERRITIN, IRONPCTSAT

## 2016-11-25 NOTE — Procedures (Signed)
Pt transported to CT on vent, no complications  

## 2016-11-25 NOTE — Progress Notes (Signed)
Patient Name: Ronald Brewer Date of Encounter: 11/25/2016  Primary Cardiologist: Dr [facility] Problem List     Active Problems:   ESRD on dialysis [facility])   Anemia   Hypokalemia   Cardiac arrest with ventricular fibrillation (HCC)   Prolonged QT interval   Cardiac arrest (HCC)   Acute respiratory failure (HCC)     Subjective   Intubated and sedated  Inpatient Medications    Scheduled Meds: . aspirin  325 mg Per NG tube Daily  . calcitRIOL  1.3 mcg Intravenous Q M,W,F-HD  . chlorhexidine gluconate (MEDLINE KIT)  15 mL Mouth Rinse BID  . darbepoetin (ARANESP) injection - DIALYSIS  60 mcg Intravenous Q Wed-HD  . dextrose  50 mL Intravenous Once  . feeding supplement (NEPRO CARB STEADY)  1,000 mL Per Tube Q24H  . feeding supplement (PRO-STAT SUGAR FREE 64)  30 mL Per Tube TID  . heparin  5,000 Units Subcutaneous Q8H  . mouth rinse  15 mL Mouth Rinse 10 times per day  . pantoprazole sodium  40 mg Per Tube Daily  . sodium chloride flush  10-40 mL Intracatheter Q12H   Continuous Infusions: . sodium chloride 10 mL/hr at 11/24/16 0402  . dextrose 40 mL/hr at 11/24/16 0839  . fentaNYL infusion INTRAVENOUS Stopped (11/24/16 0920)  . norepinephrine (LEVOPHED) Adult infusion 20 mcg/min (11/25/16 0655)  . vasopressin (PITRESSIN) infusion - *FOR SHOCK* Stopped (11/25/16 0554)   PRN Meds: fentaNYL, sodium chloride flush   Vital Signs    Vitals:   11/25/16 0700 11/25/16 0719 11/25/16 0730 11/25/16 0745  BP:  (!) 149/45 (!) 133/42 (!) 122/40  Pulse: 86 88 86 89  Resp: 19     Temp: 98.8 F (37.1 C)     TempSrc:      SpO2: 100%     Weight:      Height:        Intake/Output Summary (Last 24 hours) at 11/25/16 0750 Last data filed at 11/25/16 0700  Gross per 24 hour  Intake          2427.64 ml  Output              600 ml  Net          1827.64 ml   Filed Weights   11/23/16 0500 11/24/16 0600 11/25/16 0500  Weight: 172 lb 9.9 oz (78.3 kg) 180 lb 2.9 oz (81.7  kg) 184 lb 5.9 oz (83.6 kg)    Physical Exam   GEN: Well nourished, well developed, intubated HEENT: Grossly normal.  Neck: Supple Cardiac: RRR Respiratory:  Rhonchi bilaterally  GI: Soft, nontender, nondistended MS: no deformity or atrophy. Skin: warm and dry, no rash. Neuro:  Intubated; unresponsive  Labs    CBC  Recent Labs  11/24/16 0400 11/25/16 0348  WBC 12.7* 20.4*  HGB 10.4* 9.7*  HCT 32.2* 30.4*  MCV 86.6 87.6  PLT 136* 948*   Basic Metabolic Panel  Recent Labs  11/24/16 1146 11/24/16 1550 11/25/16 0348  NA 136  --  133*  K 4.3  --  5.2*  CL 101  --  100*  CO2 25  --  24  GLUCOSE 68  --  102*  BUN 9  --  9  CREATININE 5.19*  --  5.97*  CALCIUM 7.2*  --  7.1*  MG  --  1.7 1.7  PHOS  --  4.8* 5.0*   Cardiac Enzymes  Recent Labs  11/01/2016 2040 11/23/16  0100 11/23/16 0605  TROPONINI 0.23* 0.70* 1.04*     Telemetry    Sinus with PVCs - Personally Reviewed   Radiology    Dg Chest Port 1 View  Result Date: 11/25/2016 CLINICAL DATA:  Acute onset of respiratory failure. Shortness of breath. Subsequent encounter. EXAM: PORTABLE CHEST 1 VIEW COMPARISON:  Chest radiograph performed 11/24/2016 FINDINGS: The patient's endotracheal tube is seen ending 3 cm above the carina. An enteric tube is noted extending below the diaphragm. External pacing pads are noted. Small bilateral pleural effusions are noted, more prominent than on the prior study. Vascular congestion is seen. Bilateral central airspace opacities could reflect mild interstitial edema. No pneumothorax is seen. The cardiomediastinal silhouette is enlarged. No acute osseous abnormalities are identified. IMPRESSION: 1. Endotracheal tube seen ending 3 cm above the carina. 2. Small bilateral pleural effusions, more prominent than on the prior study. Vascular congestion and cardiomegaly. Bilateral central airspace opacities could reflect mild interstitial edema. Electronically Signed   By: Garald Balding M.D.   On: 11/25/2016 06:23   Dg Chest Port 1 View  Result Date: 11/24/2016 CLINICAL DATA:  Acute respiratory failure. EXAM: PORTABLE CHEST 1 VIEW COMPARISON:  Radiograph of November 22, 2016. FINDINGS: Stable cardiomediastinal silhouette. No pneumothorax is noted. Nasogastric tube tube tip is now seen in proximal stomach. Minimal left basilar subsegmental atelectasis is noted. Elevated right hemidiaphragm is now noted with minimal subsegmental atelectasis. IMPRESSION: Endotracheal nasogastric tubes in good position. Stable minimal left basilar subsegmental atelectasis. Interval development of elevated right hemidiaphragm with minimal right basilar subsegmental atelectasis. Continued radiographic follow-up is recommended. Electronically Signed   By: Marijo Conception, M.D.   On: 11/24/2016 07:35     Patient Profile     75 year old male with past medical history of end-stage renal disease dialysis dependent, diabetes mellitus, hypertension admitted following cardiac arrest post dialysis. Initial electrocardiogram showed markedly prolonged QT interval. Patient was on sertraline and potassium 3 at time of admission. He was intubated and cooled.  Assessment & Plan    1 status post cardiac arrest-patient arrested following dialysis. His electrocardiogram showed markedly prolonged QT interval. He was also having frequent ectopy. I am concerned that patient had torsades related to his long QT interval. His potassium was 3.0 which could have contributed. We discontinued sertraline. Note reviewing prior ECGs, QT is chronically prolonged. Troponin 1.04 likely related to cardiac arrest and CPR; however will need cath if he recovers (at risk for anoxic encephalopathy). Follow QT interval. If he recovers and event is deemed not ischemia mediated and QT remains long he may require subcutaneous defibrillator.   2 Cardiomyopathy-etiology unclear; will add beta blocker and ACEI later if BP allows (presently on  pressors); will ultimately need cath if his neurological status improves.  3 VDRF-critical care medicine will be managing.  4 end-stage renal disease-patient will need nephrology consult and dialysis as needed.   Signed, Kirk Ruths, MD  11/25/2016, 7:50 AM

## 2016-11-25 NOTE — Progress Notes (Signed)
Telephone consent for hemodialysis was given by patient's wife Rev Jonathanmichael Pasos at [phone] via telephone. Two nurses Epimenio Sarin, RN and Beatrix Shipper, RN) verified phone consent.

## 2016-11-25 NOTE — Progress Notes (Addendum)
PULMONARY / CRITICAL CARE MEDICINE   Name: Ronald Brewer MRN: [id number] DOB: 03/02/1941    ADMISSION DATE:  11/23/2016 CONSULTATION DATE:  11/25/2016   CHIEF COMPLAINT:  Cardiac arrest  HISTORY OF PRESENT ILLNESS:   75 year old man with ESRD on hemodialysis and CAD was brought in by EMS after a witnessed cardiac arrest at dialysis 11/27. He finished his treatment, then had a syncopal episode and vomited, again syncope with loss of pulse-shock advised the AED. After EMS arrival, noted to be in V. fib given epinephrine 1 with ROSC, total CPR time about 15 minutes. He is a diabetic hypertensive neck: 2011 shows normal ejection fraction He was admitted 10/2016 for generalized weakness, black stools and hemoglobin about 9 attributed to anemia of chronic disease He remained unresponsive after arrival to the emergency room had nonpurposeful movement of both upper extremities EKG showed prolonged QT, frequent PVCs   SUBJECTIVE:  Remains comatose, critically ill,  Being dialysed On levophed gtt@ 25 mcg + vaso  VITAL SIGNS: BP (!) 156/47   Pulse 92   Temp 99.1 F (37.3 C)   Resp 14   Ht 5\' 10"  (1.778 m)   Wt 184 lb 5.9 oz (83.6 kg)   SpO2 97%   BMI 26.45 kg/m   HEMODYNAMICS: CVP:  [10 mmHg-13 mmHg] 12 mmHg  VENTILATOR SETTINGS: Vent Mode: PRVC FiO2 (%):  [40 %-70 %] 50 % Set Rate:  [10 bmp-15 bmp] 15 bmp Vt Set:  [450 mL] 450 mL PEEP:  [5 cmH20] 5 cmH20 Plateau Pressure:  [18 cmH20-20 cmH20] 18 cmH20  INTAKE / OUTPUT: I/O last 3 completed shifts: In: 3333.4 [I.V.:2836.9; NG/GT:496.5] Out: 600 [Emesis/NG output:600]  PHYSICAL EXAMINATION:   General:  chronically ill-appearing elderly  intubated ,  Neuro:  Unresponsive to DPS, GCS 3 HEENT:  Oral ET tube, pupils 3 mm bilaterally equally reactive to light  S1-S2 normal, no rub, no murmur Decreased breath sounds bilateral, no rhonchi   soft and nontender abdomen Extremities-left AV fistula with thrill, right femoral  CVL   LABS:  BMET  Recent Labs Lab 11/24/16 0736 11/24/16 1146 11/25/16 0348  NA 137 136 133*  K 3.8 4.3 5.2*  CL 103 101 100*  CO2 25 25 24   BUN 8 9 9   CREATININE 5.13* 5.19* 5.97*  GLUCOSE 74 68 102*    Electrolytes  Recent Labs Lab 11/24/16 0400 11/24/16 0736 11/24/16 1146 11/24/16 1550 11/25/16 0348  CALCIUM 7.5* 7.4* 7.2*  --  7.1*  MG 1.8  --   --  1.7 1.7  PHOS 4.0  --   --  4.8* 5.0*    CBC  Recent Labs Lab 11/23/16 0400  11/23/16 1601 11/24/16 0400 11/25/16 0348  WBC 5.0  --   --  12.7* 20.4*  HGB 8.6*  < > 11.6* 10.4* 9.7*  HCT 25.5*  < > 34.0* 32.2* 30.4*  PLT 114*  --   --  136* 100*  < > = values in this interval not displayed.  Coag's  Recent Labs Lab 10/28/2016 1722 11/23/16 0205  APTT 40* 41*  INR 1.35 1.47    Sepsis Markers  Recent Labs Lab 11/25/2016 1732  LATICACIDVEN 6.09*    ABG  Recent Labs Lab 11/23/16 0338 11/23/16 0646 11/24/16 1718  PHART 7.621* 7.374 7.269*  PCO2ART 21.0* 44.2 57.0*  PO2ART 269* 115* 198*    Liver Enzymes No results for input(s): AST, ALT, ALKPHOS, BILITOT, ALBUMIN in the last 168 hours.  Cardiac Enzymes  Recent  Labs Lab 10/27/2016 2040 11/23/16 0100 11/23/16 0605  TROPONINI 0.23* 0.70* 1.04*    Glucose  Recent Labs Lab 11/24/16 1130 11/24/16 1553 11/24/16 2017 11/25/16 0018 11/25/16 0351 11/25/16 0842  GLUCAP 68 78 91 96 110* 103*    Imaging Dg Chest Port 1 View  Result Date: 11/25/2016 CLINICAL DATA:  Acute onset of respiratory failure. Shortness of breath. Subsequent encounter. EXAM: PORTABLE CHEST 1 VIEW COMPARISON:  Chest radiograph performed 11/24/2016 FINDINGS: The patient's endotracheal tube is seen ending 3 cm above the carina. An enteric tube is noted extending below the diaphragm. External pacing pads are noted. Small bilateral pleural effusions are noted, more prominent than on the prior study. Vascular congestion is seen. Bilateral central airspace opacities  could reflect mild interstitial edema. No pneumothorax is seen. The cardiomediastinal silhouette is enlarged. No acute osseous abnormalities are identified. IMPRESSION: 1. Endotracheal tube seen ending 3 cm above the carina. 2. Small bilateral pleural effusions, more prominent than on the prior study. Vascular congestion and cardiomegaly. Bilateral central airspace opacities could reflect mild interstitial edema. Electronically Signed   By: Garald Balding M.D.   On: 11/25/2016 06:23     STUDIES:  Echo 11/28>> EF 20%, WMA +, gr 2 DD, RVSP 62  CULTURES:   ANTIBIOTICS:   SIGNIFICANT EVENTS: 11/27 admit  LINES/TUBES: 11/27 rt fem CVL >> 11/27 ETT >>  DISCUSSION:  Cardiac arrest due to ventricular fibrillation in the setting of prolonged QT this may be due to postdialysis shift electrolytes , medication such as Zoloft, intrinsic cardiac disease, acute ischemia less likely   PULMONARY A:  Acute respiratory failure P:   Prvc, vent settings were reviewed and adjusted  Increase respiratory rate at 15  CARDIOVASCULAR A:   ventricular fibrillation Prolonged QT -610 11/28 Cardiogenic shock P:  Cardiology following Follow QT daily- remains prolonged Replete electrolytes May need defibrillator if he is neuro intact Aim for SBP 110  RENAL A:    hypokalemia hypophos ESRD P:   HD today, try to keep even  GI A:    no issues P:   Npo protonix  Ct TFs  Heme A:    anemia of chronic disease P:  Follow  ID A:    no issues P:   Concern for aspiration but hold off antibiotics for now   EndoCRINE A:   DM-2   P:   SSI -mod   NEUROLOGIC A:   Anoxic encephalopathy  P:   RASS goal:0  Off sedation for neuro checks Head CT today   FAMILY  - Updates:   Wife 11/29 _ limited code issued, she hints that he would want to stop HD & not be on prolonged life support, will involve palliative care  - Inter-disciplinary family meet or Palliative Care meeting due by:   day 7    SUmmary -  Rewarmed, profound shock,at risk for further arrhythmias due to prolonged QT, not waking up - fear poor neuro prognosis   The patient is critically ill with multiple organ systems failure and requires high complexity decision making for assessment and support, frequent evaluation and titration of therapies, application of advanced monitoring technologies and extensive interpretation of multiple databases. Critical Care Time devoted to patient care services described in this note independent of APP time is 35 minutes.    Kara Mead MD. [place] Flood. [place] [facility] Pager [phone] If no response call [phone]    11/25/2016, 9:02 AM

## 2016-11-26 ENCOUNTER — Inpatient Hospital Stay (HOSPITAL_COMMUNITY): Payer: Medicare Other

## 2016-11-26 LAB — BASIC METABOLIC PANEL
ANION GAP: 11 (ref 5–15)
BUN: 14 mg/dL (ref 6–20)
CALCIUM: 7.1 mg/dL — AB (ref 8.9–10.3)
CO2: 25 mmol/L (ref 22–32)
Chloride: 95 mmol/L — ABNORMAL LOW (ref 101–111)
Creatinine, Ser: 4.49 mg/dL — ABNORMAL HIGH (ref 0.61–1.24)
GFR calc Af Amer: 14 mL/min — ABNORMAL LOW (ref >60)
GFR, EST NON AFRICAN AMERICAN: 12 mL/min — AB (ref >60)
GLUCOSE: 106 mg/dL — AB (ref 65–99)
POTASSIUM: 3.7 mmol/L (ref 3.5–5.1)
SODIUM: 131 mmol/L — AB (ref 135–145)

## 2016-11-26 LAB — GLUCOSE, CAPILLARY
GLUCOSE-CAPILLARY: 105 mg/dL — AB (ref 65–99)
GLUCOSE-CAPILLARY: 119 mg/dL — AB (ref 65–99)
GLUCOSE-CAPILLARY: 123 mg/dL — AB (ref 65–99)
GLUCOSE-CAPILLARY: 89 mg/dL (ref 65–99)
GLUCOSE-CAPILLARY: 102 mg/dL — AB (ref 65–99)
Glucose-Capillary: 94 mg/dL (ref 65–99)
Glucose-Capillary: 98 mg/dL (ref 65–99)

## 2016-11-26 LAB — CBC
HCT: 24.2 % — ABNORMAL LOW (ref 39.0–52.0)
Hemoglobin: 7.9 g/dL — ABNORMAL LOW (ref 13.0–17.0)
MCH: 28.1 pg (ref 26.0–34.0)
MCHC: 32.6 g/dL (ref 30.0–36.0)
MCV: 86.1 fL (ref 78.0–100.0)
PLATELETS: 84 10*3/uL — AB (ref 150–400)
RBC: 2.81 MIL/uL — AB (ref 4.22–5.81)
RDW: 15 % (ref 11.5–15.5)
WBC: 15.4 10*3/uL — AB (ref 4.0–10.5)

## 2016-11-26 LAB — PHOSPHORUS: PHOSPHORUS: 2.5 mg/dL (ref 2.5–4.6)

## 2016-11-26 LAB — MAGNESIUM: MAGNESIUM: 1.6 mg/dL — AB (ref 1.7–2.4)

## 2016-11-26 NOTE — Progress Notes (Signed)
PULMONARY / CRITICAL CARE MEDICINE   Name: Ronald Brewer MRN: [id number] DOB: [birth year]    ADMISSION DATE:  10/31/2016 CONSULTATION DATE:  11/26/2016   CHIEF COMPLAINT:  Cardiac arrest  HISTORY OF PRESENT ILLNESS:   75 year old man with ESRD on hemodialysis and CAD was brought in by EMS after a witnessed cardiac arrest at dialysis 11/27. He finished his treatment, then had a syncopal episode and vomited, again syncope with loss of pulse-shock advised the AED. After EMS arrival, noted to be in V. fib given epinephrine 1 with ROSC, total CPR time about 15 minutes. He is a diabetic hypertensive neck: 2011 shows normal ejection fraction He was admitted 10/2016 for generalized weakness, black stools and hemoglobin about 9 attributed to anemia of chronic disease He remained unresponsive after arrival to the emergency room had nonpurposeful movement of both upper extremities EKG showed prolonged QT, frequent PVCs   SUBJECTIVE:  Remains comatose, critically ill Off levophed gtt On vaso - DBP low, RN aiming for MAP  VITAL SIGNS: BP (!) 173/49   Pulse 78   Temp 99 F (37.2 C)   Resp 15   Ht 5\' 10"  (1.778 m)   Wt 197 lb 15.6 oz (89.8 kg)   SpO2 100%   BMI 28.41 kg/m   HEMODYNAMICS: CVP:  [8 mmHg-14 mmHg] 14 mmHg  VENTILATOR SETTINGS: Vent Mode: PRVC FiO2 (%):  [40 %-50 %] 40 % Set Rate:  [15 bmp] 15 bmp Vt Set:  [450 mL] 450 mL PEEP:  [5 cmH20] 5 cmH20 Plateau Pressure:  [20 cmH20-22 cmH20] 20 cmH20  INTAKE / OUTPUT: I/O last 3 completed shifts: In: 3720.8 [I.V.:2555.8; NG/GT:1165] Out: [id number] [Other:677; Stool:400]  PHYSICAL EXAMINATION:   General:  chronically ill-appearing elderly  intubated ,  Neuro:  Unresponsive to DPS, GCS 3 HEENT:  Oral ET tube, pupils 3 mm bilaterally equally reactive to light  S1-S2 normal, no rub, no murmur Decreased breath sounds bilateral, no rhonchi   soft and nontender abdomen Extremities-left AV fistula with thrill, right femoral  CVL   LABS:  BMET  Recent Labs Lab 11/25/16 1200 11/25/16 1750 11/26/16 0515  NA 133* 131* 131*  K 3.8 3.5 3.7  CL 97* 97* 95*  CO2 26 25 25   BUN 7 9 14   CREATININE 3.86* 3.95* 4.49*  GLUCOSE 111* 136* 106*    Electrolytes  Recent Labs Lab 11/25/16 0348 11/25/16 1200 11/25/16 1750 11/26/16 0515  CALCIUM 7.1* 7.0* 7.0* 7.1*  MG 1.7  --  1.6* 1.6*  PHOS 5.0* 2.9 2.8 2.5    CBC  Recent Labs Lab 11/25/16 0348 11/25/16 1200 11/26/16 0515  WBC 20.4* 16.6* 15.4*  HGB 9.7* 8.8* 7.9*  HCT 30.4* 27.1* 24.2*  PLT 100* 77* 84*    Coag's  Recent Labs Lab 11/20/2016 1722 11/23/16 0205  APTT 40* 41*  INR 1.35 1.47    Sepsis Markers  Recent Labs Lab 11/03/2016 1732  LATICACIDVEN 6.09*    ABG  Recent Labs Lab 11/23/16 0338 11/23/16 0646 11/24/16 1718  PHART 7.621* 7.374 7.269*  PCO2ART 21.0* 44.2 57.0*  PO2ART 269* 115* 198*    Liver Enzymes  Recent Labs Lab 11/25/16 1200  ALBUMIN 1.6*    Cardiac Enzymes  Recent Labs Lab 11/24/2016 2040 11/23/16 0100 11/23/16 0605  TROPONINI 0.23* 0.70* 1.04*    Glucose  Recent Labs Lab 11/25/16 0351 11/25/16 0842 11/25/16 1639 11/25/16 2025 11/26/16 0534 11/26/16 0759  GLUCAP 110* 103* 122* 105* 119* 123*    Imaging Ct  Head Wo Contrast  Result Date: 11/25/2016 CLINICAL DATA:  Post cardiac arrest, question anoxic encephalopathy EXAM: CT HEAD WITHOUT CONTRAST TECHNIQUE: Contiguous axial images were obtained from the base of the skull through the vertex without intravenous contrast. COMPARISON:  MR brain 08/22/2015 FINDINGS: Brain: Generalized atrophy. Normal ventricular morphology. No midline shift or mass effect. Mild small vessel chronic ischemic changes of deep cerebral white matter. No intracranial hemorrhage, mass lesion, evidence of acute infarction, or extra-axial fluid collections. No definite brain edema or sulcal effacement identified. Vascular: Atherosclerotic calcifications at carotid  siphons Skull: Intact Sinuses/Orbits: Mucosal thickening in the maxillary sinuses Other: N/A IMPRESSION: Generalized atrophy with mild small vessel chronic ischemic changes of deep cerebral white matter. No acute intracranial abnormalities. Electronically Signed   By: Lavonia Dana M.D.   On: 11/25/2016 13:11   Dg Chest Port 1 View  Result Date: 11/26/2016 CLINICAL DATA:  Acute respiratory failure. EXAM: PORTABLE CHEST 1 VIEW COMPARISON:  11/25/2016. FINDINGS: Cardiomegaly. ET tube has been withdrawn slightly, now 4.9 cm above carina. BILATERAL pleural effusions. Central airspace opacities most consistent with edema. No pneumothorax. No osseous findings. IMPRESSION: Cardiomegaly.  Mild edema is suspected.  Essentially stable chest. Electronically Signed   By: Staci Righter M.D.   On: 11/26/2016 06:46     STUDIES:  Echo 11/28>> EF 20%, WMA +, gr 2 DD, RVSP 62  CULTURES:   ANTIBIOTICS:   SIGNIFICANT EVENTS: 11/27 admit  LINES/TUBES: 11/27 rt fem CVL >> 11/27 ETT >>  DISCUSSION:  Cardiac arrest due to ventricular fibrillation in the setting of prolonged QT this may be due to postdialysis shift electrolytes , medication such as Zoloft, intrinsic cardiac disease, acute ischemia unlikely   PULMONARY A:  Acute respiratory failure P:   Prvc, vent settings were reviewed and adjusted  Ct  respiratory rate at 15  CARDIOVASCULAR A:   ventricular fibrillation Prolonged QT -610 11/28 Cardiogenic shock P:  Cardiology following Follow QT daily- remains prolonged Replete electrolytes May need defibrillator if he is neuro intact Aim for SBP 110- can dc vaso, do not restart pressors  RENAL A:    hypokalemia hypophos ESRD P:   HD 11/30  GI A:    no issues P:   Npo protonix  Ct TFs  Heme A:    anemia of chronic disease P:  Follow  ID A:    no issues P:   Concern for aspiration but hold off antibiotics for now   EndoCRINE A:   DM-2   P:   SSI -mod    NEUROLOGIC A:   Anoxic encephalopathy  Head CT 11/30 non diagnostic P:   RASS goal:0  Off sedation for neuro checks    FAMILY  - Updates:   Wife 11/29 _ limited code issued, she hints that he would want to stop HD & not be on prolonged life support, will involve palliative care  - Inter-disciplinary family meet or Palliative Care meeting due by:  day 7    SUmmary -  Rewarmed, shock resolved ,at risk for further arrhythmias due to prolonged QT, not waking up - fear poor neuro prognosis   The patient is critically ill with multiple organ systems failure and requires high complexity decision making for assessment and support, frequent evaluation and titration of therapies, application of advanced monitoring technologies and extensive interpretation of multiple databases. Critical Care Time devoted to patient care services described in this note independent of APP time is 32 minutes.    Kara Mead MD.  FCCP. [facility] Pager [phone] If no response call [phone]    11/26/2016, 10:05 AM

## 2016-11-26 NOTE — Progress Notes (Signed)
Pt. Wife visited today about 3:30.  Was verbally aggressive towards nurse and secretary stating she did not care about our units policies and demanding nurse to leave the room and shut the door.  I told her I could not close the door because I had to be able to hear if any alarms went off.  She also stated that if the palliative MD tried to "unplug her husband she would sue her tail off."  I told her palliative was just wanting to talk with her about her wishes for her husband but she was still very upset.  Patient has also said her husband has been wanting to die and she doesn't know why they shocked him or are continuing to do dialysis.  She is continually contradicting herself in conversations with caregivers about her wishes for her husbands care.

## 2016-11-26 NOTE — Progress Notes (Signed)
Patient Name: Ronald Brewer Date of Encounter: 11/26/2016  Primary Cardiologist: Dr [facility] Problem List     Active Problems:   ESRD on dialysis [facility])   Anemia   Hypokalemia   Cardiac arrest with ventricular fibrillation (HCC)   Prolonged QT interval   Cardiac arrest (HCC)   Acute respiratory failure (HCC)   Anoxic encephalopathy (HCC)   Goals of care, counseling/discussion   Palliative care encounter     Subjective   Intubated; no response to verbal stimuli.  Inpatient Medications    Scheduled Meds: . aspirin  325 mg Per NG tube Daily  . calcitRIOL  1.3 mcg Intravenous Q M,W,F-HD  . chlorhexidine gluconate (MEDLINE KIT)  15 mL Mouth Rinse BID  . darbepoetin (ARANESP) injection - DIALYSIS  60 mcg Intravenous Q Wed-HD  . dextrose  50 mL Intravenous Once  . feeding supplement (NEPRO CARB STEADY)  1,000 mL Per Tube Q24H  . feeding supplement (PRO-STAT SUGAR FREE 64)  30 mL Per Tube TID  . heparin  5,000 Units Subcutaneous Q8H  . mouth rinse  15 mL Mouth Rinse 10 times per day  . pantoprazole sodium  40 mg Per Tube Daily  . sodium chloride flush  10-40 mL Intracatheter Q12H   Continuous Infusions: . sodium chloride 10 mL/hr at 11/25/16 1900  . dextrose 40 mL/hr at 11/25/16 1900  . norepinephrine (LEVOPHED) Adult infusion Stopped (11/25/16 2310)  . vasopressin (PITRESSIN) infusion - *FOR SHOCK* 0.03 Units/min (11/25/16 1900)   PRN Meds: sodium chloride, sodium chloride, alteplase, fentaNYL (SUBLIMAZE) injection, heparin, heparin, lidocaine (PF), lidocaine-prilocaine, pentafluoroprop-tetrafluoroeth, sodium chloride flush   Vital Signs    Vitals:   11/26/16 0400 11/26/16 0500 11/26/16 0600 11/26/16 0700  BP:      Pulse: 75 75 76 79  Resp: 16 16 16 15   Temp:      TempSrc:      SpO2: 100% 100% 100% 100%  Weight:  197 lb 15.6 oz (89.8 kg)    Height:        Intake/Output Summary (Last 24 hours) at 11/26/16 0727 Last data filed at 11/26/16 0600  Gross per 24 hour  Intake          2379.77 ml  Output             1077 ml  Net          1302.77 ml   Filed Weights   11/25/16 0500 11/25/16 1035 11/26/16 0500  Weight: 184 lb 5.9 oz (83.6 kg) 183 lb 3.2 oz (83.1 kg) 197 lb 15.6 oz (89.8 kg)    Physical Exam   GEN: Well nourished, well developed, intubated HEENT: Grossly normal.  Neck: Supple Cardiac: RRR Respiratory:  CTA anteriorly GI: Soft, nondistended MS: no deformity or atrophy. Skin: warm and dry, no rash. Neuro:  Intubated; unresponsive  Labs    CBC  Recent Labs  11/25/16 1200 11/26/16 0515  WBC 16.6* 15.4*  HGB 8.8* 7.9*  HCT 27.1* 24.2*  MCV 87.1 86.1  PLT 77* PENDING   Basic Metabolic Panel  Recent Labs  11/25/16 1750 11/26/16 0515  NA 131* 131*  K 3.5 3.7  CL 97* 95*  CO2 25 25  GLUCOSE 136* 106*  BUN 9 14  CREATININE 3.95* 4.49*  CALCIUM 7.0* 7.1*  MG 1.6* 1.6*  PHOS 2.8 2.5     Telemetry    Sinus with PVCs and brief PAT - Personally Reviewed   Radiology    Ct  Head Wo Contrast  Result Date: 11/25/2016 CLINICAL DATA:  Post cardiac arrest, question anoxic encephalopathy EXAM: CT HEAD WITHOUT CONTRAST TECHNIQUE: Contiguous axial images were obtained from the base of the skull through the vertex without intravenous contrast. COMPARISON:  MR brain 08/22/2015 FINDINGS: Brain: Generalized atrophy. Normal ventricular morphology. No midline shift or mass effect. Mild small vessel chronic ischemic changes of deep cerebral white matter. No intracranial hemorrhage, mass lesion, evidence of acute infarction, or extra-axial fluid collections. No definite brain edema or sulcal effacement identified. Vascular: Atherosclerotic calcifications at carotid siphons Skull: Intact Sinuses/Orbits: Mucosal thickening in the maxillary sinuses Other: N/A IMPRESSION: Generalized atrophy with mild small vessel chronic ischemic changes of deep cerebral white matter. No acute intracranial abnormalities. Electronically  Signed   By: Lavonia Dana M.D.   On: 11/25/2016 13:11   Dg Chest Port 1 View  Result Date: 11/26/2016 CLINICAL DATA:  Acute respiratory failure. EXAM: PORTABLE CHEST 1 VIEW COMPARISON:  11/25/2016. FINDINGS: Cardiomegaly. ET tube has been withdrawn slightly, now 4.9 cm above carina. BILATERAL pleural effusions. Central airspace opacities most consistent with edema. No pneumothorax. No osseous findings. IMPRESSION: Cardiomegaly.  Mild edema is suspected.  Essentially stable chest. Electronically Signed   By: Staci Righter M.D.   On: 11/26/2016 06:46   Dg Chest Port 1 View  Result Date: 11/25/2016 CLINICAL DATA:  Acute onset of respiratory failure. Shortness of breath. Subsequent encounter. EXAM: PORTABLE CHEST 1 VIEW COMPARISON:  Chest radiograph performed 11/24/2016 FINDINGS: The patient's endotracheal tube is seen ending 3 cm above the carina. An enteric tube is noted extending below the diaphragm. External pacing pads are noted. Small bilateral pleural effusions are noted, more prominent than on the prior study. Vascular congestion is seen. Bilateral central airspace opacities could reflect mild interstitial edema. No pneumothorax is seen. The cardiomediastinal silhouette is enlarged. No acute osseous abnormalities are identified. IMPRESSION: 1. Endotracheal tube seen ending 3 cm above the carina. 2. Small bilateral pleural effusions, more prominent than on the prior study. Vascular congestion and cardiomegaly. Bilateral central airspace opacities could reflect mild interstitial edema. Electronically Signed   By: Garald Balding M.D.   On: 11/25/2016 06:23     Patient Profile     75 year old male with past medical history of end-stage renal disease dialysis dependent, diabetes mellitus, hypertension admitted following cardiac arrest post dialysis. Initial electrocardiogram showed markedly prolonged QT interval. Patient was on sertraline and potassium 3 at time of admission. He was intubated and  cooled. Echo shows severe LV dysfunction.   Assessment & Plan    1 status post cardiac arrest-patient arrested following dialysis. His electrocardiogram showed markedly prolonged QT interval. He was also having frequent ectopy. I am concerned that patient had torsades related to his long QT interval. His potassium was 3.0 which could have contributed. We discontinued sertraline. Note in reviewing prior ECGs, QT is chronically prolonged. Troponin 1.04 likely related to cardiac arrest and CPR; however will need cath if he recovers. Follow QT interval. If he recovers and event is deemed not ischemia mediated and QT remains long he may require subcutaneous defibrillator.   2 Cardiomyopathy-etiology unclear; will add beta blocker and ACEI later if BP allows (presently on pressors); will ultimately need cath if his neurological status improves.  3 VDRF-critical care medicine will be managing.  4 end-stage renal disease-patient will need nephrology consult and dialysis as needed.  5 Probable anoxic encephalopathy-palliative care consulted. Prognosis appears poor.   Signed, Kirk Ruths, MD  11/26/2016, 7:27 AM

## 2016-11-26 NOTE — Progress Notes (Signed)
  [facility] Progress Note   Subjective: off pressors, not waking up  Vitals:   11/26/16 0600 11/26/16 0700 11/26/16 0751 11/26/16 0800  BP:   (!) 173/49   Pulse: 76 79 81 78  Resp: '16 15 15 15  '$ Temp:    99 F (37.2 C)  TempSrc:      SpO2: 100% 100% 100% 100%  Weight:      Height:        Inpatient medications: . aspirin  325 mg Per NG tube Daily  . calcitRIOL  1.3 mcg Intravenous Q M,W,F-HD  . chlorhexidine gluconate (MEDLINE KIT)  15 mL Mouth Rinse BID  . darbepoetin (ARANESP) injection - DIALYSIS  60 mcg Intravenous Q Wed-HD  . dextrose  50 mL Intravenous Once  . feeding supplement (NEPRO CARB STEADY)  1,000 mL Per Tube Q24H  . feeding supplement (PRO-STAT SUGAR FREE 64)  30 mL Per Tube TID  . heparin  5,000 Units Subcutaneous Q8H  . mouth rinse  15 mL Mouth Rinse 10 times per day  . pantoprazole sodium  40 mg Per Tube Daily  . sodium chloride flush  10-40 mL Intracatheter Q12H   . sodium chloride 10 mL/hr at 11/25/16 1900  . dextrose 40 mL/hr at 11/25/16 1900  . norepinephrine (LEVOPHED) Adult infusion Stopped (11/25/16 2310)  . vasopressin (PITRESSIN) infusion - *FOR SHOCK* Stopped (11/26/16 0848)   sodium chloride, sodium chloride, alteplase, fentaNYL (SUBLIMAZE) injection, heparin, heparin, lidocaine (PF), lidocaine-prilocaine, pentafluoroprop-tetrafluoroeth, sodium chloride flush  Exam: General: on vent, not responsive, negative Doll's eyes HEENT: intubated/oral ET tub Lungs:  clear bilat ant/ lat Heart: RRR  Abdomen:  soft dim BS, right fem central line Ext: mild LE/ UE edema Neuro:  unresponsive Dialysis Access: left upper AVF + bruit  Dialysis Orders: MWF [facility]   3h 46mn   77kg  2/2 bath  Hep 2000   LUA AVF Mircera 75 q 2 weeks-lst 11/15  Calcitriol 1.25 - iPTH 190 - Ca usually 9s and P in goal  Assessment: 1. SP cardiac arrest- V fib arrest 2. VDRF - per CCM 3. AMS - not waking up 4. ESRD- usual HD MWF 5. Volume - bilat effusions on  CXR, vol ^ 6. Hypotension - off pressors today 7. Anemia  - hgb 8.7 post HD up to 10.9 without transfusion - last outpt Hgb was 10.6 11/15 with 31% sat; redose next tmt at 60 - 8. Metabolic bone disease -  Continue calcitriol - convert to IV 9. EOL - made limited code today. Quality of life was poor in OP setting per pt's primary nephrologist.   Plan - HD tomorrow, small UF as tolerated    RKelly SplinterMD CCenterburgpager [phone]  11/26/2016, 10:51 AM    Recent Labs Lab 11/25/16 1200 11/25/16 1750 11/26/16 0515  NA 133* 131* 131*  K 3.8 3.5 3.7  CL 97* 97* 95*  CO2 '26 25 25  '$ GLUCOSE 111* 136* 106*  BUN '7 9 14  '$ CREATININE 3.86* 3.95* 4.49*  CALCIUM 7.0* 7.0* 7.1*  PHOS 2.9 2.8 2.5    Recent Labs Lab 11/25/16 1200  ALBUMIN 1.6*    Recent Labs Lab 11/25/16 0348 11/25/16 1200 11/26/16 0515  WBC 20.4* 16.6* 15.4*  HGB 9.7* 8.8* 7.9*  HCT 30.4* 27.1* 24.2*  MCV 87.6 87.1 86.1  PLT 100* 77* 84*   Iron/TIBC/Ferritin/ %Sat No results found for: IRON, TIBC, FERRITIN, IRONPCTSAT

## 2016-11-26 DEATH — deceased

## 2016-11-27 ENCOUNTER — Inpatient Hospital Stay (HOSPITAL_COMMUNITY): Payer: Medicare Other

## 2016-11-27 DIAGNOSIS — E876 Hypokalemia: Secondary | ICD-10-CM

## 2016-11-27 DIAGNOSIS — J9601 Acute respiratory failure with hypoxia: Secondary | ICD-10-CM

## 2016-11-27 DIAGNOSIS — I429 Cardiomyopathy, unspecified: Secondary | ICD-10-CM

## 2016-11-27 DIAGNOSIS — G931 Anoxic brain damage, not elsewhere classified: Secondary | ICD-10-CM

## 2016-11-27 LAB — CBC
HEMATOCRIT: 26 % — AB (ref 39.0–52.0)
HEMOGLOBIN: 8.4 g/dL — AB (ref 13.0–17.0)
MCH: 27.6 pg (ref 26.0–34.0)
MCHC: 32.3 g/dL (ref 30.0–36.0)
MCV: 85.5 fL (ref 78.0–100.0)
Platelets: 79 10*3/uL — ABNORMAL LOW (ref 150–400)
RBC: 3.04 MIL/uL — ABNORMAL LOW (ref 4.22–5.81)
RDW: 15.2 % (ref 11.5–15.5)
WBC: 20.9 10*3/uL — ABNORMAL HIGH (ref 4.0–10.5)

## 2016-11-27 LAB — BASIC METABOLIC PANEL
Anion gap: 12 (ref 5–15)
BUN: 29 mg/dL — AB (ref 6–20)
CALCIUM: 7.8 mg/dL — AB (ref 8.9–10.3)
CHLORIDE: 95 mmol/L — AB (ref 101–111)
CO2: 25 mmol/L (ref 22–32)
CREATININE: 5.37 mg/dL — AB (ref 0.61–1.24)
GFR calc non Af Amer: 9 mL/min — ABNORMAL LOW (ref >60)
GFR, EST AFRICAN AMERICAN: 11 mL/min — AB (ref >60)
Glucose, Bld: 106 mg/dL — ABNORMAL HIGH (ref 65–99)
Potassium: 3.6 mmol/L (ref 3.5–5.1)
Sodium: 132 mmol/L — ABNORMAL LOW (ref 135–145)

## 2016-11-27 LAB — GLUCOSE, CAPILLARY
GLUCOSE-CAPILLARY: 105 mg/dL — AB (ref 65–99)
GLUCOSE-CAPILLARY: 108 mg/dL — AB (ref 65–99)
GLUCOSE-CAPILLARY: 108 mg/dL — AB (ref 65–99)
GLUCOSE-CAPILLARY: 120 mg/dL — AB (ref 65–99)
GLUCOSE-CAPILLARY: 125 mg/dL — AB (ref 65–99)
GLUCOSE-CAPILLARY: 141 mg/dL — AB (ref 65–99)
Glucose-Capillary: 123 mg/dL — ABNORMAL HIGH (ref 65–99)
Glucose-Capillary: 114 mg/dL — ABNORMAL HIGH (ref 65–99)

## 2016-11-27 MED ORDER — SODIUM CHLORIDE 0.9 % IV SOLN: 100.0000 mL | INTRAVENOUS | Status: DC | PRN

## 2016-11-27 MED ORDER — HEPARIN SODIUM (PORCINE) 1000 UNIT/ML DIALYSIS: 1000.0000 [IU] | INTRAMUSCULAR | Status: DC | PRN

## 2016-11-27 MED ORDER — ALTEPLASE 2 MG IJ SOLR: 2.0000 mg | Freq: Once | INTRAMUSCULAR | Status: DC | PRN

## 2016-11-27 MED ORDER — LIDOCAINE HCL (PF) 1 % IJ SOLN: 5.0000 mL | INTRAMUSCULAR | Status: DC | PRN

## 2016-11-27 MED ORDER — PENTAFLUOROPROP-TETRAFLUOROETH EX AERO
1.0000 | INHALATION_SPRAY | CUTANEOUS | Status: DC | PRN
Start: 2016-11-27 — End: 2016-11-28
  Filled 2016-11-27: qty 30

## 2016-11-27 MED ORDER — HEPARIN SODIUM (PORCINE) 1000 UNIT/ML DIALYSIS: 2000.0000 [IU] | Freq: Once | INTRAMUSCULAR | Status: DC

## 2016-11-27 MED ORDER — LIDOCAINE-PRILOCAINE 2.5-2.5 % EX CREA
1.0000 | TOPICAL_CREAM | CUTANEOUS | Status: DC | PRN
Start: 2016-11-27 — End: 2016-11-28
  Filled 2016-11-27: qty 5

## 2016-11-27 NOTE — Progress Notes (Signed)
Arrived to patient room 4N-16 at 1021.  Reviewed treatment plan and this RN agrees.  Report received from bedside RN, Manuela Schwartz.  Consent verified.  Patient Non responsive, intubated on vent. Lung sounds clear to ausculation in all fields. Moderate edema noted BL in upper and lower extremities. Cardiac: NSR.  Prepped LUAVG with alcohol and cannulated with two 15 gauge needles.  Pulsation of blood noted.  Flushed access well with saline per protocol.  Connected and secured lines and initiated tx at 1035.  UF goal of 3000 mL and net fluid removal of 2500 mL.  Will continue to monitor.  30 minutes added to prescribed treatment time per nephrology at bedside.

## 2016-11-27 NOTE — Progress Notes (Signed)
  [facility] Progress Note   Subjective: off pressors, no change  Vitals:   11/27/16 0820 11/27/16 0825 11/27/16 1021 11/27/16 1035  BP:   (!) 147/63 (!) 152/66  Pulse:   70 71  Resp:   15 16  Temp:  (!) 95.7 F (35.4 C) (!) 96 F (35.6 C)   TempSrc:  Axillary Axillary   SpO2: 100%  100% 100%  Weight:   86.2 kg (190 lb 0.6 oz)   Height:        Inpatient medications: . aspirin  325 mg Per NG tube Daily  . calcitRIOL  1.3 mcg Intravenous Q M,W,F-HD  . chlorhexidine gluconate (MEDLINE KIT)  15 mL Mouth Rinse BID  . darbepoetin (ARANESP) injection - DIALYSIS  60 mcg Intravenous Q Wed-HD  . dextrose  50 mL Intravenous Once  . feeding supplement (NEPRO CARB STEADY)  1,000 mL Per Tube Q24H  . feeding supplement (PRO-STAT SUGAR FREE 64)  30 mL Per Tube TID  . heparin  5,000 Units Subcutaneous Q8H  . mouth rinse  15 mL Mouth Rinse 10 times per day  . pantoprazole sodium  40 mg Per Tube Daily  . sodium chloride flush  10-40 mL Intracatheter Q12H   . sodium chloride 10 mL/hr at 11/27/16 0700  . dextrose 40 mL/hr at 11/25/16 1900  . norepinephrine (LEVOPHED) Adult infusion Stopped (11/25/16 2310)  . vasopressin (PITRESSIN) infusion - *FOR SHOCK* Stopped (11/26/16 0848)   sodium chloride, sodium chloride, fentaNYL (SUBLIMAZE) injection, sodium chloride flush  Exam: General: on vent, not responsive HEENT: intubated/oral ET tub Lungs:  clear bilat ant/ lat Heart: RRR  Abdomen:  soft dim BS, right fem central line Ext: mild LE/ UE edema Neuro:  unresponsive Dialysis Access: left upper AVF + bruit  Dialysis Orders: MWF [facility]   3h 45mn   77kg  2/2 bath  Hep 2000   LUA AVF Mircera 75 q 2 weeks-lst 11/15  Calcitriol 1.25 - iPTH 190 - Ca usually 9s and P in goal  Assessment: 1. SP cardiac arrest- V fib arrest 2. VDRF - per CCM 3. AMS - not responsive 4. ESRD- usual HD MWF 5. Volume - vol is up 6. Hypotension - resolved 7. Anemia  - hgb 8-9 , cont darbe 60/wk,  transfuse prn 8. Metabolic bone disease -  Continue calcitriol - convert to IV 9. EOL - limited code  Plan - HD today, UF 2 L as tRonnie DerbyMD CRohrersvillepager [phone]  11/27/2016, 10:56 AM    Recent Labs Lab 11/25/16 1200 11/25/16 1750 11/26/16 0515 11/27/16 0426  NA 133* 131* 131* 132*  K 3.8 3.5 3.7 3.6  CL 97* 97* 95* 95*  CO2 '26 25 25 25  '$ GLUCOSE 111* 136* 106* 106*  BUN '7 9 14 '$ 29*  CREATININE 3.86* 3.95* 4.49* 5.37*  CALCIUM 7.0* 7.0* 7.1* 7.8*  PHOS 2.9 2.8 2.5  --     Recent Labs Lab 11/25/16 1200  ALBUMIN 1.6*    Recent Labs Lab 11/25/16 1200 11/26/16 0515 11/27/16 0426  WBC 16.6* 15.4* 20.9*  HGB 8.8* 7.9* 8.4*  HCT 27.1* 24.2* 26.0*  MCV 87.1 86.1 85.5  PLT 77* 84* 79*   Iron/TIBC/Ferritin/ %Sat No results found for: IRON, TIBC, FERRITIN, IRONPCTSAT

## 2016-11-27 NOTE — Progress Notes (Signed)
Dialysis treatment completed.  3000 mL ultrafiltrated and net fluid removal 2500 mL.    Patient status unchanged. Lung sounds clear to ausculation in all fields. Moderate BLE upper and lower 2+ pitting edema. Cardiac: NSR with some ectopy.  Disconnected lines and removed needles.  Pressure held for 10 minutes and band aid/gauze dressing applied.  Report given to bedside RN, Manuela Schwartz.

## 2016-11-27 NOTE — Progress Notes (Signed)
Pts. Wife very vocal and disruptive to dialysis nurse, housekeeping and myself. She is yelling and telling  Me what she wants to be done. I tried to explain the reason for raising his head as she wants it flat because he will get a "crick" in his neck. She says she will call the [facility] children. I give her my name if she chooses to do so. I try to explain her husband's care is my responsibility which I take very seriously, however she says she will take care of him while she is here. I see I am getting nowhere and inform the charge nurse. The charge nurse and [facility] are talking with the wife. Meanwhile the housekeeping lady is outside crying. She has many complaints about the hospital in general.

## 2016-11-27 NOTE — Progress Notes (Signed)
PULMONARY / CRITICAL CARE MEDICINE   Name: Ronald Brewer MRN: [id number] DOB: Nov 24, 1941    ADMISSION DATE:  11/06/2016 CONSULTATION DATE:  11/27/2016   CHIEF COMPLAINT:  Cardiac arrest  HISTORY OF PRESENT ILLNESS:   75 year old man with ESRD on hemodialysis and CAD was brought in by EMS after a witnessed cardiac arrest at dialysis 11/27. He finished his treatment, then had a syncopal episode and vomited, again syncope with loss of pulse-shock advised the AED. After EMS arrival, noted to be in V. fib given epinephrine 1 with ROSC, total CPR time about 15 minutes. He is a diabetic hypertensive neck: 2011 shows normal ejection fraction He was admitted 10/2016 for generalized weakness, black stools and hemoglobin about 9 attributed to anemia of chronic disease He remained unresponsive after arrival to the emergency room had nonpurposeful movement of both upper extremities EKG showed prolonged QT, frequent PVCs   SUBJECTIVE:  Remains comatose, critically ill Off levophed gtt > 24 hrs.  No issues overnight  VITAL SIGNS: BP (!) 119/40   Pulse 69   Temp 97.6 F (36.4 C) (Oral)   Resp 15   Ht 5\' 10"  (1.778 m)   Wt 86.2 kg (190 lb 0.6 oz)   SpO2 100%   BMI 27.27 kg/m   HEMODYNAMICS: CVP:  [10 mmHg-18 mmHg] 16 mmHg  VENTILATOR SETTINGS: Vent Mode: PRVC FiO2 (%):  [40 %] 40 % Set Rate:  [15 bmp] 15 bmp Vt Set:  [450 mL] 450 mL PEEP:  [5 cmH20] 5 cmH20 Plateau Pressure:  [19 cmH20-22 cmH20] 21 cmH20  INTAKE / OUTPUT: I/O last 3 completed shifts: In: 2707.8 [I.V.:1920.1; Other:30; NG/GT:757.7] Out: 500 [Stool:500]  PHYSICAL EXAMINATION:   General:  chronically ill-appearing elderly  intubated ,  Neuro:  Unresponsive to DPS, GCS 3 HEENT:  Oral ET tube, pupils 3 mm bilaterally equally reactive to light  S1-S2 normal, no rub, no murmur Decreased breath sounds bilateral, no rhonchi   soft and nontender abdomen Extremities-left AV fistula with thrill, right femoral  CVL   LABS:  BMET  Recent Labs Lab 11/25/16 1750 11/26/16 0515 11/27/16 0426  NA 131* 131* 132*  K 3.5 3.7 3.6  CL 97* 95* 95*  CO2 25 25 25   BUN 9 14 29*  CREATININE 3.95* 4.49* 5.37*  GLUCOSE 136* 106* 106*    Electrolytes  Recent Labs Lab 11/25/16 0348 11/25/16 1200 11/25/16 1750 11/26/16 0515 11/27/16 0426  CALCIUM 7.1* 7.0* 7.0* 7.1* 7.8*  MG 1.7  --  1.6* 1.6*  --   PHOS 5.0* 2.9 2.8 2.5  --     CBC  Recent Labs Lab 11/25/16 1200 11/26/16 0515 11/27/16 0426  WBC 16.6* 15.4* 20.9*  HGB 8.8* 7.9* 8.4*  HCT 27.1* 24.2* 26.0*  PLT 77* 84* 79*    Coag's  Recent Labs Lab 11/05/2016 1722 11/23/16 0205  APTT 40* 41*  INR 1.35 1.47    Sepsis Markers  Recent Labs Lab 11/03/2016 1732  LATICACIDVEN 6.09*    ABG  Recent Labs Lab 11/23/16 0338 11/23/16 0646 11/24/16 1718  PHART 7.621* 7.374 7.269*  PCO2ART 21.0* 44.2 57.0*  PO2ART 269* 115* 198*    Liver Enzymes  Recent Labs Lab 11/25/16 1200  ALBUMIN 1.6*    Cardiac Enzymes  Recent Labs Lab 11/04/2016 2040 11/23/16 0100 11/23/16 0605  TROPONINI 0.23* 0.70* 1.04*    Glucose  Recent Labs Lab 11/26/16 0759 11/26/16 1153 11/26/16 1601 11/26/16 1953 11/26/16 2337 11/27/16 0418  GLUCAP 123* 89 102* 94  98 120*    Imaging No results found.   STUDIES:  Echo 11/28>> EF 20%, WMA +, gr 2 DD, RVSP 62  CULTURES:   ANTIBIOTICS:   SIGNIFICANT EVENTS: 11/27 admit  LINES/TUBES: 11/27 rt fem CVL >> 11/27 ETT >>  DISCUSSION:  Cardiac arrest due to ventricular fibrillation in the setting of prolonged QT this may be due to postdialysis shift electrolytes , medication such as Zoloft, intrinsic cardiac disease, acute ischemia unlikely   PULMONARY A:  Acute hypoxemic respiratory failure  2/2 unable to protect airway 2/2 cardiac arrest, pulm edema.  P:   Prvc, vent settings were reviewed and adjusted  Ct  respiratory rate at 15  CARDIOVASCULAR A:  S/P ventricular  fibrillation Prolonged QT -610 11/28 Cardiogenic shock P:  Cardiology following. Appreciate recs.  Follow QT daily- remains prolonged Replete electrolytes May need defibrillator if he is neuro intact Aim for SBP 110- can dc vaso, do not restart pressors  RENAL A:   ESRD P:   HD 11/30/ Nephrology following. 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  Head CT 11/30 non diagnostic P:   RASS goal:0  Off sedation for neuro checks    FAMILY  - Updates:   Wife 11/29 >>  limited code issued, she hints that he would want to stop HD & not be on prolonged life support. Palliative care team involved. Appreciate recs.   - Inter-disciplinary family meet or Palliative Care meeting due by:  day 7    SUmmary -  Rewarmed, shock resolved ,at risk for further arrhythmias due to prolonged QT, not waking up - fear poor neuro prognosis.  Awaiting decision from wife regarding [place].    The patient is critically ill with multiple organ systems failure and requires high complexity decision making for assessment and support, frequent evaluation and titration of therapies, application of advanced monitoring technologies and extensive interpretation of multiple databases. Critical Care Time devoted to patient care services described in this note independent of APP time is 30 minutes.    Monica Becton, MD 11/27/2016, 7:31 AM [facility] Pager [phone] After 3 pm or if no answer, call [phone]

## 2016-11-27 NOTE — Progress Notes (Addendum)
Daily Progress Note   Patient Name: Ronald Brewer       Date: 11/27/2016 DOB: [birth year]  Age: 75 y.o. MRN#: [id number] Attending Physician: Rigoberto Noel, MD Primary Care Physician: Placido Sou, MD Admit Date: 10/28/2016  Reason for Consultation/Follow-up: Establishing goals of care  Subjective: Met at bedside with wife.  She clearly states that he was wanting to pass on for the last several months.  She believes hemodialysis has warn his body and heart out.   Yet, she states that God has told her when his time will be and this is not it.  He will wake up and walk out of here.    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  She believes that Jesus suffered on the cross and her husband will have to suffer before he leaves this world.  She believes fervently that he will improve.  She described several miracles that she has witnessed.  I conveyed the my concern is that the people who received those miracles had a strong will and wanted them to occur.  Rev. Shorten told me from the beginning that her husband wanted to pass.  If her husband does not have the will to survive he most certainly won't.  I worked to build her trust and promised I would pray for both of them.  Together we reviewed his CXR, Echo, and EEG reports.  I clearly conveyed to her that he was not expected to improve.  Length of Stay: 5  Current Medications: Scheduled Meds:  . aspirin  325 mg Per NG tube Daily  . calcitRIOL  1.3 mcg Intravenous Q M,W,F-HD  . chlorhexidine gluconate (MEDLINE KIT)  15 mL Mouth Rinse BID  . darbepoetin (ARANESP) injection - DIALYSIS  60 mcg Intravenous Q Wed-HD  . dextrose  50 mL Intravenous Once  . feeding supplement (NEPRO CARB STEADY)  1,000 mL Per Tube Q24H   . feeding supplement (PRO-STAT SUGAR FREE 64)  30 mL Per Tube TID  . heparin  5,000 Units Subcutaneous Q8H  . mouth rinse  15 mL Mouth Rinse 10 times per day  . pantoprazole sodium  40 mg Per Tube Daily  . sodium chloride flush  10-40 mL Intracatheter Q12H    Continuous Infusions: . sodium chloride  10 mL/hr at 11/27/16 0700  . dextrose 40 mL/hr at 11/25/16 1900  . norepinephrine (LEVOPHED) Adult infusion Stopped (11/25/16 2310)  . vasopressin (PITRESSIN) infusion - *FOR SHOCK* Stopped (11/26/16 0848)    PRN Meds: sodium chloride, sodium chloride, fentaNYL (SUBLIMAZE) injection, sodium chloride flush  Physical Exam  Constitutional: He appears well-developed.  Intubated.  Does not open eyes.  Moves head and left arm reflexively.  Eyes:  Eyes remain closed.  Neck: No thyromegaly present.  Cardiovascular: Normal rate.   Murmur heard. Pulmonary/Chest:  Intubated.  No apparent distress.  Abdominal: Soft. He exhibits no distension. There is no tenderness.  Musculoskeletal: He exhibits edema.  Swelling noted in hands bilaterally.  Neurological:  Does not respond to my voice or touch.  Skin: Skin is warm and dry.  Multiple skin tears.  Psychiatric:  Unable to assess.          Vital Signs: BP (!) 124/54   Pulse 73   Temp (!) 95.7 F (35.4 C) (Axillary)   Resp (!) 22   Ht 5' 10"  (1.778 m)   Wt 86.2 kg (190 lb 0.6 oz)   SpO2 100%   BMI 27.27 kg/m  SpO2: SpO2: 100 % O2 Device: O2 Device: Ventilator O2 Flow Rate: O2 Flow Rate (L/min): 70 L/min  Intake/output summary:  Intake/Output Summary (Last 24 hours) at 11/27/16 1313 Last data filed at 11/27/16 1200  Gross per 24 hour  Intake          1972.67 ml  Output              100 ml  Net          1872.67 ml   LBM: Last BM Date: 11/25/16 Baseline Weight: Weight: 77 kg (169 lb 12.1 oz) Most recent weight: Weight: 86.2 kg (190 lb 0.6 oz)       Palliative Assessment/Data:    Flowsheet Rows   Flowsheet Row Most Recent  Value  Intake Tab  Referral Department  Critical care  Unit at Time of Referral  ICU  Palliative Care Primary Diagnosis  Neurology  Date Notified  11/25/16  Palliative Care Type  New Palliative care  Reason for referral  Clarify Goals of Care  Date of Admission  10/28/2016  Date first seen by Palliative Care  11/25/16  # of days Palliative referral response time  0 Day(s)  # of days IP prior to Palliative referral  3  Clinical Assessment  Palliative Performance Scale Score  10%  Psychosocial & Spiritual Assessment  Palliative Care Outcomes      Patient Active Problem List   Diagnosis Date Noted  . Anoxic encephalopathy ([place])   . Goals of care, counseling/discussion   . Palliative care encounter   . Acute respiratory failure ([place])   . Cardiac arrest with ventricular fibrillation ([place]) 11/15/2016  . Prolonged QT interval 11/20/2016  . Cardiac arrest ([place]) 11/16/2016  . Anemia 11/08/2016  . Hypokalemia 11/08/2016  . Chest pain 11/08/2016  . Neck pain 11/08/2016  . ESRD on dialysis ([place]) 09/18/2015  . 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 Ischemic stroke ([place]) 09/18/2015  . Headache 09/18/2015  . Polyneuropathy ([place]) 09/18/2015  . Diplopia 09/18/2015    Palliative Care Assessment & Plan   Patient Profile: 75 y.o. male  with past medical history of CAD and MI, ESRD on HD, and DM who was admitted on 11/07/2016 with cardiac arrest in hemodialysis.  Mr. Shew reportedly received 15 minutes of CPR including  defibrillation.  He is now in ICU intubated and unresponsive.  He is now off pressors and receiving hemodialysis.  His EEG shows severe diffuse cerebral dysfunction. His echo shows G2 diastolic dysfunction with an EF of 15 - 20%.  There is concern for anoxic brain injury. PMT was consulted for goals of care.  Assessment: 75 yo male.  Poor will to live prior to Code. Physically very poor prognosis for recovery.  Recommendations/Plan:  DNR  Continue current care for  now.   Gildardo Pounds may respond well to Dr. Jimmy Footman as she knows and respects him.    I think this will be a slow process and in the end may require an ethics consult.  I will return to work on Tuesday and promised Rev Brosky I would continue to follow up with her.  Goals of Care and Additional Recommendations:  Limitations on Scope of Treatment: Full Scope Treatment, DNR/DNI  Prognosis:   Unable to determine.  Patient currently intubated, receiving HD 3x week, OG tube in place for feeding.  Discharge Planning:  To Be Determined  [facility] Death.  Care plan discussed with CCM Resident.  Thank you for allowing the Palliative Medicine Team to assist in the care of this patient.   Time In: 12:00 Time Out: 12:45 Total Time 45 min Prolonged Time Billed no      Greater than 50%  of this time was spent counseling and coordinating care related to the above assessment and plan.  Imogene Burn, PA-C Palliative Medicine   Please contact Palliative MedicineTeam phone at [phone] for questions and concerns.   Please see AMION for individual provider pager numbers.

## 2016-11-27 NOTE — Progress Notes (Signed)
Patient Name: Ronald Brewer Date of Encounter: 11/27/2016  Primary Cardiologist: [facility] Problem List     Active Problems:   ESRD on dialysis [facility])   Anemia   Hypokalemia   Cardiac arrest with ventricular fibrillation (HCC)   Prolonged QT interval   Cardiac arrest (HCC)   Acute respiratory failure (HCC)   Anoxic encephalopathy (HCC)   Goals of care, counseling/discussion   Palliative care encounter     Subjective   Intubated, ventilator, not responsive.  Inpatient Medications    Scheduled Meds: . aspirin  325 mg Per NG tube Daily  . calcitRIOL  1.3 mcg Intravenous Q M,W,F-HD  . chlorhexidine gluconate (MEDLINE KIT)  15 mL Mouth Rinse BID  . darbepoetin (ARANESP) injection - DIALYSIS  60 mcg Intravenous Q Wed-HD  . dextrose  50 mL Intravenous Once  . feeding supplement (NEPRO CARB STEADY)  1,000 mL Per Tube Q24H  . feeding supplement (PRO-STAT SUGAR FREE 64)  30 mL Per Tube TID  . heparin  5,000 Units Subcutaneous Q8H  . mouth rinse  15 mL Mouth Rinse 10 times per day  . pantoprazole sodium  40 mg Per Tube Daily  . sodium chloride flush  10-40 mL Intracatheter Q12H   Continuous Infusions: . sodium chloride 10 mL/hr at 11/25/16 1900  . dextrose 40 mL/hr at 11/25/16 1900  . norepinephrine (LEVOPHED) Adult infusion Stopped (11/25/16 2310)  . vasopressin (PITRESSIN) infusion - *FOR SHOCK* Stopped (11/26/16 0848)   PRN Meds: sodium chloride, sodium chloride, fentaNYL (SUBLIMAZE) injection, sodium chloride flush   Vital Signs    Vitals:   11/27/16 0400 11/27/16 0414 11/27/16 0500 11/27/16 0600  BP:      Pulse: 75  71 69  Resp: 17  15 15   Temp: 97.6 F (36.4 C)     TempSrc: Oral     SpO2: 100%  100% 100%  Weight:  190 lb 0.6 oz (86.2 kg)    Height:        Intake/Output Summary (Last 24 hours) at 11/27/16 0742 Last data filed at 11/27/16 0600  Gross per 24 hour  Intake          1657.18 ml  Output              100 ml  Net          1557.18 ml    Filed Weights   11/25/16 1035 11/26/16 0500 11/27/16 0414  Weight: 183 lb 3.2 oz (83.1 kg) 197 lb 15.6 oz (89.8 kg) 190 lb 0.6 oz (86.2 kg)    Physical Exam   GEN: intubated, unresponsive, appears chronically ill HEENT: ETT. Grossly normal.  Neck: Supple, no JVD, carotid bruits, or masses. Cardiac: RRR, no murmurs, rubs, or gallops. No clubbing, cyanosis, edema.  Radials/DP/PT 2+ and equal bilaterally.  Respiratory:  Respirations regular and unlabored, a few rhonchi, otherwise clear to auscultation bilaterally. GI: Soft, nontender, nondistended, BS + x 4. MS: no deformity or atrophy. Skin: warm and dry, no rash. Neuro:  No response to deep pain stimulation, PERRL .  Labs    CBC  Recent Labs  11/26/16 0515 11/27/16 0426  WBC 15.4* 20.9*  HGB 7.9* 8.4*  HCT 24.2* 26.0*  MCV 86.1 85.5  PLT 84* 79*   Basic Metabolic Panel  Recent Labs  11/25/16 1750 11/26/16 0515 11/27/16 0426  NA 131* 131* 132*  K 3.5 3.7 3.6  CL 97* 95* 95*  CO2 25 25 25   GLUCOSE 136* 106*  106*  BUN 9 14 29*  CREATININE 3.95* 4.49* 5.37*  CALCIUM 7.0* 7.1* 7.8*  MG 1.6* 1.6*  --   PHOS 2.8 2.5  --    Liver Function Tests  Recent Labs  11/25/16 1200  ALBUMIN 1.6*     Telemetry    NSR, PVCs, no sustained arrhythmia - Personally Reviewed  ECG    Last tracing 11/28 - NSR, IWMI, severe repol changes in precordial leads and QT>600 - Personally Reviewed  Radiology    Ct Head Wo Contrast  Result Date: 11/25/2016 CLINICAL DATA:  Post cardiac arrest, question anoxic encephalopathy EXAM: CT HEAD WITHOUT CONTRAST TECHNIQUE: Contiguous axial images were obtained from the base of the skull through the vertex without intravenous contrast. COMPARISON:  MR brain 08/22/2015 FINDINGS: Brain: Generalized atrophy. Normal ventricular morphology. No midline shift or mass effect. Mild small vessel chronic ischemic changes of deep cerebral white matter. No intracranial hemorrhage, mass lesion,  evidence of acute infarction, or extra-axial fluid collections. No definite brain edema or sulcal effacement identified. Vascular: Atherosclerotic calcifications at carotid siphons Skull: Intact Sinuses/Orbits: Mucosal thickening in the maxillary sinuses Other: N/A IMPRESSION: Generalized atrophy with mild small vessel chronic ischemic changes of deep cerebral white matter. No acute intracranial abnormalities. Electronically Signed   By: Lavonia Dana M.D.   On: 11/25/2016 13:11   Dg Chest Port 1 View  Result Date: 11/26/2016 CLINICAL DATA:  Acute respiratory failure. EXAM: PORTABLE CHEST 1 VIEW COMPARISON:  11/25/2016. FINDINGS: Cardiomegaly. ET tube has been withdrawn slightly, now 4.9 cm above carina. BILATERAL pleural effusions. Central airspace opacities most consistent with edema. No pneumothorax. No osseous findings. IMPRESSION: Cardiomegaly.  Mild edema is suspected.  Essentially stable chest. Electronically Signed   By: Staci Righter M.D.   On: 11/26/2016 06:46    Cardiac Studies   11/23/16 echo:  - Left ventricle: The cavity size was normal. Wall thickness was   increased in a pattern of moderate LVH. There was moderate   concentric hypertrophy. Systolic function was severely reduced.   The estimated ejection fraction was in the range of 15% to 20%.   There is akinesis of the entireapical myocardium. There is   akinesis of the apicalinferior and apical myocardium. Features   are consistent with a pseudonormal left ventricular filling   pattern, with concomitant abnormal relaxation and increased   filling pressure (grade 2 diastolic dysfunction). Doppler   parameters are consistent with high ventricular filling pressure. - Aortic valve: Structurally normal valve. Trileaflet. - Mitral valve: There was moderate regurgitation. - Left atrium: The atrium was moderately dilated. - Pulmonary arteries: Systolic pressure was moderately increased.   PA peak pressure: 62 mm Hg  (S).  07/23/2010 echo  - Left ventricle: The cavity size was normal. Wall thickness was  increased increased in a pattern of mild to moderate LVH. Systolic  function was normal. The estimated ejection fraction was in the  range of 50% to 55%. Wall motion was normal; there were no  regional wall motion abnormalities. Doppler parameters are  consistent with abnormal left ventricular relaxation (grade 1  diastolic dysfunction). - Mitral valve: Mild regurgitation. Patient Profile     Elderly man with DM, ESRD on HD, s/p resuscitated VF arrest, severe QT prolongation in the setting of moderate hypokalemia, s/p hypothermia, remains comatose and pressor dependent. Echo with with new severe LV dysfunction and regional (apical) abnormalities.  Assessment & Plan    1. VF arrest: [place] in setting of very long QT, partially explained  by hypokalemia (maybe also SSRI), but with preexisting long QT. Recheck ECG today. Avoid any QT prolonging agents. 2. CMP: unclear if this is exclusively due to the arrest. Normal EF 2011. Multiple CAD risk factors. Further workup depends on degree of neuro recovery. 3. Shock: pressor dependent 4. Resp failure: on vent, poor level of consciousness precludes any weaning efforts. 5. ESRD 6. Anoxic encephalopathy: so far no meaningful recovery. Prognosis is grim. Palliative care engaged, but interaction with patient's family has been challenging so far.  Signed, Sanda Klein, MD  11/27/2016, 7:42 AM

## 2016-11-28 ENCOUNTER — Other Ambulatory Visit (HOSPITAL_COMMUNITY): Payer: Medicare Other

## 2016-11-28 LAB — POCT I-STAT 3, ART BLOOD GAS (G3+)
ACID-BASE EXCESS: 7 mmol/L — AB (ref 0.0–2.0)
Bicarbonate: 31.4 mmol/L — ABNORMAL HIGH (ref 20.0–28.0)
O2 Saturation: 99 %
PH ART: 7.39 (ref 7.350–7.450)
TCO2: 33 mmol/L (ref 0–100)
pCO2 arterial: 54.7 mmHg — ABNORMAL HIGH (ref 32.0–48.0)
pO2, Arterial: 178 mmHg — ABNORMAL HIGH (ref 83.0–108.0)

## 2016-11-28 LAB — GLUCOSE, CAPILLARY: Glucose-Capillary: 121 mg/dL — ABNORMAL HIGH (ref 65–99)

## 2016-11-28 MED ORDER — HEPARIN SODIUM (PORCINE) 1000 UNIT/ML DIALYSIS: 1000.0000 [IU] | INTRAMUSCULAR | Status: DC | PRN

## 2016-11-28 MED ORDER — ISOSORB DINITRATE-HYDRALAZINE 20-37.5 MG PO TABS
1.0000 | ORAL_TABLET | Freq: Three times a day (TID) | ORAL | Status: DC
  Administered 2016-11-28 – 2016-11-30 (×8): 1 via ORAL
  Filled 2016-11-28 (×10): qty 1

## 2016-11-28 MED ORDER — LIDOCAINE HCL (PF) 1 % IJ SOLN: 5.0000 mL | INTRAMUSCULAR | Status: DC | PRN

## 2016-11-28 MED ORDER — ALTEPLASE 2 MG IJ SOLR: 2.0000 mg | Freq: Once | INTRAMUSCULAR | Status: DC | PRN

## 2016-11-28 MED ORDER — PENTAFLUOROPROP-TETRAFLUOROETH EX AERO: 1.0000 "application " | INHALATION_SPRAY | CUTANEOUS | Status: DC | PRN

## 2016-11-28 MED ORDER — LIDOCAINE-PRILOCAINE 2.5-2.5 % EX CREA: 1.0000 "application " | TOPICAL_CREAM | CUTANEOUS | Status: DC | PRN

## 2016-11-28 MED ORDER — HEPARIN SODIUM (PORCINE) 1000 UNIT/ML DIALYSIS: 3000.0000 [IU] | INTRAMUSCULAR | Status: DC | PRN

## 2016-11-28 MED ORDER — SODIUM CHLORIDE 0.9 % IV SOLN: 100.0000 mL | INTRAVENOUS | Status: DC | PRN

## 2016-11-28 NOTE — Progress Notes (Signed)
  [facility] Progress Note   Subjective: off pressors, no change  Vitals:   11/28/16 1400 11/28/16 1421 11/28/16 1500 11/28/16 1531  BP: (!) 121/54  (!) 149/58   Pulse: 69  74   Resp: 15  15   Temp:  (!) 94 F (34.4 C)    TempSrc:  Axillary    SpO2: 100%  100% 100%  Weight:      Height:        Inpatient medications: . aspirin  325 mg Per NG tube Daily  . calcitRIOL  1.3 mcg Intravenous Q M,W,F-HD  . chlorhexidine gluconate (MEDLINE KIT)  15 mL Mouth Rinse BID  . darbepoetin (ARANESP) injection - DIALYSIS  60 mcg Intravenous Q Wed-HD  . dextrose  50 mL Intravenous Once  . feeding supplement (NEPRO CARB STEADY)  1,000 mL Per Tube Q24H  . feeding supplement (PRO-STAT SUGAR FREE 64)  30 mL Per Tube TID  . heparin  2,000 Units Dialysis Once in dialysis  . heparin  5,000 Units Subcutaneous Q8H  . isosorbide-hydrALAZINE  1 tablet Oral TID  . mouth rinse  15 mL Mouth Rinse 10 times per day  . pantoprazole sodium  40 mg Per Tube Daily  . sodium chloride flush  10-40 mL Intracatheter Q12H   . sodium chloride 10 mL/hr at 11/28/16 0700  . dextrose 40 mL/hr at 11/25/16 1900   sodium chloride, sodium chloride, sodium chloride, alteplase, fentaNYL (SUBLIMAZE) injection, heparin, lidocaine (PF), lidocaine-prilocaine, pentafluoroprop-tetrafluoroeth, sodium chloride flush  Exam: General: on vent,  HEENT: intubated/oral ET tub Lungs:  clear bilat ant/ lat Heart: RRR  Abdomen:  soft dim BS, right fem central line Ext: mild LE/ UE edema Neuro:  unresponsive Dialysis Access: left upper AVF + bruit  Dialysis Orders: MWF [facility]   3h 79mn   77kg  2/2 bath  Hep 2000   LUA AVF Mircera 75 q 2 weeks-lst 11/15  Calcitriol 1.25 - iPTH 190 - Ca usually 9s and P in goal  Assessment: 1. SP cardiac arrest- V fib arrest 2. VDRF - per CCM 3. AMS - not waking up yet 4. ESRD- usual HD MWF 5. Volume - vol is up 6. Hypotension - resolved 7. Anemia  - hgb 8-9 , cont darbe 60/wk,  transfuse prn 8. Metabolic bone disease -  Continue calcitriol - convert to IV 9. EOL - limited code  Plan - HD Monday, UF 2-3kg    RKelly SplinterMD CExecutive [facility] pager [phone]  11/28/2016, 3:39 PM    Recent Labs Lab 11/25/16 1200 11/25/16 1750 11/26/16 0515 11/27/16 0426  NA 133* 131* 131* 132*  K 3.8 3.5 3.7 3.6  CL 97* 97* 95* 95*  CO2 '26 25 25 25  '$ GLUCOSE 111* 136* 106* 106*  BUN '7 9 14 '$ 29*  CREATININE 3.86* 3.95* 4.49* 5.37*  CALCIUM 7.0* 7.0* 7.1* 7.8*  PHOS 2.9 2.8 2.5  --     Recent Labs Lab 11/25/16 1200  ALBUMIN 1.6*    Recent Labs Lab 11/25/16 1200 11/26/16 0515 11/27/16 0426  WBC 16.6* 15.4* 20.9*  HGB 8.8* 7.9* 8.4*  HCT 27.1* 24.2* 26.0*  MCV 87.1 86.1 85.5  PLT 77* 84* 79*   Iron/TIBC/Ferritin/ %Sat No results found for: IRON, TIBC, FERRITIN, IRONPCTSAT

## 2016-11-28 NOTE — Progress Notes (Signed)
PULMONARY / CRITICAL CARE MEDICINE   Name: Ronald Brewer MRN: [id number] DOB: 1941-05-16    ADMISSION DATE:  10/27/2016 CONSULTATION DATE:  11/28/2016   CHIEF COMPLAINT:  Cardiac arrest  HISTORY OF PRESENT ILLNESS:   75 year old man with ESRD on hemodialysis and CAD was brought in by EMS after a witnessed cardiac arrest at dialysis 11/27. He finished his treatment, then had a syncopal episode and vomited, again syncope with loss of pulse-shock advised the AED. After EMS arrival, noted to be in V. fib given epinephrine 1 with ROSC, total CPR time about 15 minutes. He is a diabetic hypertensive neck: 2011 shows normal ejection fraction He was admitted 10/2016 for generalized weakness, black stools and hemoglobin about 9 attributed to anemia of chronic disease He remained unresponsive after arrival to the emergency room had nonpurposeful movement of both upper extremities EKG showed prolonged QT, frequent PVCs   SUBJECTIVE:  Remains comatose, critically ill. No neurologic change since last seen. Tolerated HD 12/2 Off levophed gtt  No issues overnight    VITAL SIGNS: BP (!) 156/63 (BP Location: Right Arm)   Pulse 71   Temp 97.3 F (36.3 C) (Axillary)   Resp 16   Ht 5\' 10"  (1.778 m)   Wt 84.4 kg (186 lb 1.1 oz)   SpO2 99%   BMI 26.70 kg/m   HEMODYNAMICS:    VENTILATOR SETTINGS: Vent Mode: PRVC FiO2 (%):  [30 %-40 %] 30 % Set Rate:  [15 bmp] 15 bmp Vt Set:  [450 mL] 450 mL PEEP:  [5 cmH20] 5 cmH20 Plateau Pressure:  [13 cmH20-23 cmH20] 13 cmH20  INTAKE / OUTPUT: I/O last 3 completed shifts: In: 2400 [P.O.:700; I.V.:830; NG/GT:870] Out: 2700 [Other:2500; Stool:200]  PHYSICAL EXAMINATION:   General:  chronically ill-appearing elderly  intubated ,  Neuro:  Unresponsive to DPS, GCS 3 HEENT:  Oral ET tube, pupils 3 mm bilaterally equally reactive to light  S1-S2 normal, no rub, no murmur Decreased breath sounds bilateral, no rhonchi   soft and nontender  abdomen Extremities-left AV fistula with thrill, right femoral CVL. Gr 2 edema   LABS:  BMET  Recent Labs Lab 11/25/16 1750 11/26/16 0515 11/27/16 0426  NA 131* 131* 132*  K 3.5 3.7 3.6  CL 97* 95* 95*  CO2 25 25 25   BUN 9 14 29*  CREATININE 3.95* 4.49* 5.37*  GLUCOSE 136* 106* 106*    Electrolytes  Recent Labs Lab 11/25/16 0348 11/25/16 1200 11/25/16 1750 11/26/16 0515 11/27/16 0426  CALCIUM 7.1* 7.0* 7.0* 7.1* 7.8*  MG 1.7  --  1.6* 1.6*  --   PHOS 5.0* 2.9 2.8 2.5  --     CBC  Recent Labs Lab 11/25/16 1200 11/26/16 0515 11/27/16 0426  WBC 16.6* 15.4* 20.9*  HGB 8.8* 7.9* 8.4*  HCT 27.1* 24.2* 26.0*  PLT 77* 84* 79*    Coag's  Recent Labs Lab 11/19/2016 1722 11/23/16 0205  APTT 40* 41*  INR 1.35 1.47    Sepsis Markers  Recent Labs Lab 10/31/2016 1732  LATICACIDVEN 6.09*    ABG  Recent Labs Lab 11/23/16 0338 11/23/16 0646 11/24/16 1718  PHART 7.621* 7.374 7.269*  PCO2ART 21.0* 44.2 57.0*  PO2ART 269* 115* 198*    Liver Enzymes  Recent Labs Lab 11/25/16 1200  ALBUMIN 1.6*    Cardiac Enzymes  Recent Labs Lab 11/08/2016 2040 11/23/16 0100 11/23/16 0605  TROPONINI 0.23* 0.70* 1.04*    Glucose  Recent Labs Lab 11/27/16 0826 11/27/16 1151 11/27/16 1640  11/27/16 2039 11/27/16 2311 11/28/16 0345  GLUCAP 108* 108* 105* 123* 114* 121*    Imaging No results found.   STUDIES:  Echo 11/28>> EF 20%, WMA +, gr 2 DD, RVSP 62  CULTURES:   ANTIBIOTICS:   SIGNIFICANT EVENTS: 11/27 admit  LINES/TUBES: 11/27 rt fem CVL >> 11/27 ETT >>  DISCUSSION:  Cardiac arrest due to ventricular fibrillation in the setting of prolonged QT this may be due to postdialysis shift electrolytes , medication such as Zoloft, intrinsic cardiac disease, acute ischemia unlikely   PULMONARY A:  Acute hypoxemic respiratory failure  2/2 unable to protect airway 2/2 cardiac arrest, pulm edema.  P:   Cont vent support. No weaning 2/2  Poor neurologic status.   Will check ABG and adjust vent accordingly. Was hypercapneic in last abg   CARDIOVASCULAR A:  S/P ventricular fibrillation Prolonged QT -610 11/28 Cardiogenic shock. Resolved.  P:  Cardiology following. Appreciate recs.  Follow QT daily- remains prolonged Replete electrolytes May need defibrillator if he is neuro intact   RENAL A:   ESRD P:   HD intermittent Nephrology following. Appreciate recs.   GI A:   no issues P:   protonix  Ct TFs  Heme A:   anemia of chronic disease P:  Follow  ID A:   no issues P:   Concern for aspiration but hold off antibiotics for now.  Abx have been held since admission.  Initial plan was to withdraw care but the wife is currently undecided with [place].  Will observe off abx for now.  If WBC trends up and if he spikes, will pan culture and start Vanc and Zosyn.  Also, pt has had femoral TLC since admission.  If pt will be a full code, need to change to neck line.    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  Head CT 11/30 non diagnostic P:   RASS goal:0  Off sedation for neuro checks    FAMILY  - Updates:   Wife 11/29 >>  limited code issued, she hints that he would want to stop HD & not be on prolonged life support. Palliative care team involved. Appreciate recs. Wife very undecided re: care and she is inconsistent with what she wants.  Palliative care was hinting about having ethics committee be involved.  I agree if we need to involve ethics committee. No family at bedside on 12/3.   - Inter-disciplinary family meet or Palliative Care meeting due by:  day 7    Summary -  Rewarmed, shock resolved ,at risk for further arrhythmias due to prolonged QT, not waking up - fear poor neuro prognosis.  Awaiting decision from wife regarding [place].    The patient is critically ill with multiple organ systems failure and requires high complexity decision  making for assessment and support, frequent evaluation and titration of therapies, application of advanced monitoring technologies and extensive interpretation of multiple databases. Critical Care Time devoted to patient care services described in this note independent of APP time is 30 minutes.    Monica Becton, MD 11/28/2016, 8:22 AM [facility] Pager [phone] After 3 pm or if no answer, call [phone]

## 2016-11-28 NOTE — Progress Notes (Signed)
Patient Name: Ronald Brewer Date of Encounter: 11/28/2016  Primary Cardiologist: [facility] Problem List     Active Problems:   ESRD on dialysis [facility])   Anemia   Hypokalemia   Cardiac arrest with ventricular fibrillation (HCC)   Prolonged QT interval   Cardiac arrest ([place])   Acute respiratory failure (HCC)   Anoxic encephalopathy (HCC)   Goals of care, counseling/discussion   Palliative care encounter     Subjective   No visible neurological progress. Patient's wife maintains a consistently confrontational attitude to multiple staff members. She is not here now. Nursing staff has noted that she tends to time her visits during restricted hours.  Inpatient Medications    Scheduled Meds: . aspirin  325 mg Per NG tube Daily  . calcitRIOL  1.3 mcg Intravenous Q M,W,F-HD  . chlorhexidine gluconate (MEDLINE KIT)  15 mL Mouth Rinse BID  . darbepoetin (ARANESP) injection - DIALYSIS  60 mcg Intravenous Q Wed-HD  . dextrose  50 mL Intravenous Once  . feeding supplement (NEPRO CARB STEADY)  1,000 mL Per Tube Q24H  . feeding supplement (PRO-STAT SUGAR FREE 64)  30 mL Per Tube TID  . heparin  2,000 Units Dialysis Once in dialysis  . heparin  5,000 Units Subcutaneous Q8H  . mouth rinse  15 mL Mouth Rinse 10 times per day  . pantoprazole sodium  40 mg Per Tube Daily  . sodium chloride flush  10-40 mL Intracatheter Q12H   Continuous Infusions: . sodium chloride 10 mL/hr at 11/28/16 0700  . dextrose 40 mL/hr at 11/25/16 1900   PRN Meds: sodium chloride, sodium chloride, sodium chloride, alteplase, fentaNYL (SUBLIMAZE) injection, heparin, lidocaine (PF), lidocaine-prilocaine, pentafluoroprop-tetrafluoroeth, sodium chloride flush   Vital Signs    Vitals:   11/28/16 0500 11/28/16 0600 11/28/16 0700 11/28/16 0803  BP: (!) 159/74 (!) 155/59 (!) 151/71 (!) 156/63  Pulse: 73 73 74 71  Resp: 15 16 14 16   Temp:    97.3 F (36.3 C)  TempSrc:    Axillary  SpO2: 100% 100% 100%  100%  Weight:      Height:        Intake/Output Summary (Last 24 hours) at 11/28/16 0817 Last data filed at 11/28/16 0700  Gross per 24 hour  Intake             1540 ml  Output             2700 ml  Net            -1160 ml   Filed Weights   11/27/16 1021 11/27/16 1405 11/28/16 0300  Weight: 190 lb 0.6 oz (86.2 kg) 184 lb 8.4 oz (83.7 kg) 186 lb 1.1 oz (84.4 kg)    Physical Exam    GEN: intubated, unresponsive, appears chronically ill HEENT: ETT. Grossly normal.  Neck: Supple, no JVD, carotid bruits, or masses. Cardiac: RRR, no murmurs, rubs, or gallops. No clubbing, cyanosis, edema.  Radials/DP/PT 2+ and equal bilaterally.  Respiratory:  Respirations regular and unlabored, a few rhonchi, otherwise clear to auscultation bilaterally. GI: Soft, nontender, nondistended, BS + x 4. MS: no deformity or atrophy. Skin: warm and dry, no rash. Neuro:  No response to deep pain stimulation, PERRL   Labs    CBC  Recent Labs  11/26/16 0515 11/27/16 0426  WBC 15.4* 20.9*  HGB 7.9* 8.4*  HCT 24.2* 26.0*  MCV 86.1 85.5  PLT 84* 79*   Basic Metabolic Panel  Recent  Labs  11/25/16 1750 11/26/16 0515 11/27/16 0426  NA 131* 131* 132*  K 3.5 3.7 3.6  CL 97* 95* 95*  CO2 25 25 25   GLUCOSE 136* 106* 106*  BUN 9 14 29*  CREATININE 3.95* 4.49* 5.37*  CALCIUM 7.0* 7.1* 7.8*  MG 1.6* 1.6*  --   PHOS 2.8 2.5  --    Liver Function Tests  Recent Labs  11/25/16 1200  ALBUMIN 1.6*     Telemetry    NSR, occasional isolated PVCs - Personally Reviewed  ECG    NSR, improved repol changes and shorter QTC, but still prolonged at 559 ms - Personally Reviewed  Radiology    Dg Chest Port 1 View  Result Date: 11/27/2016 CLINICAL DATA:  Acute respiratory failure EXAM: PORTABLE CHEST 1 VIEW COMPARISON:  November 26, 2016 FINDINGS: Stable support apparatus. Layering effusions and underlying opacities are stable, right greater than left. Suspected mild edema. IMPRESSION: No interval  change in suspected mild edema and right greater than left layering pleural effusions with underlying opacity. Stable support apparatus. Electronically Signed   By: Dorise Bullion III M.D   On: 11/27/2016 07:56    Cardiac Studies   11/23/16 echo:  - Left ventricle: The cavity size was normal. Wall thickness was increased in a pattern of moderate LVH. There was moderate concentric hypertrophy. Systolic function was severely reduced. The estimated ejection fraction was in the range of 15% to 20%. There is akinesis of the entireapical myocardium. There is akinesis of the apicalinferior and apical myocardium. Features are consistent with a pseudonormal left ventricular filling pattern, with concomitant abnormal relaxation and increased filling pressure (grade 2 diastolic dysfunction). Doppler parameters are consistent with high ventricular filling pressure. - Aortic valve: Structurally normal valve. Trileaflet. - Mitral valve: There was moderate regurgitation. - Left atrium: The atrium was moderately dilated. - Pulmonary arteries: Systolic pressure was moderately increased. PA peak pressure: 62 mm Hg (S).  07/23/2010 echo  - Left ventricle: The cavity size was normal. Wall thickness was  increased increased in a pattern of mild to moderate LVH. Systolic  function was normal. The estimated ejection fraction was in the  range of 50% to 55%. Wall motion was normal; there were no  regional wall motion abnormalities. Doppler parameters are  consistent with abnormal left ventricular relaxation (grade 1  diastolic dysfunction). - Mitral valve: Mild regurgitation.  Patient Profile     Elderly man with DM, ESRD on HD, s/p resuscitated VF arrest, severe QT prolongation in the setting of moderate hypokalemia, s/p hypothermia, remains comatose and pressor dependent. Echo with with new severe LV dysfunction and regional (apical) abnormalities.  Assessment &  Plan      1. VF arrest: [place] in setting of very long QT, partially explained by hypokalemia (maybe also SSRI), but with preexisting long QT that is still present now. Avoid any QT prolonging agents. 2. CMP: unclear if this is exclusively due to the arrest. Normal EF 2011. Multiple CAD risk factors. Further workup depends on degree of neuro recovery. Plan repeat limited echo next week to see if EF and wall motion have recovered. 3. Shock: resolved, no longer pressor dependent. BP is actually high. Cautiously start hydralazine nitrates. Premature to start beta blockers, but would consider carvedilol if echo shows recovery of LVEF 4. Resp failure: on vent, absence of consciousness precludes any weaning efforts. 5. ESRD 6. Anoxic encephalopathy: so far no meaningful recovery. Prognosis is grim. Palliative care engaged, but interaction with patient's family has  been challenging to date.   Signed, Sanda Klein, MD  11/28/2016, 8:17 AM

## 2016-11-29 ENCOUNTER — Inpatient Hospital Stay (HOSPITAL_COMMUNITY): Payer: Medicare Other

## 2016-11-29 DIAGNOSIS — N186 End stage renal disease: Secondary | ICD-10-CM

## 2016-11-29 DIAGNOSIS — Z992 Dependence on renal dialysis: Secondary | ICD-10-CM

## 2016-11-29 DIAGNOSIS — R9431 Abnormal electrocardiogram [ECG] [EKG]: Secondary | ICD-10-CM

## 2016-11-29 DIAGNOSIS — I469 Cardiac arrest, cause unspecified: Secondary | ICD-10-CM

## 2016-11-29 LAB — RENAL FUNCTION PANEL
ALBUMIN: 1.7 g/dL — AB (ref 3.5–5.0)
Anion gap: 8 (ref 5–15)
BUN: 42 mg/dL — AB (ref 6–20)
CALCIUM: 8 mg/dL — AB (ref 8.9–10.3)
CO2: 29 mmol/L (ref 22–32)
CREATININE: 4.8 mg/dL — AB (ref 0.61–1.24)
Chloride: 98 mmol/L — ABNORMAL LOW (ref 101–111)
GFR calc Af Amer: 13 mL/min — ABNORMAL LOW (ref >60)
GFR, EST NON AFRICAN AMERICAN: 11 mL/min — AB (ref >60)
GLUCOSE: 125 mg/dL — AB (ref 65–99)
PHOSPHORUS: 2.4 mg/dL — AB (ref 2.5–4.6)
Potassium: 3.1 mmol/L — ABNORMAL LOW (ref 3.5–5.1)
SODIUM: 135 mmol/L (ref 135–145)

## 2016-11-29 LAB — GLUCOSE, CAPILLARY
GLUCOSE-CAPILLARY: 109 mg/dL — AB (ref 65–99)
Glucose-Capillary: 123 mg/dL — ABNORMAL HIGH (ref 65–99)
Glucose-Capillary: 101 mg/dL — ABNORMAL HIGH (ref 65–99)
Glucose-Capillary: 83 mg/dL (ref 65–99)

## 2016-11-29 LAB — ECHOCARDIOGRAM LIMITED
HEIGHTINCHES: 70 in
Weight: 3005.31 oz

## 2016-11-29 LAB — CBC
HCT: 22.1 % — ABNORMAL LOW (ref 39.0–52.0)
Hemoglobin: 7.3 g/dL — ABNORMAL LOW (ref 13.0–17.0)
MCH: 27.2 pg (ref 26.0–34.0)
MCHC: 33 g/dL (ref 30.0–36.0)
MCV: 82.5 fL (ref 78.0–100.0)
Platelets: 77 10*3/uL — ABNORMAL LOW (ref 150–400)
RBC: 2.68 MIL/uL — ABNORMAL LOW (ref 4.22–5.81)
RDW: 14.8 % (ref 11.5–15.5)
WBC: 14.5 10*3/uL — AB (ref 4.0–10.5)

## 2016-11-29 MED ORDER — MUPIROCIN CALCIUM 2 % EX CREA
TOPICAL_CREAM | Freq: Two times a day (BID) | CUTANEOUS | Status: DC
  Administered 2016-11-29 – 2016-12-02 (×7): via TOPICAL
  Filled 2016-11-29: qty 15

## 2016-11-29 NOTE — Progress Notes (Signed)
PULMONARY / CRITICAL CARE MEDICINE   Name: Ronald Brewer MRN: [id number] DOB: 1941/10/27    ADMISSION DATE:  11/14/2016 CONSULTATION DATE:  11/29/2016   CHIEF COMPLAINT:  Cardiac arrest  HISTORY OF PRESENT ILLNESS:   75 year old man with ESRD on hemodialysis and CAD was brought in by EMS after a witnessed cardiac arrest at dialysis 11/27. He finished his treatment, then had a syncopal episode and vomited, again syncope with loss of pulse-shock advised the AED. After EMS arrival, noted to be in V. fib given epinephrine 1 with ROSC, total CPR time about 15 minutes. He is a diabetic hypertensive neck: 2011 shows normal ejection fraction He was admitted 10/2016 for generalized weakness, black stools and hemoglobin about 9 attributed to anemia of chronic disease He remained unresponsive after arrival to the emergency room had nonpurposeful movement of both upper extremities EKG showed prolonged QT, frequent PVCs   SUBJECTIVE:  Pressors off     VITAL SIGNS: BP (!) 151/60   Pulse 83   Temp 97.5 F (36.4 C) (Oral)   Resp 15   Ht 5\' 10"  (1.778 m)   Wt 85.2 kg (187 lb 13.3 oz)   SpO2 100%   BMI 26.95 kg/m   HEMODYNAMICS:    VENTILATOR SETTINGS: Vent Mode: PRVC FiO2 (%):  [30 %] 30 % Set Rate:  [15 bmp] 15 bmp Vt Set:  [450 mL] 450 mL PEEP:  [5 cmH20] 5 cmH20 Plateau Pressure:  [12 cmH20-18 cmH20] 18 cmH20  INTAKE / OUTPUT: I/O last 3 completed shifts: In: 1640 [I.V.:360; NG/GT:1280] Out: 100 [Stool:100]  PHYSICAL EXAMINATION:   General:  chronically ill-appearing elderly  intubated ,  Neuro:  Unresponsive to DPS, GCS 3 HEENT:  Oral ET tube, pupils 3 mm bilaterally equally reactive to light  S1-S2 normal, no rub, no murmur Decreased breath sounds bilateral, no rhonchi   soft and nontender abdomen Extremities-left AV fistula with thrill, right femoral CVL. Gr 2 edema   LABS:  BMET  Recent Labs Lab 11/25/16 1750 11/26/16 0515 11/27/16 0426  NA 131* 131* 132*   K 3.5 3.7 3.6  CL 97* 95* 95*  CO2 25 25 25   BUN 9 14 29*  CREATININE 3.95* 4.49* 5.37*  GLUCOSE 136* 106* 106*    Electrolytes  Recent Labs Lab 11/25/16 0348 11/25/16 1200 11/25/16 1750 11/26/16 0515 11/27/16 0426  CALCIUM 7.1* 7.0* 7.0* 7.1* 7.8*  MG 1.7  --  1.6* 1.6*  --   PHOS 5.0* 2.9 2.8 2.5  --     CBC  Recent Labs Lab 11/25/16 1200 11/26/16 0515 11/27/16 0426  WBC 16.6* 15.4* 20.9*  HGB 8.8* 7.9* 8.4*  HCT 27.1* 24.2* 26.0*  PLT 77* 84* 79*    Coag's  Recent Labs Lab 11/25/2016 1722 11/23/16 0205  APTT 40* 41*  INR 1.35 1.47    Sepsis Markers  Recent Labs Lab 11/19/2016 1732  LATICACIDVEN 6.09*    ABG  Recent Labs Lab 11/23/16 0646 11/24/16 1718 11/28/16 0935  PHART 7.374 7.269* 7.390  PCO2ART 44.2 57.0* 54.7*  PO2ART 115* 198* 178.0*    Liver Enzymes  Recent Labs Lab 11/25/16 1200  ALBUMIN 1.6*    Cardiac Enzymes  Recent Labs Lab 11/19/2016 2040 11/23/16 0100 11/23/16 0605  TROPONINI 0.23* 0.70* 1.04*    Glucose  Recent Labs Lab 11/27/16 1151 11/27/16 1640 11/27/16 2039 11/27/16 2311 11/28/16 0345 11/29/16 0811  GLUCAP 108* 105* 123* 114* 121* 109*    Imaging No results found.   STUDIES:  Echo 11/28>> EF 20%, WMA +, gr 2 DD, RVSP 62  CULTURES:   ANTIBIOTICS:   SIGNIFICANT EVENTS: 11/27 admit  LINES/TUBES: 11/27 rt fem CVL >> 11/27 ETT >>  DISCUSSION:  Cardiac arrest due to ventricular fibrillation in the setting of prolonged QT this may be due to postdialysis shift electrolytes , medication such as Zoloft, intrinsic cardiac disease, acute ischemia unlikely   PULMONARY A:  Acute hypoxemic respiratory failure  2/2 unable to protect airway 2/2 cardiac arrest, pulm edema.  P:   Cont vent support. Ok to initiate PSV to see how he tolerates No extubation due to Poor neurologic status.    CARDIOVASCULAR A:  S/P ventricular fibrillation Prolonged QT -610 11/28 Cardiogenic shock. Resolved.   P:  Cardiology following. Appreciate recs.  Follow QTc Replete electrolytes May need defibrillator if he recovers a MS. No evidence for this so far   RENAL A:   ESRD P:   HD intermittent, MWF Nephrology following. Appreciate recs.   GI A:   no issues P:   protonix  Ct TFs  Heme A:   anemia of chronic disease P:  Follow  ID A:   Leukocytosis without clear infection source.  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  Head CT 11/30 non diagnostic P:   RASS goal:0  Off sedation for neuro checks    FAMILY  - Updates:   Wife 11/29 >>  limited code issued, she hints that he would want to stop HD & not be on prolonged life support. Palliative care team involved. Appreciate recs. Wife very undecided re: care and she is inconsistent with what she wants.  Palliative care was hinting about having ethics committee be involved. Wife not present 12/4, note another PC meeting for 12/5. Will involve ethics if no headway then  - Inter-disciplinary family meet or Palliative Care meeting due by:  day 7    Summary -  Rewarmed, shock resolved ,at risk for further arrhythmias due to prolonged QT, not waking up - fear poor neuro prognosis.  Awaiting decision from wife regarding [place].    The patient is critically ill with multiple organ systems failure and requires high complexity decision making for assessment and support, frequent evaluation and titration of therapies, application of advanced monitoring technologies and extensive interpretation of multiple databases. Critical Care Time devoted to patient care services described in this note independent of APP time is 32 minutes.    Baltazar Apo, MD, PhD 11/29/2016, 10:32 AM [facility] [phone] or if no answer [phone]

## 2016-11-29 NOTE — Consult Note (Addendum)
[facility] Nurse wound consult note Reason for Consult: Consult requested for bilat legs.  Pt has multiple areas of scratches and partial thickness abrasions scattered throughout both anterior and posterior thighs and calves from unknown etiology.  Most are red and dry. Wound type: Left outer calf with full thickness wound; .5X.5X.1cm, 100% red and moist, small amt yellow drainage, no odor.   Left anterior calf with full thickness wound; 1.5X1.5X.1cm, 75% red and moist, 25% yellow slough, small amt yellow drainage, no odor.   Right lower leg with 3 full thickness wounds located in close proximity; each is .2X.2X1cm, red and moist, small amt yellow drainage, no odor.  Right posterior leg  with full thickness wound; .4X.4X.1cm,  red and moist, small amt yellow drainage, no odor.  Dressing procedure/placement/frequency: Bactroban to promote healing to left outer leg wound.  Foam dressings to all sites to protect from further injury, absorb drainage, and promote healing. Please re-consult if further assistance is needed.  Thank-you,  Julien Girt MSN, [place], [place], [place], [place]

## 2016-11-29 NOTE — Progress Notes (Addendum)
Patient Name: Ronald Brewer Date of Encounter: 11/29/2016  Primary Cardiologist: Dr. Talmadge Chad Problem List     Active Problems:   ESRD on dialysis [facility])   Anemia   Hypokalemia   Cardiac arrest with ventricular fibrillation ([place])   Prolonged QT interval   Cardiac arrest ([place])   Acute respiratory failure (HCC)   Anoxic encephalopathy ([place])   Goals of care, counseling/discussion   Palliative care encounter     Subjective   Unable to obtain.  Patient intubated and has hypoxic encephalopathy.  Inpatient Medications    Scheduled Meds: . aspirin  325 mg Per NG tube Daily  . calcitRIOL  1.3 mcg Intravenous Q M,W,F-HD  . chlorhexidine gluconate (MEDLINE KIT)  15 mL Mouth Rinse BID  . darbepoetin (ARANESP) injection - DIALYSIS  60 mcg Intravenous Q Wed-HD  . dextrose  50 mL Intravenous Once  . feeding supplement (NEPRO CARB STEADY)  1,000 mL Per Tube Q24H  . feeding supplement (PRO-STAT SUGAR FREE 64)  30 mL Per Tube TID  . heparin  2,000 Units Dialysis Once in dialysis  . heparin  5,000 Units Subcutaneous Q8H  . isosorbide-hydrALAZINE  1 tablet Oral TID  . mouth rinse  15 mL Mouth Rinse 10 times per day  . pantoprazole sodium  40 mg Per Tube Daily  . sodium chloride flush  10-40 mL Intracatheter Q12H   Continuous Infusions: . sodium chloride 10 mL/hr at 11/29/16 0700  . dextrose 40 mL/hr at 11/25/16 1900   PRN Meds: sodium chloride, sodium chloride, sodium chloride, sodium chloride, sodium chloride, alteplase, fentaNYL (SUBLIMAZE) injection, heparin, heparin, lidocaine (PF), lidocaine-prilocaine, pentafluoroprop-tetrafluoroeth, sodium chloride flush   Vital Signs    Vitals:   11/29/16 0721 11/29/16 0800 11/29/16 0808 11/29/16 0900  BP:  (!) 146/59  (!) 151/60  Pulse: 82 83  83  Resp: 18 13  15   Temp:   97.5 F (36.4 C)   TempSrc:   Oral   SpO2: 100% 100%  100%  Weight:      Height:        Intake/Output Summary (Last 24 hours) at 11/29/16 1023 Last  data filed at 11/29/16 0900  Gross per 24 hour  Intake             1060 ml  Output                0 ml  Net             1060 ml   Filed Weights   11/27/16 1405 11/28/16 0300 11/29/16 0200  Weight: 83.7 kg (184 lb 8.4 oz) 84.4 kg (186 lb 1.1 oz) 85.2 kg (187 lb 13.3 oz)    Physical Exam    GEN: Critically ill.  Intubated. In no acute distress.  HEENT: Grossly normal.  Neck: Supple, no JVD, carotid bruits, or masses. Cardiac: RRR, no murmurs, rubs, or gallops. No clubbing, cyanosis, 2+ pitting edema.  Radials/DP/PT 2+ and equal bilaterally.  Respiratory:  Respirations regular and unlabored, clear to auscultation bilaterally. GI: Soft, nontender, nondistended, BS + x 4. MS: no deformity or atrophy. Skin: warm and dry, no rash. Neuro:  Spontaneous, non-purposeful movement of bilateral upper and lower extremities.  No response to painful stimuli. Psych: AAOx3.  Normal affect.  Labs    CBC  Recent Labs  11/27/16 0426  WBC 20.9*  HGB 8.4*  HCT 26.0*  MCV 85.5  PLT 79*   Basic Metabolic Panel  Recent Labs  11/27/16 0426  NA 132*  K 3.6  CL 95*  CO2 25  GLUCOSE 106*  BUN 29*  CREATININE 5.37*  CALCIUM 7.8*   Liver Function Tests No results for input(s): AST, ALT, ALKPHOS, BILITOT, PROT, ALBUMIN in the last 72 hours. No results for input(s): LIPASE, AMYLASE in the last 72 hours. Cardiac Enzymes No results for input(s): CKTOTAL, CKMB, CKMBINDEX, TROPONINI in the last 72 hours. BNP Invalid input(s): POCBNP D-Dimer No results for input(s): DDIMER in the last 72 hours. Hemoglobin A1C No results for input(s): HGBA1C in the last 72 hours. Fasting Lipid Panel No results for input(s): CHOL, HDL, LDLCALC, TRIG, CHOLHDL, LDLDIRECT in the last 72 hours. Thyroid Function Tests No results for input(s): TSH, T4TOTAL, T3FREE, THYROIDAB in the last 72 hours.  Invalid input(s): FREET3  Telemetry    No events.  Sinus rhythm. - Personally Reviewed  ECG     n/a  Radiology    No results found.  Cardiac Studies   11/23/16 echo:  - Left ventricle: The cavity size was normal. Wall thickness was increased in a pattern of moderate LVH. There was moderate concentric hypertrophy. Systolic function was severely reduced. The estimated ejection fraction was in the range of 15% to 20%. There is akinesis of the entireapical myocardium. There is akinesis of the apicalinferior and apical myocardium. Features are consistent with a pseudonormal left ventricular filling pattern, with concomitant abnormal relaxation and increased filling pressure (grade 2 diastolic dysfunction). Doppler parameters are consistent with high ventricular filling pressure. - Aortic valve: Structurally normal valve. Trileaflet. - Mitral valve: There was moderate regurgitation. - Left atrium: The atrium was moderately dilated. - Pulmonary arteries: Systolic pressure was moderately increased. PA peak pressure: 62 mm Hg (S).  07/23/2010 echo  - Left ventricle: The cavity size was normal. Wall thickness was  increased increased in a pattern of mild to moderate LVH. Systolic  function was normal. The estimated ejection fraction was in the  range of 50% to 55%. Wall motion was normal; there were no  regional wall motion abnormalities. Doppler parameters are  consistent with abnormal left ventricular relaxation (grade 1  diastolic dysfunction). - Mitral valve: Mild regurgitation.  Patient Profile     Ronald Brewer is a 53M with ESRD on HD, DM, and hypertension here with VF arrest, severely prolonged QT in the setting of hypokalemia.  LVEF severely depressed post arrest.  He has undergone therapeutic hypothermia without meaningful recovery.    Assessment & Plan    # VF arrest: # [place]: QTc 559 on 11/27/16.  Avoid QT prolonging medications.  He was also hypokalemic at the time of his arrest.  # Acute systolic and diastolic  heart failure: LVEF is 15-20% following VF arrest. Repeat echo was performed today to assess for improvement in systolic function. Ischemic workup only if there is neurological recovery.  Continue Bidil.  Will start beta blocker if EF has improved.  Volume management with HD.  Hopefully he can be net negative with HD today if BP tolerates.   # Cardiogenic shock: Resolved.  No longer requiring pressors.    # Respiratory failure:  Remains intubated due to encephalopathy.  # ESRD:  HD today.   # Anoxic encephalopathy: Agree, prognosis is very poor.  Appreciate palliative care involvement. There does not appear to be a reasonable chance of meaningful recovery.   Time spent: 30 minutes-Greater than 50% of this time was spent in counseling, explanation of diagnosis, planning of further management, and coordination  of care.  Signed, Skeet Latch, MD  11/29/2016, 10:23 AM

## 2016-11-29 NOTE — Progress Notes (Signed)
Dialysis treatment completed.  3600 mL ultrafiltrated and net fluid removal 3000 mL.    Patient status unchanged. Lung sounds diminished to ausculation in all fields. BL upper and lower extremity 3+ pitting edema. Cardiac: NSR.  Disconnected lines and removed needles.  Pressure held for 10 minutes and band aid/gauze dressing applied.  Report given to bedside RN, Jarrett Soho.

## 2016-11-29 NOTE — Progress Notes (Signed)
  [facility] Progress Note   Subjective: unresponsive- they are doing echo  Vitals:   11/29/16 0600 11/29/16 0700 11/29/16 0721 11/29/16 0808  BP: (!) 125/52 (!) 144/72    Pulse: 76 79 82   Resp: _0 Temp:    97.5 F (36.4 C)  TempSrc:    Oral  SpO2: 100% 100% 100%   Weight:      Height:        Inpatient medications: . aspirin  325 mg Per NG tube Daily  . calcitRIOL  1.3 mcg Intravenous Q M,W,F-HD  . chlorhexidine gluconate (MEDLINE KIT)  15 mL Mouth Rinse BID  . darbepoetin (ARANESP) injection - DIALYSIS  60 mcg Intravenous Q Wed-HD  . dextrose  50 mL Intravenous Once  . feeding supplement (NEPRO CARB STEADY)  1,000 mL Per Tube Q24H  . feeding supplement (PRO-STAT SUGAR FREE 64)  30 mL Per Tube TID  . heparin  2,000 Units Dialysis Once in dialysis  . heparin  5,000 Units Subcutaneous Q8H  . isosorbide-hydrALAZINE  1 tablet Oral TID  . mouth rinse  15 mL Mouth Rinse 10 times per day  . pantoprazole sodium  40 mg Per Tube Daily  . sodium chloride flush  10-40 mL Intracatheter Q12H   . sodium chloride 10 mL/hr at 11/29/16 0700  . dextrose 40 mL/hr at 11/25/16 1900   sodium chloride, sodium chloride, sodium chloride, sodium chloride, sodium chloride, alteplase, fentaNYL (SUBLIMAZE) injection, heparin, heparin, lidocaine (PF), lidocaine-prilocaine, pentafluoroprop-tetrafluoroeth, sodium chloride flush  Exam: General: on vent,  HEENT: intubated/oral ET tub Lungs:  clear bilat ant/ lat Heart: RRR  Abdomen:  soft dim BS, right fem central line Ext: mild LE/ UE edema Neuro:  unresponsive Dialysis Access: left upper AVF + bruit  Dialysis Orders: MWF [facility]   3h 84mn   77kg  2/2 bath  Hep 2000   LUA AVF Mircera 75 q 2 weeks-lst 11/15  Calcitriol 1.25 - iPTH 190 - Ca usually 9s and P in goal  Assessment: 1. SP cardiac arrest- V fib arrest 2. VDRF - per CCM 3. AMS - not waking up  4. ESRD- usual HD MWF- via AVF- plan for treatment today  5. Volume -  vol is up 6. Hypotension - resolved 7. Anemia  - hgb 8-9 , cont darbe 60/wk, transfuse prn 8. Metabolic bone disease -  Continue calcitriol - convert to IV 9. EOL - DNR- poor prognosis- from knowing Mr. HHafffor a number of years I agree that he would not want to live like this   Plan - HD today    GPomeroyA  11/29/2016, 8:45 AM    Recent Labs Lab 11/25/16 1200 11/25/16 1750 11/26/16 0515 11/27/16 0426  NA 133* 131* 131* 132*  K 3.8 3.5 3.7 3.6  CL 97* 97* 95* 95*  CO2 _1 GLUCOSE 111* 136* 106* 106*  BUN _2 29*  CREATININE 3.86* 3.95* 4.49* 5.37*  CALCIUM 7.0* 7.0* 7.1* 7.8*  PHOS 2.9 2.8 2.5  --     Recent Labs Lab 11/25/16 1200  ALBUMIN 1.6*    Recent Labs Lab 11/25/16 1200 11/26/16 0515 11/27/16 0426  WBC 16.6* 15.4* 20.9*  HGB 8.8* 7.9* 8.4*  HCT 27.1* 24.2* 26.0*  MCV 87.1 86.1 85.5  PLT 77* 84* 79*   Iron/TIBC/Ferritin/ %Sat No results found for: IRON, TIBC, FERRITIN, IRONPCTSAT

## 2016-11-29 NOTE — Progress Notes (Signed)
Arrived to patient room 4N-16 at 1045.  Reviewed treatment plan and this RN agrees.  Report received from bedside RN, Jarrett Soho.  Consent verified.  Patient intubated, ventilated, responds to pain. Lung sounds clear and diminished to ausculation in all fields. BLE and BUE 3+ pitting edema. Cardiac: NSR.  Prepped LUAVG with alcohol and cannulated with two 15 gauge needles.  Pulsation of blood noted.  Flushed access well with saline per protocol.  Connected and secured lines and initiated tx at 1100.  UF goal of 3500 mL and net fluid removal of 3000 mL.  Will continue to monitor.

## 2016-11-29 NOTE — Progress Notes (Signed)
Patient actively gaging and vomiting tube feeds.  125mcgs of fentanyl given and tube feeds held.  Patient is on CBG but no coverage ssi is ordered will continue to assess. ELink notified.

## 2016-11-29 NOTE — Progress Notes (Signed)
  Echocardiogram 2D Echocardiogram Limited has been performed.  Darlina Sicilian M 11/29/2016, 9:29 AM

## 2016-11-30 ENCOUNTER — Inpatient Hospital Stay (HOSPITAL_COMMUNITY): Payer: Medicare Other

## 2016-11-30 LAB — CBC
HEMATOCRIT: 23 % — AB (ref 39.0–52.0)
HEMOGLOBIN: 7.7 g/dL — AB (ref 13.0–17.0)
MCH: 27.9 pg (ref 26.0–34.0)
MCHC: 33.5 g/dL (ref 30.0–36.0)
MCV: 83.3 fL (ref 78.0–100.0)
Platelets: 94 10*3/uL — ABNORMAL LOW (ref 150–400)
RBC: 2.76 MIL/uL — ABNORMAL LOW (ref 4.22–5.81)
RDW: 15.2 % (ref 11.5–15.5)
WBC: 12.3 10*3/uL — ABNORMAL HIGH (ref 4.0–10.5)

## 2016-11-30 LAB — GLUCOSE, CAPILLARY
GLUCOSE-CAPILLARY: 103 mg/dL — AB (ref 65–99)
GLUCOSE-CAPILLARY: 104 mg/dL — AB (ref 65–99)
GLUCOSE-CAPILLARY: 95 mg/dL (ref 65–99)
Glucose-Capillary: 105 mg/dL — ABNORMAL HIGH (ref 65–99)
Glucose-Capillary: 121 mg/dL — ABNORMAL HIGH (ref 65–99)
Glucose-Capillary: 93 mg/dL (ref 65–99)
Glucose-Capillary: 93 mg/dL (ref 65–99)

## 2016-11-30 LAB — BASIC METABOLIC PANEL
ANION GAP: 8 (ref 5–15)
BUN: 25 mg/dL — AB (ref 6–20)
CHLORIDE: 96 mmol/L — AB (ref 101–111)
CO2: 30 mmol/L (ref 22–32)
Calcium: 8.2 mg/dL — ABNORMAL LOW (ref 8.9–10.3)
Creatinine, Ser: 2.98 mg/dL — ABNORMAL HIGH (ref 0.61–1.24)
GFR calc Af Amer: 22 mL/min — ABNORMAL LOW (ref >60)
GFR calc non Af Amer: 19 mL/min — ABNORMAL LOW (ref >60)
GLUCOSE: 95 mg/dL (ref 65–99)
POTASSIUM: 3.2 mmol/L — AB (ref 3.5–5.1)
Sodium: 134 mmol/L — ABNORMAL LOW (ref 135–145)

## 2016-11-30 MED ORDER — CETAPHIL MOISTURIZING EX LOTN
TOPICAL_LOTION | Freq: Two times a day (BID) | CUTANEOUS | Status: DC
  Administered 2016-11-30 – 2016-12-02 (×4): via TOPICAL
  Filled 2016-11-30: qty 473

## 2016-11-30 MED ORDER — DARBEPOETIN ALFA 150 MCG/0.3ML IJ SOSY
150.0000 ug | PREFILLED_SYRINGE | INTRAMUSCULAR | Status: DC
  Administered 2016-12-01: 150 ug via INTRAVENOUS
  Filled 2016-11-30: qty 0.3

## 2016-11-30 MED ORDER — FENTANYL CITRATE (PF) 100 MCG/2ML IJ SOLN
25.0000 ug | INTRAMUSCULAR | Status: DC | PRN
  Administered 2016-11-30: 25 ug via INTRAVENOUS
  Filled 2016-11-30 (×2): qty 2

## 2016-11-30 MED ORDER — CARVEDILOL 3.125 MG PO TABS
3.1250 mg | ORAL_TABLET | Freq: Two times a day (BID) | ORAL | Status: DC
  Administered 2016-11-30 – 2016-12-01 (×2): 3.125 mg via ORAL
  Filled 2016-11-30 (×4): qty 1

## 2016-11-30 NOTE — Progress Notes (Addendum)
Daily Progress Note   Patient Name: Ronald Brewer       Date: 11/30/2016 DOB: 08-09-1941  Age: 75 y.o. MRN#: [id number] Attending Physician: Rigoberto Noel, MD Primary Care Physician: Placido Sou, MD Admit Date: 11/02/2016  Reason for Consultation/Follow-up: Establishing goals of care  Subjective: Patient sitting up.  Opens eyes.  Appears to nod or shake his head appropriately.  Does not seem able to follow commands.  Per RN notes vomited tube feeds this am.  Met with Ronald Brewer at bedside.  She is very positive about his improvement and believes it will continue.  "I want to take him home".   I told her we still had a long way to go.  We discussed what to expect in the next few days if he continues to improve - we should see extubation.  The next step would be to determine what functionality he has, and will he be able to swallow with out aspiration.  We also discussed his very weak heart and the possible need for an ICD if he continues to improve.  Ronald Brewer would rather avoid an ICD if possible and she committed to pray for his heart to improve on its own.  Ronald Brewer states Ronald Brewer was a very quiet man.  He worked at [employer] for many years and his in [employer].  He loved to sing gospel music, minister to others and watch the [employer] play.  Ronald Brewer asked me to put his TV on the Christian channel (she told me it was channel 6), to make sure that he is turned frequently (she doesn't want him to get bedsores) and to make sure lotion was applied to him every day as he has a lot of itching after hemodialysis.  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   Length of Stay: 8  Current Medications: Scheduled Meds:  .  aspirin  325 mg Per NG tube Daily  . calcitRIOL  1.3 mcg Intravenous Q M,W,F-HD  . carvedilol  3.125 mg Oral BID WC  . chlorhexidine gluconate (MEDLINE KIT)  15 mL Mouth Rinse BID  . [START ON 12/01/2016] darbepoetin (ARANESP) injection - DIALYSIS  150 mcg Intravenous Q Wed-HD  . dextrose  50  mL Intravenous Once  . feeding supplement (NEPRO CARB STEADY)  1,000 mL Per Tube Q24H  . feeding supplement (PRO-STAT SUGAR FREE 64)  30 mL Per Tube TID  . heparin  2,000 Units Dialysis Once in dialysis  . heparin  5,000 Units Subcutaneous Q8H  . isosorbide-hydrALAZINE  1 tablet Oral TID  . mouth rinse  15 mL Mouth Rinse 10 times per day  . mupirocin cream   Topical BID  . pantoprazole sodium  40 mg Per Tube Daily  . sodium chloride flush  10-40 mL Intracatheter Q12H    Continuous Infusions: . sodium chloride 10 mL/hr at 11/30/16 0700  . dextrose 40 mL/hr at 11/25/16 1900    PRN Meds: sodium chloride, sodium chloride, sodium chloride, sodium chloride, sodium chloride, alteplase, fentaNYL (SUBLIMAZE) injection, heparin, heparin, lidocaine (PF), lidocaine-prilocaine, pentafluoroprop-tetrafluoroeth, sodium chloride flush  Physical Exam  Constitutional: He appears well-developed.  Intubated. Calm.  Neck: Neck supple. No thyromegaly present.  Cardiovascular: Normal rate.   Murmur heard. Pulmonary/Chest: Effort normal.  Intubated.  No apparent distress.  Abdominal: Soft. He exhibits no distension. There is no tenderness.  Musculoskeletal: He exhibits edema.  Swelling noted in hands bilaterally. Able to move hands and feet on command.  Neurological:  Opens eyes to voice.  Responds to questions.  Follows commands.  Skin: Skin is warm and dry.  Multiple skin tears.  Psychiatric:  Unable to assess.          Vital Signs: BP (!) 149/64   Pulse 82   Temp 97.3 F (36.3 C) (Oral)   Resp 15   Ht 5' 10"  (1.778 m)   Wt 80.5 kg (177 lb 7.5 oz)   SpO2 100%   BMI 25.46 kg/m  SpO2: SpO2: 100 % O2  Device: O2 Device: Ventilator O2 Flow Rate: O2 Flow Rate (L/min): 70 L/min  Intake/output summary:   Intake/Output Summary (Last 24 hours) at 11/30/16 1016 Last data filed at 11/30/16 0700  Gross per 24 hour  Intake              690 ml  Output             3800 ml  Net            -3110 ml   LBM: Last BM Date: 11/30/16 Baseline Weight: Weight: 77 kg (169 lb 12.1 oz) Most recent weight: Weight: 80.5 kg (177 lb 7.5 oz)       Palliative Assessment/Data:    Flowsheet Rows   Flowsheet Row Most Recent Value  Intake Tab  Referral Department  Critical care  Unit at Time of Referral  ICU  Palliative Care Primary Diagnosis  Neurology  Date Notified  11/25/16  Palliative Care Type  New Palliative care  Reason for referral  Clarify Goals of Care  Date of Admission  11/08/2016  Date first seen by Palliative Care  11/25/16  # of days Palliative referral response time  0 Day(s)  # of days IP prior to Palliative referral  3  Clinical Assessment  Palliative Performance Scale Score  10%  Psychosocial & Spiritual Assessment  Palliative Care Outcomes      Patient Active Problem List   Diagnosis Date Noted  . Anoxic encephalopathy (Ronald Brewer)   . Goals of care, counseling/discussion   . Palliative care encounter   . Acute respiratory failure (Ronald Brewer)   . Cardiac arrest with ventricular fibrillation (Ronald Brewer) 11/03/2016  . Prolonged QT interval 11/20/2016  . Cardiac arrest (Ronald Brewer) 11/19/2016  .  Anemia 11/08/2016  . Hypokalemia 11/08/2016  . Chest pain 11/08/2016  . Neck pain 11/08/2016  . ESRD on dialysis (Ronald Brewer) 09/18/2015  . 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 Ischemic stroke (Ronald Brewer) 09/18/2015  . Headache 09/18/2015  . Polyneuropathy (Ronald Brewer) 09/18/2015  . Diplopia 09/18/2015    Palliative Care Assessment & Plan   Patient Profile: 75 y.o. male  with past medical history of CAD and MI, ESRD on HD, and DM who was admitted on 11/07/2016 with cardiac arrest in hemodialysis.  Ronald Brewer  reportedly received 15 minutes of CPR including defibrillation.  He is now in ICU intubated and unresponsive.  He is now off pressors and receiving hemodialysis.  His EEG shows severe diffuse cerebral dysfunction. His echo shows G2 diastolic dysfunction with an EF of 15 - 20%.  There is concern for anoxic brain injury. PMT was consulted for goals of care.  Assessment: Some improvement in mental status.  Wife very hopeful for extubation.  Recommendations/Plan: Attempted to set expectations about what continued improvement will look like (extubation and swallowing without aspiration). Will continue to meet regularly with Liliana Cline Ronald Brewer) to prepare her for future decisions.  Goals of Care and Additional Recommendations:  Limitations on Scope of Treatment: Full Scope Treatment, DNR/DNI  Prognosis:   Unable to determine.  Patient currently intubated, receiving HD 3x week, OG tube in place for feeding.  Discharge Planning:  To Be Determined    Care plan discussed with Dr. Lamonte Sakai and bedside RN  Thank you for allowing the Palliative Medicine Team to assist in the care of this patient.   Time In: 10:00 Time Out: 10:45 Total Time 45 Prolonged Time Billed NO      Greater than 50%  of this time was spent counseling and coordinating care related to the above assessment and plan.  Imogene Burn, PA-C Palliative Medicine   Please contact Palliative MedicineTeam phone at [phone] for questions and concerns.   Please see AMION for individual provider pager numbers.

## 2016-11-30 NOTE — Progress Notes (Signed)
PULMONARY / CRITICAL CARE MEDICINE   Name: Ronald Brewer MRN: [id number] DOB: Oct 01, 1941    ADMISSION DATE:  11/04/2016 CONSULTATION DATE:  11/30/2016   CHIEF COMPLAINT:  Cardiac arrest  HISTORY OF PRESENT ILLNESS:   75 year old man with ESRD on hemodialysis and CAD was brought in by EMS after a witnessed cardiac arrest at dialysis 11/27. He finished his treatment, then had a syncopal episode and vomited, again syncope with loss of pulse-shock advised the AED. After EMS arrival, noted to be in V. fib given epinephrine 1 with ROSC, total CPR time about 15 minutes. He is a diabetic hypertensive neck: 2011 shows normal ejection fraction He was admitted 10/2016 for generalized weakness, black stools and hemoglobin about 9 attributed to anemia of chronic disease He remained unresponsive after arrival to the emergency room had nonpurposeful movement of both upper extremities EKG showed prolonged QT, frequent PVCs   SUBJECTIVE:  Underwent HD 12/4, tolerated well off pressors Improved MS this am 12/5 > opened eyes and followed commands for RN  VITAL SIGNS: BP (!) 149/64   Pulse 82   Temp 97.3 F (36.3 C) (Oral)   Resp 15   Ht 5\' 10"  (1.778 m)   Wt 80.5 kg (177 lb 7.5 oz)   SpO2 100%   BMI 25.46 kg/m   HEMODYNAMICS:    VENTILATOR SETTINGS: Vent Mode: PRVC FiO2 (%):  [30 %] 30 % Set Rate:  [15 bmp] 15 bmp Vt Set:  [450 mL] 450 mL PEEP:  [5 cmH20] 5 cmH20 Plateau Pressure:  [14 cmH20-18 cmH20] 15 cmH20  INTAKE / OUTPUT: I/O last 3 completed shifts: In: 1350 [I.V.:360; NG/GT:990] Out: 3800 [Emesis/NG output:800; Other:3000]  PHYSICAL EXAMINATION:   General:  chronically ill-appearing elderly  intubated Neuro:  Significant improvement, followed commands, nods to questions, good strength HEENT:  Oral ET tube, pupils 3 mm bilaterally equally reactive to light  S1-S2 normal, no rub, no murmur Decreased breath sounds bilateral, no rhonchi   soft and nontender  abdomen Extremities-left AV fistula with thrill, right femoral CVL. B UE edema, B pedal edema    LABS:  BMET  Recent Labs Lab 11/27/16 0426 11/29/16 1100 11/30/16 0404  NA 132* 135 134*  K 3.6 3.1* 3.2*  CL 95* 98* 96*  CO2 25 29 30   BUN 29* 42* 25*  CREATININE 5.37* 4.80* 2.98*  GLUCOSE 106* 125* 95    Electrolytes  Recent Labs Lab 11/25/16 0348  11/25/16 1750 11/26/16 0515 11/27/16 0426 11/29/16 1100 11/30/16 0404  CALCIUM 7.1*  < > 7.0* 7.1* 7.8* 8.0* 8.2*  MG 1.7  --  1.6* 1.6*  --   --   --   PHOS 5.0*  < > 2.8 2.5  --  2.4*  --   < > = values in this interval not displayed.  CBC  Recent Labs Lab 11/27/16 0426 11/29/16 1100 11/30/16 0404  WBC 20.9* 14.5* 12.3*  HGB 8.4* 7.3* 7.7*  HCT 26.0* 22.1* 23.0*  PLT 79* 77* 94*    Coag's No results for input(s): APTT, INR in the last 168 hours.  Sepsis Markers No results for input(s): LATICACIDVEN, PROCALCITON, O2SATVEN in the last 168 hours.  ABG  Recent Labs Lab 11/24/16 1718 11/28/16 0935  PHART 7.269* 7.390  PCO2ART 57.0* 54.7*  PO2ART 198* 178.0*    Liver Enzymes  Recent Labs Lab 11/25/16 1200 11/29/16 1100  ALBUMIN 1.6* 1.7*    Cardiac Enzymes No results for input(s): TROPONINI, PROBNP in the last 168 hours.  Glucose  Recent Labs Lab 11/29/16 1150 11/29/16 1624 11/29/16 2129 11/30/16 0018 11/30/16 0441 11/30/16 0815  GLUCAP 123* 101* 83 93 103* 104*    Imaging Dg Chest Port 1 View  Result Date: 11/30/2016 CLINICAL DATA:  Acute respiratory failure, shortness of Breath EXAM: PORTABLE CHEST 1 VIEW COMPARISON:  11/27/2016 FINDINGS: Endotracheal tube and NG tube remain in place, unchanged. Heart is normal size. Bilateral lower lobe airspace opacities have improved with mild residual infrahilar opacities bilaterally. No visible effusions or acute bony abnormality. IMPRESSION: Improving bilateral lower lobe airspace opacities. Stable support devices. Electronically Signed   By:  Rolm Baptise M.D.   On: 11/30/2016 08:17     STUDIES:  Echo 11/28>> EF 20%, WMA +, gr 2 DD, RVSP 62  CULTURES:  ANTIBIOTICS:  SIGNIFICANT EVENTS: 11/27 admit  LINES/TUBES: 11/27 rt fem CVL >> 11/27 ETT >>  DISCUSSION:  Cardiac arrest due to ventricular fibrillation in the setting of prolonged QT this may be due to postdialysis shift electrolytes , medication such as Zoloft, intrinsic cardiac disease, acute ischemia less likely   PULMONARY A:  Acute hypoxemic respiratory failure  2/2 unable to protect airway 2/2 cardiac arrest, pulm edema.  P:   Cont vent support. Initiate PSV trials 12/5 Assess for possible extubation as his MS improves   CARDIOVASCULAR A:  S/P ventricular fibrillation Prolonged QT -610 11/28 Cardiogenic shock. Resolved.  P:  Cardiology following. Appreciate recs.  Follow QTc Replete electrolytes May need defibrillator - will discuss with cardiology if we believe he can recover to extubation   RENAL A:   ESRD P:   HD intermittent, MWF Nephrology following. Appreciate recs.   GI A:   no issues P:   protonix  Ct TFs  Heme A:   anemia of chronic disease P:  Follow  ID A:   Leukocytosis without clear infection source.  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 MS improved 12/5 am, should indicate a significant improvement in overall prognosis Head CT 11/30 non diagnostic P:   RASS goal:0  Off sedation for neuro checks    FAMILY  - Updates:   Wife 11/29 >>  limited code issued, she hinted that patient would want to stop HD & not be on prolonged life support. Palliative care team involved. Appreciate recs and their continued involvement. M York spoke with pt's wife by phone this am 12/5  Given pt's improved MS would defer withdrawal of care for now, follow to see where improvement leads [place], possibly to extubation.  Optimally the patient would be able to participate in future decision regarding his care, most importantly whether to continue HD, undergo cardiac workup, etc.   - Inter-disciplinary family meet or Palliative Care meeting due by:  Ongoing     Summary -  Rewarmed, shock resolved ,at risk for further arrhythmias due to prolonged QT, beginning to wake up. HD ongoing. Further cards eval to be determined by overall recovery and prognosis.    The patient is critically ill with multiple organ systems failure and requires high complexity decision making for assessment and support, frequent evaluation and titration of therapies, application of advanced monitoring technologies and extensive interpretation of multiple databases. Critical Care Time devoted to patient care services described in this note independent of APP time is 35 minutes.    Baltazar Apo,  MD, PhD 11/30/2016, 10:08 AM [facility] [phone] or if no answer [phone]

## 2016-11-30 NOTE — Progress Notes (Signed)
[facility] Progress Note   Subjective: HD last night- removed 3 liters tolerated well- arousable and following commands !!  Vitals:   11/30/16 0600 11/30/16 0700 11/30/16 0715 11/30/16 0811  BP: (!) 141/70 (!) 149/64    Pulse: 80 81 82   Resp: _0 Temp:    97.3 F (36.3 C)  TempSrc:    Oral  SpO2: 100% 100% 100%   Weight:      Height:        Inpatient medications: . aspirin  325 mg Per NG tube Daily  . calcitRIOL  1.3 mcg Intravenous Q M,W,F-HD  . carvedilol  3.125 mg Oral BID WC  . chlorhexidine gluconate (MEDLINE KIT)  15 mL Mouth Rinse BID  . darbepoetin (ARANESP) injection - DIALYSIS  60 mcg Intravenous Q Wed-HD  . dextrose  50 mL Intravenous Once  . feeding supplement (NEPRO CARB STEADY)  1,000 mL Per Tube Q24H  . feeding supplement (PRO-STAT SUGAR FREE 64)  30 mL Per Tube TID  . heparin  2,000 Units Dialysis Once in dialysis  . heparin  5,000 Units Subcutaneous Q8H  . isosorbide-hydrALAZINE  1 tablet Oral TID  . mouth rinse  15 mL Mouth Rinse 10 times per day  . mupirocin cream   Topical BID  . pantoprazole sodium  40 mg Per Tube Daily  . sodium chloride flush  10-40 mL Intracatheter Q12H   . sodium chloride 10 mL/hr at 11/30/16 0700  . dextrose 40 mL/hr at 11/25/16 1900   sodium chloride, sodium chloride, sodium chloride, sodium chloride, sodium chloride, alteplase, fentaNYL (SUBLIMAZE) injection, heparin, heparin, lidocaine (PF), lidocaine-prilocaine, pentafluoroprop-tetrafluoroeth, sodium chloride flush  Exam: General: on vent, much more alert than yesterday  HEENT: intubated/oral ET tub Lungs:  clear bilat ant/ lat Heart: RRR  Abdomen:  soft dim BS, right fem central line Ext: mild LE/ UE edema Neuro:  unresponsive Dialysis Access: left upper AVF + bruit  Dialysis Orders: MWF [facility]   3h 5mn   77kg  2/2 bath  Hep 2000   LUA AVF Mircera 75 q 2 weeks-lst 11/15  Calcitriol 1.25 - iPTH 190 - Ca usually 9s and P in  goal  Assessment: 1. SP cardiac arrest- V fib arrest- recovered EF 2. VDRF - per CCM 3. AMS - more alert  4. ESRD- usual HD MWF- via AVF- plan for treatment tomorrow  5. Volume - vol is up- 3 liters off yest- will plan for the same or more on Wednesday  6. Hypotension - resolved 7. Anemia  - hgb 8-9- now 7's , cont darbe 60/wk- will increase, transfuse prn 8. Metabolic bone disease -  Continue calcitriol - convert to IV 9. EOL - DNR- however, seems much better today form previous reports       Kennidy Lamke A  11/30/2016, 9:25 AM    Recent Labs Lab 11/25/16 1750 11/26/16 0515 11/27/16 0426 11/29/16 1100 11/30/16 0404  NA 131* 131* 132* 135 134*  K 3.5 3.7 3.6 3.1* 3.2*  CL 97* 95* 95* 98* 96*  CO2 _1 GLUCOSE 136* 106* 106* 125* 95  BUN 9 14 29* 42* 25*  CREATININE 3.95* 4.49* 5.37* 4.80* 2.98*  CALCIUM 7.0* 7.1* 7.8* 8.0* 8.2*  PHOS 2.8 2.5  --  2.4*  --     Recent Labs Lab 11/25/16 1200 11/29/16 1100  ALBUMIN 1.6* 1.7*    Recent Labs Lab 11/27/16 0426 11/29/16 1100 11/30/16 0404  WBC  20.9* 14.5* 12.3*  HGB 8.4* 7.3* 7.7*  HCT 26.0* 22.1* 23.0*  MCV 85.5 82.5 83.3  PLT 79* 77* 94*   Iron/TIBC/Ferritin/ %Sat No results found for: IRON, TIBC, FERRITIN, IRONPCTSAT

## 2016-11-30 NOTE — Progress Notes (Signed)
Patient Name: Ronald Brewer Date of Encounter: 11/30/2016  Primary Cardiologist: Dr. Talmadge Chad Problem List     Active Problems:   ESRD on dialysis [facility])   Anemia   Hypokalemia   Cardiac arrest with ventricular fibrillation (HCC)   Prolonged QT interval   Cardiac arrest ([place])   Acute respiratory failure (HCC)   Anoxic encephalopathy ([place])   Goals of care, counseling/discussion   Palliative care encounter    Subjective   Denies chest pain.  Inpatient Medications    Scheduled Meds: . aspirin  325 mg Per NG tube Daily  . calcitRIOL  1.3 mcg Intravenous Q M,W,F-HD  . chlorhexidine gluconate (MEDLINE KIT)  15 mL Mouth Rinse BID  . darbepoetin (ARANESP) injection - DIALYSIS  60 mcg Intravenous Q Wed-HD  . dextrose  50 mL Intravenous Once  . feeding supplement (NEPRO CARB STEADY)  1,000 mL Per Tube Q24H  . feeding supplement (PRO-STAT SUGAR FREE 64)  30 mL Per Tube TID  . heparin  2,000 Units Dialysis Once in dialysis  . heparin  5,000 Units Subcutaneous Q8H  . isosorbide-hydrALAZINE  1 tablet Oral TID  . mouth rinse  15 mL Mouth Rinse 10 times per day  . mupirocin cream   Topical BID  . pantoprazole sodium  40 mg Per Tube Daily  . sodium chloride flush  10-40 mL Intracatheter Q12H   Continuous Infusions: . sodium chloride 10 mL/hr at 11/30/16 0700  . dextrose 40 mL/hr at 11/25/16 1900   PRN Meds: sodium chloride, sodium chloride, sodium chloride, sodium chloride, sodium chloride, alteplase, fentaNYL (SUBLIMAZE) injection, heparin, heparin, lidocaine (PF), lidocaine-prilocaine, pentafluoroprop-tetrafluoroeth, sodium chloride flush   Vital Signs    Vitals:   11/30/16 0600 11/30/16 0700 11/30/16 0715 11/30/16 0811  BP: (!) 141/70 (!) 149/64    Pulse: 80 81 82   Resp: 13 18 15    Temp:    97.3 F (36.3 C)  TempSrc:    Oral  SpO2: 100% 100% 100%   Weight:      Height:        Intake/Output Summary (Last 24 hours) at 11/30/16 0848 Last data filed at  11/30/16 0700  Gross per 24 hour  Intake              770 ml  Output             3800 ml  Net            -3030 ml   Filed Weights   11/29/16 1045 11/29/16 1445 11/30/16 0340  Weight: 85.2 kg (187 lb 13.3 oz) 82.2 kg (181 lb 3.5 oz) 80.5 kg (177 lb 7.5 oz)    Physical Exam    GEN: Critically ill.  Intubated. In no acute distress.  HEENT: Grossly normal.  Neck: Supple, no JVD, carotid bruits, or masses. Cardiac: RRR, no murmurs, rubs, or gallops. No clubbing, cyanosis, 2+ pitting edema in bilateral upper extremities .  Radials/DP/PT 2+ and equal bilaterally.  Respiratory:  Respirations regular and unlabored, clear to auscultation bilaterally. GI: Soft, nontender, nondistended, BS + x 4. MS: no deformity or atrophy. Skin: warm and dry, no rash. Neuro:  Spontaneous, purposeful movement of bilateral upper and lower extremities.  Following commands.  Not eating appropriately to questions.  Labs    CBC  Recent Labs  11/29/16 1100 11/30/16 0404  WBC 14.5* 12.3*  HGB 7.3* 7.7*  HCT 22.1* 23.0*  MCV 82.5 83.3  PLT 77* 94*  Basic Metabolic Panel  Recent Labs  11/29/16 1100 11/30/16 0404  NA 135 134*  K 3.1* 3.2*  CL 98* 96*  CO2 29 30  GLUCOSE 125* 95  BUN 42* 25*  CREATININE 4.80* 2.98*  CALCIUM 8.0* 8.2*  PHOS 2.4*  --    Liver Function Tests  Recent Labs  11/29/16 1100  ALBUMIN 1.7*   No results for input(s): LIPASE, AMYLASE in the last 72 hours. Cardiac Enzymes No results for input(s): CKTOTAL, CKMB, CKMBINDEX, TROPONINI in the last 72 hours. BNP Invalid input(s): POCBNP D-Dimer No results for input(s): DDIMER in the last 72 hours. Hemoglobin A1C No results for input(s): HGBA1C in the last 72 hours. Fasting Lipid Panel No results for input(s): CHOL, HDL, LDLCALC, TRIG, CHOLHDL, LDLDIRECT in the last 72 hours. Thyroid Function Tests No results for input(s): TSH, T4TOTAL, T3FREE, THYROIDAB in the last 72 hours.  Invalid input(s): FREET3  Telemetry      No events.  Sinus rhythm. - Personally Reviewed  ECG    n/a  Radiology    Dg Chest Port 1 View  Result Date: 11/30/2016 CLINICAL DATA:  Acute respiratory failure, shortness of Breath EXAM: PORTABLE CHEST 1 VIEW COMPARISON:  11/27/2016 FINDINGS: Endotracheal tube and NG tube remain in place, unchanged. Heart is normal size. Bilateral lower lobe airspace opacities have improved with mild residual infrahilar opacities bilaterally. No visible effusions or acute bony abnormality. IMPRESSION: Improving bilateral lower lobe airspace opacities. Stable support devices. Electronically Signed   By: Rolm Baptise M.D.   On: 11/30/2016 08:17    Cardiac Studies   11/30/16 Echo: Study Conclusions  - Left ventricle: The cavity size was normal. Wall thickness was   increased in a pattern of mild LVH. Systolic function was normal.   The estimated ejection fraction was in the range of 55% to 60%.   Wall motion was normal; there were no regional wall motion   abnormalities. Features are consistent with a pseudonormal left   ventricular filling pattern, with concomitant abnormal relaxation   and increased filling pressure (grade 2 diastolic dysfunction). - Mitral valve: There was mild regurgitation. - Left atrium: The atrium was mildly dilated. - Right ventricle: The cavity size was mildly dilated. - Pulmonary arteries: Systolic pressure was moderately increased.   PA peak pressure: 63 mm Hg (S).  11/23/16 echo:  - Left ventricle: The cavity size was normal. Wall thickness was increased in a pattern of moderate LVH. There was moderate concentric hypertrophy. Systolic function was severely reduced. The estimated ejection fraction was in the range of 15% to 20%. There is akinesis of the entireapical myocardium. There is akinesis of the apicalinferior and apical myocardium. Features are consistent with a pseudonormal left ventricular filling pattern, with concomitant abnormal  relaxation and increased filling pressure (grade 2 diastolic dysfunction). Doppler parameters are consistent with high ventricular filling pressure. - Aortic valve: Structurally normal valve. Trileaflet. - Mitral valve: There was moderate regurgitation. - Left atrium: The atrium was moderately dilated. - Pulmonary arteries: Systolic pressure was moderately increased. PA peak pressure: 62 mm Hg (S).  07/23/2010 echo  - Left ventricle: The cavity size was normal. Wall thickness was  increased increased in a pattern of mild to moderate LVH. Systolic  function was normal. The estimated ejection fraction was in the  range of 50% to 55%. Wall motion was normal; there were no  regional wall motion abnormalities. Doppler parameters are  consistent with abnormal left ventricular relaxation (grade 1  diastolic dysfunction). - Mitral  valve: Mild regurgitation.  Patient Profile     Ronald Brewer is a 46M with ESRD on HD, DM, and hypertension here with VF arrest, severely prolonged QT in the setting of hypokalemia.  LVEF severely depressed post arrest.  He has undergone therapeutic hypothermia.    Assessment & Plan    # VF arrest: # [place]: QTc 559 on 11/27/16.  Avoid QT prolonging medications.  He was also hypokalemic at the time of his arrest.  Given the recovery in his EF and left wall motion abnormalities, we'll likely plan for a Myoview once he is extubated and stable.  # Acute systolic and diastolic heart failure: # Severe pulmonary hypertension: LVEF is 15-20% following VF arrest. Repeat echo 11/29/16 revealed complete recovery of LVEF with an ejection fraction of 55-60% and grade 2 diastolic dysfunction. He was noted to have severe pulmonary hypertension with PA AST of 63 mmHg. Of note, this was while on the ventilator.  He was net negative 3L with HD yesterday.  Blood pressure is above goal.  We'll start carvedilol 3.125 mg twice daily.    # Cardiogenic  shock: Resolved.  No longer requiring pressors.    # Respiratory failure:  Remains intubated.  Per critical care.  # ESRD:  HD MWF.  # Anoxic encephalopathy: This AM Ronald Brewer is meaningfully responding to questions and was following commands. Given this new finding and his improvement in LVEF, his prognosis seems much better.   Time spent: 30 minutes-Greater than 50% of this time was spent in counseling, explanation of diagnosis, planning of further management, and coordination of care.  Signed, Skeet Latch, MD  11/30/2016, 8:48 AM

## 2016-12-01 LAB — BASIC METABOLIC PANEL
Anion gap: 8 (ref 5–15)
BUN: 44 mg/dL — AB (ref 6–20)
CALCIUM: 8.6 mg/dL — AB (ref 8.9–10.3)
CO2: 31 mmol/L (ref 22–32)
CREATININE: 3.92 mg/dL — AB (ref 0.61–1.24)
Chloride: 97 mmol/L — ABNORMAL LOW (ref 101–111)
GFR, EST AFRICAN AMERICAN: 16 mL/min — AB (ref >60)
GFR, EST NON AFRICAN AMERICAN: 14 mL/min — AB (ref >60)
Glucose, Bld: 107 mg/dL — ABNORMAL HIGH (ref 65–99)
Potassium: 3.2 mmol/L — ABNORMAL LOW (ref 3.5–5.1)
SODIUM: 136 mmol/L (ref 135–145)

## 2016-12-01 LAB — GLUCOSE, CAPILLARY
GLUCOSE-CAPILLARY: 121 mg/dL — AB (ref 65–99)
GLUCOSE-CAPILLARY: 70 mg/dL (ref 65–99)
GLUCOSE-CAPILLARY: 81 mg/dL (ref 65–99)
Glucose-Capillary: 114 mg/dL — ABNORMAL HIGH (ref 65–99)
Glucose-Capillary: 111 mg/dL — ABNORMAL HIGH (ref 65–99)
Glucose-Capillary: 73 mg/dL (ref 65–99)

## 2016-12-01 LAB — CBC
HCT: 23.8 % — ABNORMAL LOW (ref 39.0–52.0)
Hemoglobin: 8 g/dL — ABNORMAL LOW (ref 13.0–17.0)
MCH: 28 pg (ref 26.0–34.0)
MCHC: 33.6 g/dL (ref 30.0–36.0)
MCV: 83.2 fL (ref 78.0–100.0)
PLATELETS: 132 10*3/uL — AB (ref 150–400)
RBC: 2.86 MIL/uL — AB (ref 4.22–5.81)
RDW: 14.9 % (ref 11.5–15.5)
WBC: 9.3 10*3/uL (ref 4.0–10.5)

## 2016-12-01 LAB — PHOSPHORUS: Phosphorus: 2.6 mg/dL (ref 2.5–4.6)

## 2016-12-01 LAB — MAGNESIUM: MAGNESIUM: 2 mg/dL (ref 1.7–2.4)

## 2016-12-01 MED ORDER — DARBEPOETIN ALFA 150 MCG/0.3ML IJ SOSY
PREFILLED_SYRINGE | INTRAMUSCULAR | Status: AC
  Administered 2016-12-01: 150 ug via INTRAVENOUS
  Filled 2016-12-01: qty 0.3

## 2016-12-01 MED ORDER — FENTANYL CITRATE (PF) 100 MCG/2ML IJ SOLN
INTRAMUSCULAR | Status: AC
  Filled 2016-12-01: qty 2

## 2016-12-01 MED ORDER — FENTANYL CITRATE (PF) 100 MCG/2ML IJ SOLN
25.0000 ug | INTRAMUSCULAR | Status: DC | PRN
  Administered 2016-12-01 – 2016-12-02 (×3): 25 ug via INTRAVENOUS
  Filled 2016-12-01 (×2): qty 2

## 2016-12-01 MED ORDER — CARVEDILOL 6.25 MG PO TABS
6.2500 mg | ORAL_TABLET | Freq: Two times a day (BID) | ORAL | Status: DC
Start: 2016-12-01 — End: 2016-12-02
  Filled 2016-12-01: qty 1

## 2016-12-01 MED ORDER — FENTANYL CITRATE (PF) 100 MCG/2ML IJ SOLN
25.0000 ug | Freq: Once | INTRAMUSCULAR | Status: AC
  Administered 2016-12-01: 25 ug via INTRAVENOUS

## 2016-12-01 NOTE — Progress Notes (Signed)
Pt becoming more lethargic post HD.  RT placed pt back on full support so pt can rest overnight and have optimal chance of extubation in am.

## 2016-12-01 NOTE — Progress Notes (Signed)
eLink Physician-Brief Progress Note Patient Name: Ronald Brewer DOB: [birth year] MRN: [id number]   Date of Service  12/01/2016  HPI/Events of Note  Agitation - biting on and attempting to grab ETT.  eICU Interventions  Will order: 1. Increase Fentanyl to 25 mcg IV Q 1 hour PRN.      Intervention Category Minor Interventions: Agitation / anxiety - evaluation and management  Sommer,Steven Eugene 12/01/2016, 2:11 AM

## 2016-12-01 NOTE — Progress Notes (Signed)
Dialysis treatment completed.  4500 mL ultrafiltrated and net fluid removal 4000 mL.    Patient status unchanged. Lung sounds diminished to ausculation in all fields. BLLE and BLUE 2+ pitting edema. Cardiac: NSR.  Disconnected lines and removed needles.  Pressure held for 10 minutes and band aid/gauze dressing applied.  Report given to bedside RN, Melody.

## 2016-12-01 NOTE — Progress Notes (Signed)
Patient Name: Ronald Brewer Date of Encounter: 12/01/2016  Primary Cardiologist: Dr. Talmadge Chad Problem List     Active Problems:   ESRD on dialysis [facility])   Anemia   Hypokalemia   Cardiac arrest with ventricular fibrillation (HCC)   Prolonged QT interval   Cardiac arrest ([place])   Acute respiratory failure (HCC)   Anoxic encephalopathy (HCC)   Goals of care, counseling/discussion   Palliative care encounter    Subjective   Denies pain.   Overnight events: Agitated and pulling at ETT overnight.  Inpatient Medications    Scheduled Meds: . aspirin  325 mg Per NG tube Daily  . calcitRIOL  1.3 mcg Intravenous Q M,W,F-HD  . carvedilol  3.125 mg Oral BID WC  . cetaphil   Topical BID  . chlorhexidine gluconate (MEDLINE KIT)  15 mL Mouth Rinse BID  . darbepoetin (ARANESP) injection - DIALYSIS  150 mcg Intravenous Q Wed-HD  . dextrose  50 mL Intravenous Once  . feeding supplement (NEPRO CARB STEADY)  1,000 mL Per Tube Q24H  . feeding supplement (PRO-STAT SUGAR FREE 64)  30 mL Per Tube TID  . heparin  2,000 Units Dialysis Once in dialysis  . heparin  5,000 Units Subcutaneous Q8H  . isosorbide-hydrALAZINE  1 tablet Oral TID  . mouth rinse  15 mL Mouth Rinse 10 times per day  . mupirocin cream   Topical BID  . pantoprazole sodium  40 mg Per Tube Daily  . sodium chloride flush  10-40 mL Intracatheter Q12H   Continuous Infusions: . sodium chloride 10 mL/hr at 12/01/16 0800  . dextrose 40 mL/hr at 11/25/16 1900   PRN Meds: sodium chloride, sodium chloride, alteplase, fentaNYL (SUBLIMAZE) injection, heparin, heparin, lidocaine (PF), lidocaine-prilocaine, pentafluoroprop-tetrafluoroeth, sodium chloride flush   Vital Signs    Vitals:   12/01/16 0600 12/01/16 0700 12/01/16 0710 12/01/16 0800  BP: (!) 133/55 (!) 153/69  (!) 137/53  Pulse: 70 73 76 71  Resp: 15 14 14    Temp:    97.3 F (36.3 C)  TempSrc:    Oral  SpO2: 99% 100% 100% 99%  Weight:      Height:         Intake/Output Summary (Last 24 hours) at 12/01/16 0843 Last data filed at 12/01/16 0800  Gross per 24 hour  Intake            969.5 ml  Output                0 ml  Net            969.5 ml   Filed Weights   11/29/16 1445 11/30/16 0340 12/01/16 0300  Weight: 82.2 kg (181 lb 3.5 oz) 80.5 kg (177 lb 7.5 oz) 81.4 kg (179 lb 7.3 oz)    Physical Exam    GEN: Critically ill.  Intubated. In no acute distress.  HEENT: Grossly normal.  Neck: Supple, no carotid bruits, or masses. Cardiac: RRR, no murmurs, rubs, or gallops. No clubbing, cyanosis, 2+ pitting edema in bilateral upper extremities. No lower extremity edema  .  Radials/DP/PT 2+ and equal bilaterally.  Respiratory:  Respirations regular and unlabored, clear to auscultation bilaterally. GI: Soft, nontender, nondistended, BS + x 4. MS: no deformity or atrophy. Skin: warm and dry, no rash. Neuro:  Spontaneous, purposeful movement of bilateral upper and lower extremities.  Following commands.  Nodding appropriately to questions.  Labs    CBC  Recent Labs  11/30/16 0404  12/01/16 0242  WBC 12.3* 9.3  HGB 7.7* 8.0*  HCT 23.0* 23.8*  MCV 83.3 83.2  PLT 94* 025*   Basic Metabolic Panel  Recent Labs  11/29/16 1100 11/30/16 0404 12/01/16 0242  NA 135 134* 136  K 3.1* 3.2* 3.2*  CL 98* 96* 97*  CO2 29 30 31   GLUCOSE 125* 95 107*  BUN 42* 25* 44*  CREATININE 4.80* 2.98* 3.92*  CALCIUM 8.0* 8.2* 8.6*  MG  --   --  2.0  PHOS 2.4*  --  2.6   Liver Function Tests  Recent Labs  11/29/16 1100  ALBUMIN 1.7*   No results for input(s): LIPASE, AMYLASE in the last 72 hours. Cardiac Enzymes No results for input(s): CKTOTAL, CKMB, CKMBINDEX, TROPONINI in the last 72 hours. BNP Invalid input(s): POCBNP D-Dimer No results for input(s): DDIMER in the last 72 hours. Hemoglobin A1C No results for input(s): HGBA1C in the last 72 hours. Fasting Lipid Panel No results for input(s): CHOL, HDL, LDLCALC, TRIG, CHOLHDL,  LDLDIRECT in the last 72 hours. Thyroid Function Tests No results for input(s): TSH, T4TOTAL, T3FREE, THYROIDAB in the last 72 hours.  Invalid input(s): FREET3  Telemetry    No events.  Sinus rhythm. - Personally Reviewed  ECG    12/01/16: Sinus rhythm. Rate 73 bpm. RSR prime in V1. Anterolateral T-wave inversions concerning for ischemia.  LAFB. QTc 491.  Radiology    Dg Chest Port 1 View  Result Date: 11/30/2016 CLINICAL DATA:  Acute respiratory failure, shortness of Breath EXAM: PORTABLE CHEST 1 VIEW COMPARISON:  11/27/2016 FINDINGS: Endotracheal tube and NG tube remain in place, unchanged. Heart is normal size. Bilateral lower lobe airspace opacities have improved with mild residual infrahilar opacities bilaterally. No visible effusions or acute bony abnormality. IMPRESSION: Improving bilateral lower lobe airspace opacities. Stable support devices. Electronically Signed   By: Rolm Baptise M.D.   On: 11/30/2016 08:17    Cardiac Studies   11/30/16 Echo: Study Conclusions  - Left ventricle: The cavity size was normal. Wall thickness was   increased in a pattern of mild LVH. Systolic function was normal.   The estimated ejection fraction was in the range of 55% to 60%.   Wall motion was normal; there were no regional wall motion   abnormalities. Features are consistent with a pseudonormal left   ventricular filling pattern, with concomitant abnormal relaxation   and increased filling pressure (grade 2 diastolic dysfunction). - Mitral valve: There was mild regurgitation. - Left atrium: The atrium was mildly dilated. - Right ventricle: The cavity size was mildly dilated. - Pulmonary arteries: Systolic pressure was moderately increased.   PA peak pressure: 63 mm Hg (S).  11/23/16 echo:  - Left ventricle: The cavity size was normal. Wall thickness was increased in a pattern of moderate LVH. There was moderate concentric hypertrophy. Systolic function was severely  reduced. The estimated ejection fraction was in the range of 15% to 20%. There is akinesis of the entireapical myocardium. There is akinesis of the apicalinferior and apical myocardium. Features are consistent with a pseudonormal left ventricular filling pattern, with concomitant abnormal relaxation and increased filling pressure (grade 2 diastolic dysfunction). Doppler parameters are consistent with high ventricular filling pressure. - Aortic valve: Structurally normal valve. Trileaflet. - Mitral valve: There was moderate regurgitation. - Left atrium: The atrium was moderately dilated. - Pulmonary arteries: Systolic pressure was moderately increased. PA peak pressure: 62 mm Hg (S).  07/23/2010 echo  - Left ventricle: The cavity size was  normal. Wall thickness was  increased increased in a pattern of mild to moderate LVH. Systolic  function was normal. The estimated ejection fraction was in the  range of 50% to 55%. Wall motion was normal; there were no  regional wall motion abnormalities. Doppler parameters are  consistent with abnormal left ventricular relaxation (grade 1  diastolic dysfunction). - Mitral valve: Mild regurgitation.  Patient Profile     Mr. Pierpoint is a 53M with ESRD on HD, DM, and hypertension here with VF arrest, severely prolonged QT in the setting of hypokalemia.  LVEF severely depressed post arrest.  He has undergone therapeutic hypothermia.    Assessment & Plan    # VF arrest: # [place]: QTc Down to 491 from 559 on 11/27/16.  Avoid QT prolonging medications.  He was also hypokalemic at the time of his arrest.  Given the recovery in his EF and lack of wall motion abnormalities, we'll likely plan for a Myoview once he is extubated and stable.  If negative for ischemia and there is significant neurological recovery, we will consult EP for discussion about ICD.  # Acute systolic and diastolic heart failure: # Severe  pulmonary hypertension: LVEF was 15-20% following VF arrest. Repeat echo 11/29/16 revealed complete recovery of LVEF with an ejection fraction of 55-60% and grade 2 diastolic dysfunction. He was noted to have severe pulmonary hypertension with PASP of 63 mmHg. Of note, this was while on the ventilator.  Due for HD with volume removal today.  Carvedilol was started 12/5 and he has tolerated this well.  We will stop Bidil and increase carvedilol to 6.94m bid.   # Cardiogenic shock: Resolved.  No longer requiring pressors.    # Respiratory failure:  Remains intubated.  Per critical care.  # ESRD:  HD MWF.  # Anoxic encephalopathy: Mr. HBaisis meaningfully responding to questions and  following commands. Given this new finding and his improvement in LVEF, his prognosis seems much better, though still guarded.   Time spent: 30 minutes-Greater than 50% of this time was spent in counseling, explanation of diagnosis, planning of further management, and coordination of care.  Signed, TSkeet Latch MD  12/01/2016, 8:43 AM

## 2016-12-01 NOTE — Progress Notes (Signed)
Daily Progress Note   Patient Name: Ronald Brewer       Date: 12/01/2016 DOB: [birth year]  Age: 75 y.o. MRN#: [id number] Attending Physician: Rigoberto Noel, MD Primary Care Physician: Placido Sou, MD Admit Date: 11/07/2016  Reason for Consultation/Follow-up: Establishing goals of care  Subjective:  After examining the patient, speaking with the RN and Dr. Lamonte Sakai, I called the patient's wife - Ronald Brewer. I updated her on his status and let her know it was likely he would be extubated after hemodialysis today.  We discussed whether or not to re-intubate if he declines again - Ronald Brewer said no.  She does not want the tube put back down his throat.  She will pray for him and she believes he will not need it.  She was very clear she does not want Ronald Brewer.  Likewise, we talked yesterday about a defibrillator.  She did not want him to have one.  We did not have a detailed discussion about an ICD, but Ronald Brewer said she would pray for him and she felt he would not need it.    Ronald Brewer will be up to see Ronald Brewer after lunch today.  Length of Stay: 9  Current Medications: Scheduled Meds:  . aspirin  325 mg Per NG tube Daily  . calcitRIOL  1.3 mcg Intravenous Q M,W,F-HD  . carvedilol  6.25 mg Oral BID WC  . cetaphil   Topical BID  . chlorhexidine gluconate (MEDLINE KIT)  15 mL Mouth Rinse BID  . darbepoetin (ARANESP) injection - DIALYSIS  150 mcg Intravenous Q Wed-HD  . dextrose  50 mL Intravenous Once  . feeding supplement (NEPRO CARB STEADY)  1,000 mL Per Tube Q24H  . feeding supplement (PRO-STAT SUGAR FREE 64)  30 mL Per Tube TID  . heparin  2,000 Units Dialysis Once in dialysis  . heparin  5,000 Units Subcutaneous Q8H  . mouth rinse  15 mL Mouth Rinse 10 times per day  . mupirocin  cream   Topical BID  . pantoprazole sodium  40 mg Per Tube Daily  . sodium chloride flush  10-40 mL Intracatheter Q12H    Continuous Infusions: . sodium chloride 10 mL/hr at 12/01/16 0900  . dextrose 40 mL/hr at 11/25/16 1900    PRN Meds: sodium chloride, sodium chloride, alteplase, fentaNYL (  SUBLIMAZE) injection, heparin, heparin, lidocaine (PF), lidocaine-prilocaine, pentafluoroprop-tetrafluoroeth, sodium chloride flush  Physical Exam  Constitutional: He appears well-developed.  Intubated. Calm.  Neck: Neck supple. No thyromegaly present.  Cardiovascular: Normal rate.   Murmur heard. Pulmonary/Chest: Effort normal.  Intubated.  No apparent distress.  Abdominal: Soft. He exhibits no distension. There is no tenderness.  Musculoskeletal: He exhibits edema.  Swelling noted in hands bilaterally. Able to move hands and feet on command.  Neurological:  Opens eyes to voice.  Responds to questions.  Follows commands.  Skin: Skin is warm and dry.  Multiple skin tears.  Psychiatric:  Unable to assess.          Vital Signs: BP (!) 145/58   Pulse 71   Temp 97.3 F (36.3 C) (Oral)   Resp 17   Ht 5' 10"  (1.778 m)   Wt 81.4 kg (179 lb 7.3 oz)   SpO2 100%   BMI 25.75 kg/m  SpO2: SpO2: 100 % O2 Device: O2 Device: Ventilator O2 Flow Rate: O2 Flow Rate (L/min): 70 L/min  Intake/output summary:   Intake/Output Summary (Last 24 hours) at 12/01/16 1006 Last data filed at 12/01/16 0900  Gross per 24 hour  Intake            929.5 ml  Output                0 ml  Net            929.5 ml   LBM: Last BM Date: 12/01/16 Baseline Weight: Weight: 77 kg (169 lb 12.1 oz) Most recent weight: Weight: 81.4 kg (179 lb 7.3 oz)       Palliative Assessment/Data:    Flowsheet Rows   Flowsheet Row Most Recent Value  Intake Tab  Referral Department  Critical care  Unit at Time of Referral  ICU  Palliative Care Primary Diagnosis  Neurology  Date Notified  11/25/16  Palliative Care Type   New Palliative care  Reason for referral  Clarify Goals of Care  Date of Admission  11/19/2016  Date first seen by Palliative Care  11/25/16  # of days Palliative referral response time  0 Day(s)  # of days IP prior to Palliative referral  3  Clinical Assessment  Palliative Performance Scale Score  20%  Psychosocial & Spiritual Assessment  Palliative Care Outcomes      Patient Active Problem List   Diagnosis Date Noted  . Anoxic encephalopathy ([place])   . Goals of care, counseling/discussion   . Palliative care encounter   . Acute respiratory failure ([place])   . Cardiac arrest with ventricular fibrillation ([place]) 11/10/2016  . Prolonged QT interval 11/23/2016  . Cardiac arrest ([place]) 11/21/2016  . Anemia 11/08/2016  . Hypokalemia 11/08/2016  . Chest pain 11/08/2016  . Neck pain 11/08/2016  . ESRD on dialysis ([place]) 09/18/2015  . 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 Ischemic stroke ([place]) 09/18/2015  . Headache 09/18/2015  . Polyneuropathy ([place]) 09/18/2015  . Diplopia 09/18/2015    Palliative Care Assessment & Plan   Patient Profile: 75 y.o. male  with past medical history of CAD and MI, ESRD on HD, and DM who was admitted on 10/28/2016 with cardiac arrest in hemodialysis.  Ronald Brewer reportedly received 15 minutes of CPR including defibrillation.  He is now in ICU intubated and unresponsive.  He is now off pressors and receiving hemodialysis.  His EEG shows severe diffuse cerebral dysfunction. His echo shows G2 diastolic dysfunction with  an EF of 15 - 20%.  There is concern for anoxic brain injury. PMT was consulted for goals of care.  Assessment: Likely extubation today after HD.  Extubation without re-intubation.  Recommendations/Plan:  Will continue to meet regularly with Ronald Brewer Ronald Brewer) to update her and prepare her for future decisions.  Goals of Care and Additional Recommendations:  Limitations on Scope of Treatment: Full Scope Treatment, DNR/DNI  Continue  hemodialysis.  Prognosis:   Unable to determine.  Patient currently intubated, receiving HD 3x week, OG tube in place for feeding.  Discharge Planning:  To Be Determined    Care plan discussed with Dr. Lamonte Sakai and bedside RN  Thank you for allowing the Palliative Medicine Team to assist in the care of this patient.   Time In: 9:00 Time Out: 9:25 Total Time 25 Prolonged Time Billed NO      Greater than 50%  of this time was spent counseling and coordinating care related to the above assessment and plan.  Imogene Burn, PA-C Palliative Medicine   Please contact Palliative MedicineTeam phone at [phone] for questions and concerns.   Please see AMION for individual provider pager numbers.

## 2016-12-01 NOTE — Progress Notes (Signed)
eLink Physician-Brief Progress Note Patient Name: Ronald Brewer DOB: December 10, 1941 MRN: [id number]   Date of Service  12/01/2016  HPI/Events of Note  Agitation - Already on Fentanyl 25 mcg IV Q 4 hours PRN.   eICU Interventions  Will order Fentanyl 25 mcg IV X 1 now.      Intervention Category Minor Interventions: Agitation / anxiety - evaluation and management  Sommer,Steven Eugene 12/01/2016, 12:36 AM

## 2016-12-01 NOTE — Progress Notes (Signed)
PULMONARY / CRITICAL CARE MEDICINE   Name: Ronald Brewer MRN: [id number] DOB: 03-04-1941    ADMISSION DATE:  11/13/2016 CONSULTATION DATE:  12/01/2016   CHIEF COMPLAINT:  Cardiac arrest  HISTORY OF PRESENT ILLNESS:   75 year old man with ESRD on hemodialysis and CAD was brought in by EMS after a witnessed cardiac arrest at dialysis 11/27. He finished his treatment, then had a syncopal episode and vomited, again syncope with loss of pulse-shock advised the AED. After EMS arrival, noted to be in V. fib given epinephrine 1 with ROSC, total CPR time about 15 minutes. He is a diabetic hypertensive neck: 2011 shows normal ejection fraction He was admitted 10/2016 for generalized weakness, black stools and hemoglobin about 9 attributed to anemia of chronic disease He remained unresponsive after arrival to the emergency room had nonpurposeful movement of both upper extremities EKG showed prolonged QT, frequent PVCs  SUBJECTIVE:  Pt continues to improve neurologically, follows commands Currently on PSV, VT's are a bit low but tolerating  VITAL SIGNS: BP (!) 137/53 (BP Location: Right Arm)   Pulse 71   Temp 97.3 F (36.3 C) (Oral)   Resp 14   Ht 5\' 10"  (1.778 m)   Wt 81.4 kg (179 lb 7.3 oz)   SpO2 99%   BMI 25.75 kg/m   HEMODYNAMICS:    VENTILATOR SETTINGS: Vent Mode: CPAP;PSV FiO2 (%):  [30 %] 30 % Set Rate:  [15 bmp] 15 bmp Vt Set:  [450 mL] 450 mL PEEP:  [5 cmH20] 5 cmH20 Pressure Support:  [12 cmH20] 12 cmH20 Plateau Pressure:  [17 cmH20-18 cmH20] 18 cmH20  INTAKE / OUTPUT: I/O last 3 completed shifts: In: 1059.5 [I.V.:360; NG/GT:699.5] Out: 800 [Emesis/NG output:800]  PHYSICAL EXAMINATION:   General:  chronically ill-appearing elderly  intubated Neuro:  Significant improvement, following commands, nods to questions, good strength HEENT:  Oral ET tube, pupils 3 mm bilaterally equally reactive to light  S1-S2 normal, no rub, no murmur Decreased breath sounds bilateral,  no rhonchi   soft and nontender abdomen Extremities-left AV fistula with thrill, right femoral CVL. B UE edema, B pedal edema    LABS:  BMET  Recent Labs Lab 11/29/16 1100 11/30/16 0404 12/01/16 0242  NA 135 134* 136  K 3.1* 3.2* 3.2*  CL 98* 96* 97*  CO2 29 30 31   BUN 42* 25* 44*  CREATININE 4.80* 2.98* 3.92*  GLUCOSE 125* 95 107*    Electrolytes  Recent Labs Lab 11/25/16 1750 11/26/16 0515  11/29/16 1100 11/30/16 0404 12/01/16 0242  CALCIUM 7.0* 7.1*  < > 8.0* 8.2* 8.6*  MG 1.6* 1.6*  --   --   --  2.0  PHOS 2.8 2.5  --  2.4*  --  2.6  < > = values in this interval not displayed.  CBC  Recent Labs Lab 11/29/16 1100 11/30/16 0404 12/01/16 0242  WBC 14.5* 12.3* 9.3  HGB 7.3* 7.7* 8.0*  HCT 22.1* 23.0* 23.8*  PLT 77* 94* 132*    Coag's No results for input(s): APTT, INR in the last 168 hours.  Sepsis Markers No results for input(s): LATICACIDVEN, PROCALCITON, O2SATVEN in the last 168 hours.  ABG  Recent Labs Lab 11/24/16 1718 11/28/16 0935  PHART 7.269* 7.390  PCO2ART 57.0* 54.7*  PO2ART 198* 178.0*    Liver Enzymes  Recent Labs Lab 11/25/16 1200 11/29/16 1100  ALBUMIN 1.6* 1.7*    Cardiac Enzymes No results for input(s): TROPONINI, PROBNP in the last 168 hours.  Glucose  Recent Labs Lab 11/30/16 1249 11/30/16 1609 11/30/16 2022 11/30/16 2334 12/01/16 0353 12/01/16 0822  GLUCAP 95 93 121* 105* 114* 121*    Imaging No results found.   STUDIES:  Echo 11/28>> EF 20%, WMA +, gr 2 DD, RVSP 62  CULTURES:  ANTIBIOTICS:  SIGNIFICANT EVENTS: 11/27 admit  LINES/TUBES: 11/27 rt fem CVL >> 11/27 ETT >>  DISCUSSION:  Cardiac arrest due to ventricular fibrillation in the setting of prolonged QT this may be due to postdialysis shift electrolytes , medication such as Zoloft, intrinsic cardiac disease, acute ischemia less likely   PULMONARY A:  Acute hypoxemic respiratory failure  2/2 unable to protect airway 2/2  cardiac arrest, pulm edema.  P:   Cont vent support. Continue PSV, hope that he will be an extubation candidate today. Appreciate Palliative Care input. Will plan for an extubation without reintubation should he fail. Would like for him to have HD today, then reassess.   CARDIOVASCULAR A:  S/P ventricular fibrillation Prolonged QT -610 11/28 Cardiogenic shock. Resolved.  P:  Cardiology following. Appreciate recs.  Follow QTc Replete electrolytes May need defibrillator - will discuss with cardiology if we believe he can recover. His philosophy for his care will be a factor here - he may not want an intervention.    RENAL A:   ESRD P:   HD intermittent, MWF Nephrology following. Will have HD today per usual schedule.   GI A:   no issues P:   protonix  Ct TFs  Heme A:   anemia of chronic disease P:  Follow  ID A:   Leukocytosis without clear infection source.  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 MS improved 12/5 am, should indicate a significant improvement in overall prognosis Head CT 11/30 non diagnostic P:   RASS goal:0  Off sedation for neuro checks    FAMILY  - Updates:  M. York with Palliative Care spoke with patient's wife this am 12/6, notified her that there is a window of opportunity for extubation. She also clarified his code status, that he would not want to be reintubated should he fail.   - Inter-disciplinary family meet or Palliative Care meeting due by:  Ongoing     Summary -  Rewarmed, shock resolved ,at risk for further arrhythmias due to prolonged QT, beginning to wake up. HD ongoing. Further cards eval to be determined by overall recovery and prognosis.    The patient is critically ill with multiple organ systems failure and requires high complexity decision making for assessment and support, frequent evaluation and  titration of therapies, application of advanced monitoring technologies and extensive interpretation of multiple databases. Critical Care Time devoted to patient care services described in this note independent of APP time is 35 minutes.    Baltazar Apo, MD, PhD 12/01/2016, 9:24 AM [facility] [phone] or if no answer [phone]

## 2016-12-01 NOTE — Progress Notes (Signed)
[facility] Progress Note   Subjective: More agitated needing sedating meds  Vitals:   12/01/16 0600 12/01/16 0700 12/01/16 0710 12/01/16 0800  BP: (!) 133/55 (!) 153/69  (!) 137/53  Pulse: 70 73 76 71  Resp: _0 Temp:    97.3 F (36.3 C)  TempSrc:    Oral  SpO2: 99% 100% 100% 99%  Weight:      Height:        Inpatient medications: . aspirin  325 mg Per NG tube Daily  . calcitRIOL  1.3 mcg Intravenous Q M,W,F-HD  . carvedilol  3.125 mg Oral BID WC  . cetaphil   Topical BID  . chlorhexidine gluconate (MEDLINE KIT)  15 mL Mouth Rinse BID  . darbepoetin (ARANESP) injection - DIALYSIS  150 mcg Intravenous Q Wed-HD  . dextrose  50 mL Intravenous Once  . feeding supplement (NEPRO CARB STEADY)  1,000 mL Per Tube Q24H  . feeding supplement (PRO-STAT SUGAR FREE 64)  30 mL Per Tube TID  . heparin  2,000 Units Dialysis Once in dialysis  . heparin  5,000 Units Subcutaneous Q8H  . isosorbide-hydrALAZINE  1 tablet Oral TID  . mouth rinse  15 mL Mouth Rinse 10 times per day  . mupirocin cream   Topical BID  . pantoprazole sodium  40 mg Per Tube Daily  . sodium chloride flush  10-40 mL Intracatheter Q12H   . sodium chloride 10 mL/hr at 12/01/16 0800  . dextrose 40 mL/hr at 11/25/16 1900   sodium chloride, sodium chloride, alteplase, fentaNYL (SUBLIMAZE) injection, heparin, heparin, lidocaine (PF), lidocaine-prilocaine, pentafluoroprop-tetrafluoroeth, sodium chloride flush  Exam: General: on vent, much more alert than yesterday  HEENT: intubated/oral ET tub Lungs:  clear bilat ant/ lat Heart: RRR  Abdomen:  soft dim BS, right fem central line Ext: mild LE/ UE edema Neuro:  unresponsive Dialysis Access: left upper AVF + bruit  Dialysis Orders: MWF [facility]   3h 1mn   77kg  2/2 bath  Hep 2000   LUA AVF Mircera 75 q 2 weeks-lst 11/15  Calcitriol 1.25 - iPTH 190 - Ca usually 9s and P in goal  Assessment: 1. SP cardiac arrest- V fib arrest- recovered  EF 2. VDRF - per CCM- now that more alert- trying to get in place where could extubate  3. AMS - more alert  4. ESRD- usual HD MWF- via AVF- plan for treatment today  5. Volume - vol is up- 3 liters off Mon- will plan for the same or more today 6. Hypotension - resolved 7. Anemia  - hgb 8-9- now 7's , cont darbe 60/wk- will increase, transfuse prn- hgb 8 today 8. Metabolic bone disease -  Continue calcitriol - converted to IV- no binder phos is low 9. EOL - DNR- however, seems much better today form previous reports       Ronald Brewer A  12/01/2016, 8:40 AM    Recent Labs Lab 11/26/16 0515  11/29/16 1100 11/30/16 0404 12/01/16 0242  NA 131*  < > 135 134* 136  K 3.7  < > 3.1* 3.2* 3.2*  CL 95*  < > 98* 96* 97*  CO2 25  < > _1 GLUCOSE 106*  < > 125* 95 107*  BUN 14  < > 42* 25* 44*  CREATININE 4.49*  < > 4.80* 2.98* 3.92*  CALCIUM 7.1*  < > 8.0* 8.2* 8.6*  PHOS 2.5  --  2.4*  --  2.6  < > =  values in this interval not displayed.  Recent Labs Lab 11/25/16 1200 11/29/16 1100  ALBUMIN 1.6* 1.7*    Recent Labs Lab 11/29/16 1100 11/30/16 0404 12/01/16 0242  WBC 14.5* 12.3* 9.3  HGB 7.3* 7.7* 8.0*  HCT 22.1* 23.0* 23.8*  MCV 82.5 83.3 83.2  PLT 77* 94* 132*   Iron/TIBC/Ferritin/ %Sat No results found for: IRON, TIBC, FERRITIN, IRONPCTSAT

## 2016-12-01 NOTE — Progress Notes (Signed)
Nutrition Follow-up  INTERVENTION:   If remains intubated continue: Nepro @ 30 ml/hr (720 ml/day) 30 ml Prostat TID Provides: 1596 kcal, 103 grams protein, and 523 ml H2O.   NUTRITION DIAGNOSIS:   Inadequate oral intake related to inability to eat as evidenced by NPO status. Ongoing.   GOAL:   Patient will meet greater than or equal to 90% of their needs Met.   MONITOR:   TF tolerance, Skin, I & O's, Labs  ASSESSMENT:   Pt with hx of DM, ESRD on HD admitted after witnessed cardiac arrest at dialysis 11/27 in setting of prolonged QT and postdialysis electrolyte shifts.   Plan for extubation today after HD, per palliative wife declines re-intubation and ICD. Palliative following. Currently has OG in place for feedings, currently held for extubation.   Patient is currently intubated on ventilator support MV: 10.3 L/min Temp (24hrs), Avg:97.3 F (36.3 C), Min:97 F (36.1 C), Max:97.7 F (36.5 C)  Labs reviewed: K+ 3.2, PO4 WNL CBG's: [phone]   Diet Order:    NPO  Skin:  Wound (see comment) (BLE DM wounds, MASD buttocks)  Last BM:  0 ml recorded for last 24 hours via rectal pouch  Height:   Ht Readings from Last 1 Encounters:  11/25/2016 5' 10"  (1.778 m)    Weight:   Wt Readings from Last 1 Encounters:  12/01/16 170 lb 10.2 oz (77.4 kg)    Ideal Body Weight:  75.4 kg  BMI:  Body mass index is 24.48 kg/m.  Estimated Nutritional Needs:   Kcal:  1623  Protein:  100-115 grams  Fluid:  > 1.6 L/day  EDUCATION NEEDS:   No education needs identified at this time  [place], [place], [place] Pager [phone] After Hours Pager

## 2016-12-01 NOTE — Progress Notes (Signed)
Arrived to patient room 4N-16 at 1045.  Reviewed treatment plan and this RN agrees.  Report received from bedside RN, Melody.  Consent verified.  Patient Responds to voice, commands. Lung sounds clear to ausculation in all fields. Moderate BL upper and lower extremity 2+ pitting edema. Cardiac: NSR.  Prepped LUAVF with alcohol and cannulated with two 15 gauge needles.  Pulsation of blood noted.  Flushed access well with saline per protocol.  Connected and secured lines and initiated tx at 1058.  UF goal of 4500 mL and net fluid removal of 4000 mL.  Will continue to monitor.

## 2016-12-02 ENCOUNTER — Inpatient Hospital Stay (HOSPITAL_COMMUNITY): Payer: Medicare Other

## 2016-12-02 DIAGNOSIS — I951 Orthostatic hypotension: Secondary | ICD-10-CM

## 2016-12-02 LAB — GLUCOSE, CAPILLARY
GLUCOSE-CAPILLARY: 104 mg/dL — AB (ref 65–99)
Glucose-Capillary: 114 mg/dL — ABNORMAL HIGH (ref 65–99)
Glucose-Capillary: 106 mg/dL — ABNORMAL HIGH (ref 65–99)

## 2016-12-02 LAB — BASIC METABOLIC PANEL
ANION GAP: 9 (ref 5–15)
BUN: 27 mg/dL — ABNORMAL HIGH (ref 6–20)
CO2: 30 mmol/L (ref 22–32)
Calcium: 8.5 mg/dL — ABNORMAL LOW (ref 8.9–10.3)
Chloride: 96 mmol/L — ABNORMAL LOW (ref 101–111)
Creatinine, Ser: 2.65 mg/dL — ABNORMAL HIGH (ref 0.61–1.24)
GFR calc Af Amer: 26 mL/min — ABNORMAL LOW (ref >60)
GFR, EST NON AFRICAN AMERICAN: 22 mL/min — AB (ref >60)
Glucose, Bld: 107 mg/dL — ABNORMAL HIGH (ref 65–99)
POTASSIUM: 3.6 mmol/L (ref 3.5–5.1)
SODIUM: 135 mmol/L (ref 135–145)

## 2016-12-02 LAB — MAGNESIUM: MAGNESIUM: 2.1 mg/dL (ref 1.7–2.4)

## 2016-12-02 LAB — PHOSPHORUS: PHOSPHORUS: 2.2 mg/dL — AB (ref 2.5–4.6)

## 2016-12-02 MED ORDER — ORAL CARE MOUTH RINSE: 15.0000 mL | Freq: Two times a day (BID) | OROMUCOSAL | Status: DC

## 2016-12-02 MED ORDER — CARVEDILOL 3.125 MG PO TABS
3.1250 mg | ORAL_TABLET | Freq: Two times a day (BID) | ORAL | Status: DC
  Administered 2016-12-02: 3.125 mg via ORAL
  Filled 2016-12-02: qty 1

## 2016-12-02 MED ORDER — CAMPHOR-MENTHOL 0.5-0.5 % EX LOTN
TOPICAL_LOTION | Freq: Two times a day (BID) | CUTANEOUS | Status: DC
  Filled 2016-12-02: qty 222

## 2016-12-08 LAB — GLUCOSE, CAPILLARY
GLUCOSE-CAPILLARY: 120 mg/dL — AB (ref 65–99)
GLUCOSE-CAPILLARY: 104 mg/dL — AB (ref 65–99)
Glucose-Capillary: 105 mg/dL — ABNORMAL HIGH (ref 65–99)

## 2016-12-10 ENCOUNTER — Telehealth: Payer: Self-pay

## 2016-12-10 NOTE — Telephone Encounter (Signed)
On 12/10/2016 I received a death certificate from [facility] (original). The death certificate is for cremation. The patient is a patient of Doctor Elsworth Soho. The death certificate will be taken to Pulmonary at [facility] for signature.  On 12/21/16 I received the death certificate back from Doctor Byrum. I got the death certificate ready and called the funeral home to let them know the death certificate is ready for pickup.

## 2016-12-27 NOTE — Progress Notes (Signed)
Patient Name: Ronald Brewer Date of Encounter: 12-31-2016  Primary Cardiologist: Dr. 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    Inpatient Medications    Scheduled Meds: . aspirin  325 mg Per NG tube Daily  . calcitRIOL  1.3 mcg Intravenous Q M,W,F-HD  . cetaphil   Topical BID  . chlorhexidine gluconate (MEDLINE KIT)  15 mL Mouth Rinse BID  . darbepoetin (ARANESP) injection - DIALYSIS  150 mcg Intravenous Q Wed-HD  . dextrose  50 mL Intravenous Once  . feeding supplement (NEPRO CARB STEADY)  1,000 mL Per Tube Q24H  . feeding supplement (PRO-STAT SUGAR FREE 64)  30 mL Per Tube TID  . heparin  2,000 Units Dialysis Once in dialysis  . heparin  5,000 Units Subcutaneous Q8H  . mouth rinse  15 mL Mouth Rinse 10 times per day  . mupirocin cream   Topical BID  . pantoprazole sodium  40 mg Per Tube Daily  . sodium chloride flush  10-40 mL Intracatheter Q12H   Continuous Infusions: . sodium chloride 10 mL/hr at 12/01/16 1800  . dextrose 40 mL/hr at 11/25/16 1900   PRN Meds: sodium chloride, sodium chloride, alteplase, fentaNYL (SUBLIMAZE) injection, heparin, heparin, lidocaine (PF), lidocaine-prilocaine, pentafluoroprop-tetrafluoroeth, sodium chloride flush   Vital Signs    Vitals:   12/31/16 0600 2016/12/31 0700 12-31-2016 0749 Dec 31, 2016 0800  BP: (!) 104/51 (!) 88/53  (!) 114/48  Pulse:  69  74  Resp: _0 Temp:      TempSrc:      SpO2: 100% 99% 100% 100%  Weight:      Height:        Intake/Output Summary (Last 24 hours) at 2016-12-31 0831 Last data filed at Dec 31, 2016 0600  Gross per 24 hour  Intake              667 ml  Output             4000  ml  Net            -3333 ml   Filed Weights   12/01/16 1045 12/01/16 1458 2016/12/31 0400  Weight: 81.4 kg (179 lb 7.3 oz) 77.4 kg (170 lb 10.2 oz) 77.8 kg (171 lb 8.3 oz)    Physical Exam    GEN: Critically ill.  Intubated. In no acute distress.  HEENT: Grossly normal.  Neck: Supple, no carotid bruits, or masses. Cardiac: RRR, no murmurs, rubs, or gallops. No clubbing, cyanosis, 1+ pitting edema in bilateral upper extremities.  No lower extremity edema.  Radials/DP/PT 2+ and equal bilaterally.  Respiratory:  Respirations regular and unlabored, clear to auscultation bilaterally. GI: Soft, nontender, nondistended, BS + x 4. MS: no deformity or atrophy. Skin: warm and dry, no rash. Neuro:  Spontaneous, purposeful movement of bilateral upper and lower extremities.  Following commands.  Nodding appropriately to questions.  Labs    CBC  Recent Labs  11/30/16 0404 12/01/16 0242  WBC 12.3* 9.3  HGB 7.7* 8.0*  HCT 23.0* 23.8*  MCV 83.3 83.2  PLT 94* 219*   Basic Metabolic Panel  Recent Labs  12/01/16 0242 2016-12-04 0319  NA 136 135  K 3.2* 3.6  CL 97* 96*  CO2 31 30  GLUCOSE 107* 107*  BUN 44* 27*  CREATININE 3.92* 2.65*  CALCIUM 8.6* 8.5*  MG 2.0 2.1  PHOS 2.6 2.2*   Liver Function Tests  Recent Labs  11/29/16 1100  ALBUMIN 1.7*   No results for input(s): LIPASE, AMYLASE in the last 72 hours. Cardiac Enzymes No results for input(s): CKTOTAL, CKMB, CKMBINDEX, TROPONINI in the last 72 hours. BNP Invalid input(s): POCBNP D-Dimer No results for input(s): DDIMER in the last 72 hours. Hemoglobin A1C No results for input(s): HGBA1C in the last 72 hours. Fasting Lipid Panel No results for input(s): CHOL, HDL, LDLCALC, TRIG, CHOLHDL, LDLDIRECT in the last 72 hours. Thyroid Function Tests No results for input(s): TSH, T4TOTAL, T3FREE, THYROIDAB in the last 72 hours.  Invalid input(s): FREET3  Telemetry    5 beats of NSVT and PVCs noted. - Personally  Reviewed  ECG    12/01/16: Sinus rhythm. Rate 73 bpm. RSR prime in V1. Anterolateral T-wave inversions concerning for ischemia.  LAFB. QTc 491.  Radiology    Dg Chest Port 1 View  Result Date: 12-04-2016 CLINICAL DATA:  adjustment or removal of myringotomy device (stent) (tube,hx htn,mi,cad,esrd EXAM: PORTABLE CHEST 1 VIEW COMPARISON:  Chest radiograph 11/30/2016. FINDINGS: ETT terminates in the mid trachea. Enteric tube courses inferior to the diaphragm. Side-port appears at the GE junction. Stable cardiac and mediastinal contours. Monitoring leads overlie the patient. Residual mild bilateral lower lung heterogeneous pulmonary opacities. No pleural effusion or pneumothorax. IMPRESSION: Stable heterogeneous opacities lung bases bilaterally. Stable support apparatus. Electronically Signed   By: Lovey Newcomer M.D.   On: 04-Dec-2016 08:25    Cardiac Studies   11/30/16 Echo: Study Conclusions  - Left ventricle: The cavity size was normal. Wall thickness was   increased in a pattern of mild LVH. Systolic function was normal.   The estimated ejection fraction was in the range of 55% to 60%.   Wall motion was normal; there were no regional wall motion   abnormalities. Features are consistent with a pseudonormal left   ventricular filling pattern, with concomitant abnormal relaxation   and increased filling pressure (grade 2 diastolic dysfunction). - Mitral valve: There was mild regurgitation. - Left atrium: The atrium was mildly dilated. - Right ventricle: The cavity size was mildly dilated. - Pulmonary arteries: Systolic pressure was moderately increased.   PA peak pressure: 63 mm Hg (S).  11/23/16 echo:  - Left ventricle: The cavity size was normal. Wall thickness was increased in a pattern of moderate LVH. There was moderate concentric hypertrophy. Systolic function was severely reduced. The estimated ejection fraction was in the range of 15% to 20%. There is akinesis of the  entireapical myocardium. There is akinesis of the apicalinferior and apical myocardium. Features are consistent with a pseudonormal left ventricular filling pattern, with concomitant abnormal relaxation and increased filling pressure (grade 2 diastolic dysfunction). Doppler parameters are consistent with high ventricular filling pressure. - Aortic valve: Structurally normal valve. Trileaflet. - Mitral valve: There was moderate regurgitation. - Left atrium: The atrium was moderately dilated. - Pulmonary arteries: Systolic pressure was moderately increased. PA peak pressure: 62 mm Hg (S).  07/23/2010 echo  - Left ventricle: The cavity size was normal. Wall thickness was  increased increased in a pattern of mild to moderate LVH. Systolic  function was normal. The estimated ejection fraction was in the  range of 50% to 55%.  Wall motion was normal; there were no  regional wall motion abnormalities. Doppler parameters are  consistent with abnormal left ventricular relaxation (grade 1  diastolic dysfunction). - Mitral valve: Mild regurgitation.  Patient Profile     Mr. Walther is a 75M with ESRD on HD, DM, and hypertension here with VF arrest, severely prolonged QT in the setting of hypokalemia.  LVEF severely depressed post arrest.  He has undergone therapeutic hypothermia.    Assessment & Plan    # VF arrest: # [place]: QTc Down to 491 from 559 on 11/27/16.  Avoid QT prolonging medications.  He was also hypokalemic at the time of his arrest (potassium was 3.0 on 11/09/16).  Given the recovery in his EF and lack of wall motion abnormalities, we'll likely plan for a Myoview once he is extubated and stable.  If negative for ischemia and there is significant neurological recovery, we will consult EP for discussion about ICD, if this is something he would desire.  He had a short run of NSVT overnight.   # Acute systolic and diastolic heart failure: # Severe  pulmonary hypertension: LVEF was 15-20% following VF arrest. Repeat echo 11/29/16 revealed complete recovery of LVEF with an ejection fraction of 55-60% and grade 2 diastolic dysfunction. He was noted to have severe pulmonary hypertension with PASP of 63 mmHg. Of note, this was while on the ventilator.  Bidil was stopped and carvedilol was increased to 6.48m.  However, BP has been low. We will reduce it back to 3.1240m  # Cardiogenic shock: Resolved.  No longer requiring pressors.    # Respiratory failure:  Remains intubated.  Per critical care.  # ESRD:  HD MWF.  # Anoxic encephalopathy: Mr. HaSoderlunds meaningfully responding to questions and  following commands. Given this new finding and his improvement in LVEF, his prognosis seems much better, though still guarded.    Signed, TiSkeet LatchMD  12Dec 16, 20178:31 AM

## 2016-12-27 NOTE — Care Management Note (Signed)
Case Management Note  Patient Details  Name: Ronald Brewer MRN: [id number] Date of Birth: Jun 06, 1941  Subjective/Objective:   Pt admitted with cardiac arrest post HD                 Action/Plan:  PTA from home with wife - per wife pt was independent at home and used [employer] bus for transportation   Expected Discharge Date:                  Expected Discharge Plan:  [place]  In-House Referral:  Hospice / [facility], Clinical Social Work  Discharge planning Services  CM Consult  Post Acute Care Choice:    Choice offered to:     DME Arranged:    DME Agency:     HH Arranged:    [place] Agency:     Status of Service:  In process, will continue to follow  If discussed at Long Length of Stay Meetings, dates discussed:    Additional Comments: 12-17-16 Pt extubated to [place] today.  Palliative following.  PT and OT eval pending Maryclare Labrador, RN 12-17-16, 3:34 PM

## 2016-12-27 NOTE — Progress Notes (Signed)
PULMONARY / CRITICAL CARE MEDICINE   Name: Ronald Brewer MRN: [id number] DOB: 15-Aug-1941    ADMISSION DATE:  11/16/2016 CONSULTATION DATE:  12/11/16   CHIEF COMPLAINT:  Cardiac arrest  HISTORY OF PRESENT ILLNESS:   76 year old man with ESRD on hemodialysis and CAD was brought in by EMS after a witnessed cardiac arrest at dialysis 11/27. He finished his treatment, then had a syncopal episode and vomited, again syncope with loss of pulse-shock advised the AED. After EMS arrival, noted to be in V. fib given epinephrine 1 with ROSC, total CPR time about 15 minutes. He is a diabetic hypertensive neck: 2011 shows normal ejection fraction He was admitted 10/2016 for generalized weakness, black stools and hemoglobin about 9 attributed to anemia of chronic disease He remained unresponsive after arrival to the emergency room had nonpurposeful movement of both upper extremities EKG showed prolonged QT, frequent PVCs  SUBJECTIVE:  Continues to have runs of tachyarrhythmia, SVT Tolerating PSV now although Vt a bit small. He will take a deep breath and cough w prompting.  Underwent HD yesterday 12/6  VITAL SIGNS: BP (!) 145/58   Pulse 74   Temp 97.6 F (36.4 C) (Oral)   Resp (!) 22   Ht 5\' 10"  (1.778 m)   Wt 77.8 kg (171 lb 8.3 oz)   SpO2 100%   BMI 24.61 kg/m   HEMODYNAMICS:    VENTILATOR SETTINGS: Vent Mode: CPAP;PSV FiO2 (%):  [30 %] 30 % Set Rate:  [15 bmp] 15 bmp Vt Set:  [450 mL] 450 mL PEEP:  [5 cmH20] 5 cmH20 Pressure Support:  [8 cmH20] 8 cmH20 Plateau Pressure:  [17 cmH20-19 cmH20] 17 cmH20  INTAKE / OUTPUT: I/O last 3 completed shifts: In: 1387 [I.V.:370; NG/GT:1017] Out: 4000 [Other:4000]  PHYSICAL EXAMINATION:   General:  chronically ill-appearing elderly  intubated Neuro:  Significant improvement, following commands, nods to questions, good strength HEENT:  Oral ET tube, pupils 3 mm bilaterally equally reactive to light  S1-S2 normal, no rub, no  murmur Decreased breath sounds bilateral, no rhonchi   soft and nontender abdomen Extremities-left AV fistula with thrill, right femoral CVL. B UE edema, B pedal edema    LABS:  BMET  Recent Labs Lab 11/30/16 0404 12/01/16 0242 12-11-16 0319  NA 134* 136 135  K 3.2* 3.2* 3.6  CL 96* 97* 96*  CO2 30 31 30   BUN 25* 44* 27*  CREATININE 2.98* 3.92* 2.65*  GLUCOSE 95 107* 107*    Electrolytes  Recent Labs Lab 11/26/16 0515  11/29/16 1100 11/30/16 0404 12/01/16 0242 12-11-16 0319  CALCIUM 7.1*  < > 8.0* 8.2* 8.6* 8.5*  MG 1.6*  --   --   --  2.0 2.1  PHOS 2.5  --  2.4*  --  2.6 2.2*  < > = values in this interval not displayed.  CBC  Recent Labs Lab 11/29/16 1100 11/30/16 0404 12/01/16 0242  WBC 14.5* 12.3* 9.3  HGB 7.3* 7.7* 8.0*  HCT 22.1* 23.0* 23.8*  PLT 77* 94* 132*    Coag's No results for input(s): APTT, INR in the last 168 hours.  Sepsis Markers No results for input(s): LATICACIDVEN, PROCALCITON, O2SATVEN in the last 168 hours.  ABG  Recent Labs Lab 11/28/16 0935  PHART 7.390  PCO2ART 54.7*  PO2ART 178.0*    Liver Enzymes  Recent Labs Lab 11/25/16 1200 11/29/16 1100  ALBUMIN 1.6* 1.7*    Cardiac Enzymes No results for input(s): TROPONINI, PROBNP in the last 168  hours.  Glucose  Recent Labs Lab 12/01/16 1103 12/01/16 1600 12/01/16 2015 12/01/16 2317 December 26, 2016 0412 12-26-2016 0752  GLUCAP 111* 73 70 81 114* 106*    Imaging Dg Chest Port 1 View  Result Date: 12-26-2016 CLINICAL DATA:  adjustment or removal of myringotomy device (stent) (tube,hx htn,mi,cad,esrd EXAM: PORTABLE CHEST 1 VIEW COMPARISON:  Chest radiograph 11/30/2016. FINDINGS: ETT terminates in the mid trachea. Enteric tube courses inferior to the diaphragm. Side-port appears at the GE junction. Stable cardiac and mediastinal contours. Monitoring leads overlie the patient. Residual mild bilateral lower lung heterogeneous pulmonary opacities. No pleural effusion or  pneumothorax. IMPRESSION: Stable heterogeneous opacities lung bases bilaterally. Stable support apparatus. Electronically Signed   By: Lovey Newcomer M.D.   On: 12-26-2016 08:25     STUDIES:  Echo 11/28>> EF 20%, WMA +, gr 2 DD, RVSP 62  CULTURES:  ANTIBIOTICS:  SIGNIFICANT EVENTS: 11/27 admit  LINES/TUBES: 11/27 rt fem CVL >> 11/27 ETT >>  DISCUSSION:  Cardiac arrest due to ventricular fibrillation in the setting of prolonged QT this may be due to postdialysis shift electrolytes , medication such as Zoloft, intrinsic cardiac disease, acute ischemia less likely   PULMONARY A:  Acute hypoxemic respiratory failure  2/2 unable to protect airway 2/2 cardiac arrest, pulm edema.  P:   Cont vent support. Continue PSV, should be an extubation candidate today. Appreciate Palliative Care input. Will plan for an extubation without reintubation should he fail. pulm hygiene BD's  CARDIOVASCULAR A:  S/P ventricular fibrillation / torsades Prolonged QT -610 11/28 Cardiogenic shock. Resolved.  P:  Cardiology following. Appreciate recs.  Follow QTc Replete electrolytes May need defibrillator - to be discussed w him by cardiology once acute issues resolved. His philosophy for his care will be a factor here - he may not want an intervention.    RENAL A:   ESRD P:   HD intermittent, MWF Nephrology following.  Following BMP  GI A:   no issues P:   protonix  Ct TFs  Heme A:   anemia of chronic disease P:  Follow  ID A:   Leukocytosis resolved P:   Following off abx, no fever   EndoCRINE A:   DM-2   P:   SSI -mod   NEUROLOGIC A:   Anoxic encephalopathy 2/2 Cardiac arrest. MS improved 12/5 - 12/7 am, should indicate a significant improvement in overall prognosis Head CT 11/30 non diagnostic P:   RASS goal:0  Off sedation for neuro checks    FAMILY  - Updates:  M. York with Palliative Care spoke with patient's wife this am 12/6, notified her that there is a  window of opportunity for extubation. She also clarified his code status, that he would not want to be reintubated should he fail.   - Inter-disciplinary family meet or Palliative Care meeting due by:  Ongoing     Summary -  Rewarmed, shock resolved ,at risk for further arrhythmias due to prolonged QT, beginning to wake up. Hopefully extubate 12/7. HD ongoing. Further cards eval to be determined by overall recovery and prognosis.    The patient is critically ill with multiple organ systems failure and requires high complexity decision making for assessment and support, frequent evaluation and titration of therapies, application of advanced monitoring technologies and extensive interpretation of multiple databases. Critical Care Time devoted to patient care services described in this note independent of APP time is 35 minutes.    Baltazar Apo, MD, PhD December 26, 2016, 10:32 AM [place]  [facility] [phone] or if no answer [phone]

## 2016-12-27 NOTE — Progress Notes (Signed)
ET tube previously documented at 28 @ lip.  ET tube was found at 23 @ lip.  Pt was good volumes on vent and equal bilateral breath sounds.  RN notified and will call for a chest xray to confirm placement.  RT will continue to monitor.

## 2016-12-27 NOTE — Progress Notes (Addendum)
Pt's wife at [facility], brought by [employer] due to inability to reach via telephone(rode BUS), SW called to accompany wife downstairs cab voucher given to wife to travel home. All questions answered, emotional support given, pt's will call bed control with [employer], pt will be prepped and  transported to the [facility] for pickup.Belongings given to wife.

## 2016-12-27 NOTE — Procedures (Signed)
Extubation Procedure Note  Patient Details:   Name: [name] DOB: [birth year] MRN: [id number]   Airway Documentation:   + air leak test prior to extubation.  Evaluation  O2 sats: stable throughout Complications: No apparent complications Patient did tolerate procedure well. Bilateral Breath Sounds: Clear, Diminished   Yes, pt able to speak.  No distress noted, no stridor noted.  RN at bedside.    Lenna Sciara 2016/12/04, 11:11 AM

## 2016-12-27 NOTE — Progress Notes (Signed)
Called to pt's room, Pt obs with agonal bxing and HR 30-40, pt's wife left to go home via bus at 1600, this RN attempted to call listed home phone number without answer unable to leave message, no cell phone listed  Black box called [phone], CCM MD aware, pt is DNR  Pt pronounced TOD at 1652 by this RN and Newman Nickels  Called on call Palliative MD with update, stated would reach out to Echo who communicated with wife today for any other phone numbers  Will wait until pt's wife is updated and death checklist is done

## 2016-12-27 NOTE — Progress Notes (Signed)
[facility] Progress Note   Subjective: Hd yest - removed 4000- some low BP but rebounded. Extubated just now- pt confused  Vitals:   12-15-2016 0700 December 15, 2016 0749 12/15/2016 0800 12-15-16 0900  BP: (!) 88/53  (!) 114/48 (!) 145/58  Pulse: 69  74 74  Resp: 19  18 (!) 22  Temp:      TempSrc:      SpO2: 99% 100% 100% 100%  Weight:      Height:        Inpatient medications: . aspirin  325 mg Per NG tube Daily  . calcitRIOL  1.3 mcg Intravenous Q M,W,F-HD  . carvedilol  3.125 mg Oral BID WC  . cetaphil   Topical BID  . chlorhexidine gluconate (MEDLINE KIT)  15 mL Mouth Rinse BID  . darbepoetin (ARANESP) injection - DIALYSIS  150 mcg Intravenous Q Wed-HD  . dextrose  50 mL Intravenous Once  . feeding supplement (NEPRO CARB STEADY)  1,000 mL Per Tube Q24H  . feeding supplement (PRO-STAT SUGAR FREE 64)  30 mL Per Tube TID  . heparin  2,000 Units Dialysis Once in dialysis  . heparin  5,000 Units Subcutaneous Q8H  . mouth rinse  15 mL Mouth Rinse 10 times per day  . mupirocin cream   Topical BID  . pantoprazole sodium  40 mg Per Tube Daily  . sodium chloride flush  10-40 mL Intracatheter Q12H   . sodium chloride 10 mL/hr at 12/01/16 1800  . dextrose 40 mL/hr at 11/25/16 1900   sodium chloride, sodium chloride, alteplase, fentaNYL (SUBLIMAZE) injection, heparin, heparin, lidocaine (PF), lidocaine-prilocaine, pentafluoroprop-tetrafluoroeth, sodium chloride flush  Exam: General: just extubated- confused  Lungs:  clear bilat ant/ lat Heart: RRR  Abdomen:  soft dim BS, right fem central line Ext: mild LE/ UE edema Neuro:  unresponsive Dialysis Access: left upper AVF + bruit  Dialysis Orders: MWF [facility]   3h 25mn   77kg  2/2 bath  Hep 2000   LUA AVF Mircera 75 q 2 weeks-lst 11/15  Calcitriol 1.25 - iPTH 190 - Ca usually 9s and P in goal  Assessment: 1. SP cardiac arrest- V fib arrest- recovered EF 2. VDRF - per CCM- now that more alert- extiubate 3. AMS - more  alert  4. ESRD- usual HD MWF- via AVF- plan for treatment tomorrow  5. Volume - vol is up- 3 liters off Mon-4 on Wed- will plan for the same or more tomorrow 6. Hypotension - resolved 7. Anemia  - hgb 8-9- now 7's , cont darbe will increased dose, transfuse prn- hgb 8 today 8. Metabolic bone disease -  Continue calcitriol - converted to IV- no binder phos is low 9. EOL - DNR-       Cecila Satcher A  1Dec 20, 2017 11:07 AM    Recent Labs Lab 11/29/16 1100 11/30/16 0404 12/01/16 0242 [id number]  NA 135 134* 136 135  K 3.1* 3.2* 3.2* 3.6  CL 98* 96* 97* 96*  CO2 29 30 31 30   GLUCOSE 125* 95 107* 107*  BUN 42* 25* 44* 27*  CREATININE 4.80* 2.98* 3.92* 2.65*  CALCIUM 8.0* 8.2* 8.6* 8.5*  PHOS 2.4*  --  2.6 2.2*    Recent Labs Lab 11/25/16 1200 11/29/16 1100  ALBUMIN 1.6* 1.7*    Recent Labs Lab 11/29/16 1100 11/30/16 0404 12/01/16 0242  WBC 14.5* 12.3* 9.3  HGB 7.3* 7.7* 8.0*  HCT 22.1* 23.0* 23.8*  MCV 82.5 83.3 83.2  PLT 77* 94*  132*   Iron/TIBC/Ferritin/ %Sat No results found for: IRON, TIBC, FERRITIN, IRONPCTSAT

## 2016-12-27 NOTE — Progress Notes (Signed)
Daily Progress Note   Patient Name: Ronald Brewer       Date: 25-Dec-2016 DOB: Jan 23, 1941  Age: 76 y.o. MRN#: [id number] Attending Physician: Rigoberto Noel, MD Primary Care Physician: Placido Sou, MD Admit Date: 11/13/2016  Reason for Consultation/Follow-up: Establishing goals of care and Psychosocial/spiritual support  Subjective: I met with Rev. Jefm Miles at bedside.  She is very encouraged that the ETT tube is out.  Dr. Lamonte Sakai explained that he is having some trouble managing his secretions and Tonia Ghent appears to understand that her husband is very weak and is having some trouble.  She asks if he can come home by Sat.  I explained that we have to see if he can swallow, then eat, then we need to see how much mobility he has, and we need to see if he can continue to tolerate effective hemodialysis.  Ida expressed concern that he is exhausted and drained after HD.  Sometimes he does not even recognize her.  She was concerned that something was not being done correctly in HD.  I explained that he has to be able to tolerate effective HD - so that his blood is properly cleaned.  I'm worried that Halvor may have a difficult time tolerating full, effective hemodialysis.  Tonia Ghent said she would like to talk with Dr. Jimmy Footman about that - she has great trust and confidence in him.  I promised to update her tomorrow.   Length of Stay: 10  Current Medications: Scheduled Meds:  . aspirin  325 mg Per NG tube Daily  . calcitRIOL  1.3 mcg Intravenous Q M,W,F-HD  . camphor-menthol   Topical BID  . carvedilol  3.125 mg Oral BID WC  . cetaphil   Topical BID  . chlorhexidine gluconate (MEDLINE KIT)  15 mL Mouth Rinse BID  . darbepoetin (ARANESP) injection - DIALYSIS  150 mcg Intravenous Q Wed-HD  . dextrose   50 mL Intravenous Once  . feeding supplement (NEPRO CARB STEADY)  1,000 mL Per Tube Q24H  . feeding supplement (PRO-STAT SUGAR FREE 64)  30 mL Per Tube TID  . heparin  2,000 Units Dialysis Once in dialysis  . heparin  5,000 Units Subcutaneous Q8H  . mouth rinse  15 mL Mouth Rinse 10 times per day  . mupirocin cream   Topical BID  .  pantoprazole sodium  40 mg Per Tube Daily  . sodium chloride flush  10-40 mL Intracatheter Q12H    Continuous Infusions: . sodium chloride 10 mL/hr at 12/01/16 1800  . dextrose 40 mL/hr at 11/25/16 1900    PRN Meds: sodium chloride, sodium chloride, alteplase, fentaNYL (SUBLIMAZE) injection, heparin, heparin, lidocaine (PF), lidocaine-prilocaine, pentafluoroprop-tetrafluoroeth, sodium chloride flush  Physical Exam  Constitutional: He appears well-developed.  Extubated.  Moving hands non-purposefully.  Frail chronically ill appearing.  HENT:  Head: Normocephalic and atraumatic.  Eyes: No scleral icterus.  Neck: Neck supple. No thyromegaly present.  Cardiovascular: Normal rate.   Murmur heard. Pulmonary/Chest: Effort normal.  On nasal canula.  No apparent distress. Weak cough.  Unable to clear secretions.  Abdominal: Soft. Bowel sounds are normal. He exhibits no distension. There is no tenderness.  Musculoskeletal: He exhibits edema.  Swelling noted in hands bilaterally. Able to move hands and feet on command.  Neurological:  Opens eyes to voice.  Responds to questions.  Follows commands.  Skin: Skin is warm and dry.  Multiple skin tears on arms and legs  Psychiatric:  Calm.            Vital Signs: BP (!) 145/58   Pulse 68   Temp 97.6 F (36.4 C) (Oral)   Resp (!) 22   Ht 5' 10"  (1.778 m)   Wt 77.8 kg (171 lb 8.3 oz)   SpO2 100%   BMI 24.61 kg/m  SpO2: SpO2: 100 % O2 Device: O2 Device: Nasal Cannula O2 Flow Rate: O2 Flow Rate (L/min): 4 L/min  Intake/output summary:   Intake/Output Summary (Last 24 hours) at 2016/12/21 1409 Last data  filed at Dec 21, 2016 0900  Gross per 24 hour  Intake              667 ml  Output             4000 ml  Net            -3333 ml   LBM: Last BM Date: 12/01/16 Baseline Weight: Weight: 77 kg (169 lb 12.1 oz) Most recent weight: Weight: 77.8 kg (171 lb 8.3 oz)       Palliative Assessment/Data:    Flowsheet Rows   Flowsheet Row Most Recent Value  Intake Tab  Referral Department  Critical care  Unit at Time of Referral  ICU  Palliative Care Primary Diagnosis  Neurology  Date Notified  11/25/16  Palliative Care Type  New Palliative care  Reason for referral  Clarify Goals of Care  Date of Admission  11/25/2016  Date first seen by Palliative Care  11/25/16  # of days Palliative referral response time  0 Day(s)  # of days IP prior to Palliative referral  3  Clinical Assessment  Palliative Performance Scale Score  20%  Psychosocial & Spiritual Assessment  Palliative Care Outcomes      Patient Active Problem List   Diagnosis Date Noted  . 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 Goals of care, counseling/discussion   . Palliative care encounter   . Acute respiratory failure ([place])   . Cardiac arrest with ventricular fibrillation ([place]) 11/04/2016  . Prolonged QT interval 11/21/2016  . Cardiac arrest ([place]) 11/14/2016  . Anemia 11/08/2016  . Hypokalemia 11/08/2016  . Chest pain 11/08/2016  . Neck pain 11/08/2016  . ESRD on dialysis ([place]) 09/18/2015  . 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 Ischemic stroke ([place]) 09/18/2015  . Headache 09/18/2015  . Polyneuropathy ([place])  09/18/2015  . Diplopia 09/18/2015    Palliative Care Assessment & Plan   Patient Profile: 76 y.o. male  with past medical history of CAD and MI, ESRD on HD, and DM who was admitted on 11/14/2016 with cardiac arrest in hemodialysis.  Mr. Shahin reportedly received 15 minutes of CPR including defibrillation.  He was in ICU intubated and unresponsive.  He is now off pressors and receiving hemodialysis.  His EEG shows  severe diffuse cerebral dysfunction. His echo shows G2 diastolic dysfunction with an EF of 15 - 20%.  On 12/7 he was extubated.  He is weak and having difficulty clearing secretions.    Recommendations/Plan: DNR/DNI Will continue to meet regularly with Liliana Cline Tonia Ghent) to update her and prepare her for future decisions. She is not inclined to move forward with ICD.  She feels if she prays for his heart it will improve. She wants him to continue to receive HD.  Goals of Care and Additional Recommendations:  Limitations on Scope of Treatment: Full Scope Treatment, DNR/DNI  Prognosis:   Unable to determine.    Discharge Planning:  To Be Determined  If he is successful in eating he will likely need SNF  Care plan discussed with Dr. Lamonte Sakai and bedside RN  Thank you for allowing the Palliative Medicine Team to assist in the care of this patient.   Time In: 12:00 Time Out: 12:35 Total Time 35 Prolonged Time Billed NO      Greater than 50%  of this time was spent counseling and coordinating care related to the above assessment and plan.  Imogene Burn, PA-C Palliative Medicine   Please contact Palliative MedicineTeam phone at [phone] for questions and concerns.   Please see AMION for individual provider pager numbers.

## 2016-12-27 DEATH — deceased

## 2017-01-12 IMAGING — CR DG CHEST 1V PORT
1 series · 1 of 1 positions shown · non-contrast
Comparison: November 26, 2016

CLINICAL DATA: Acute respiratory failure

EXAM:
PORTABLE CHEST 1 VIEW

[AP]
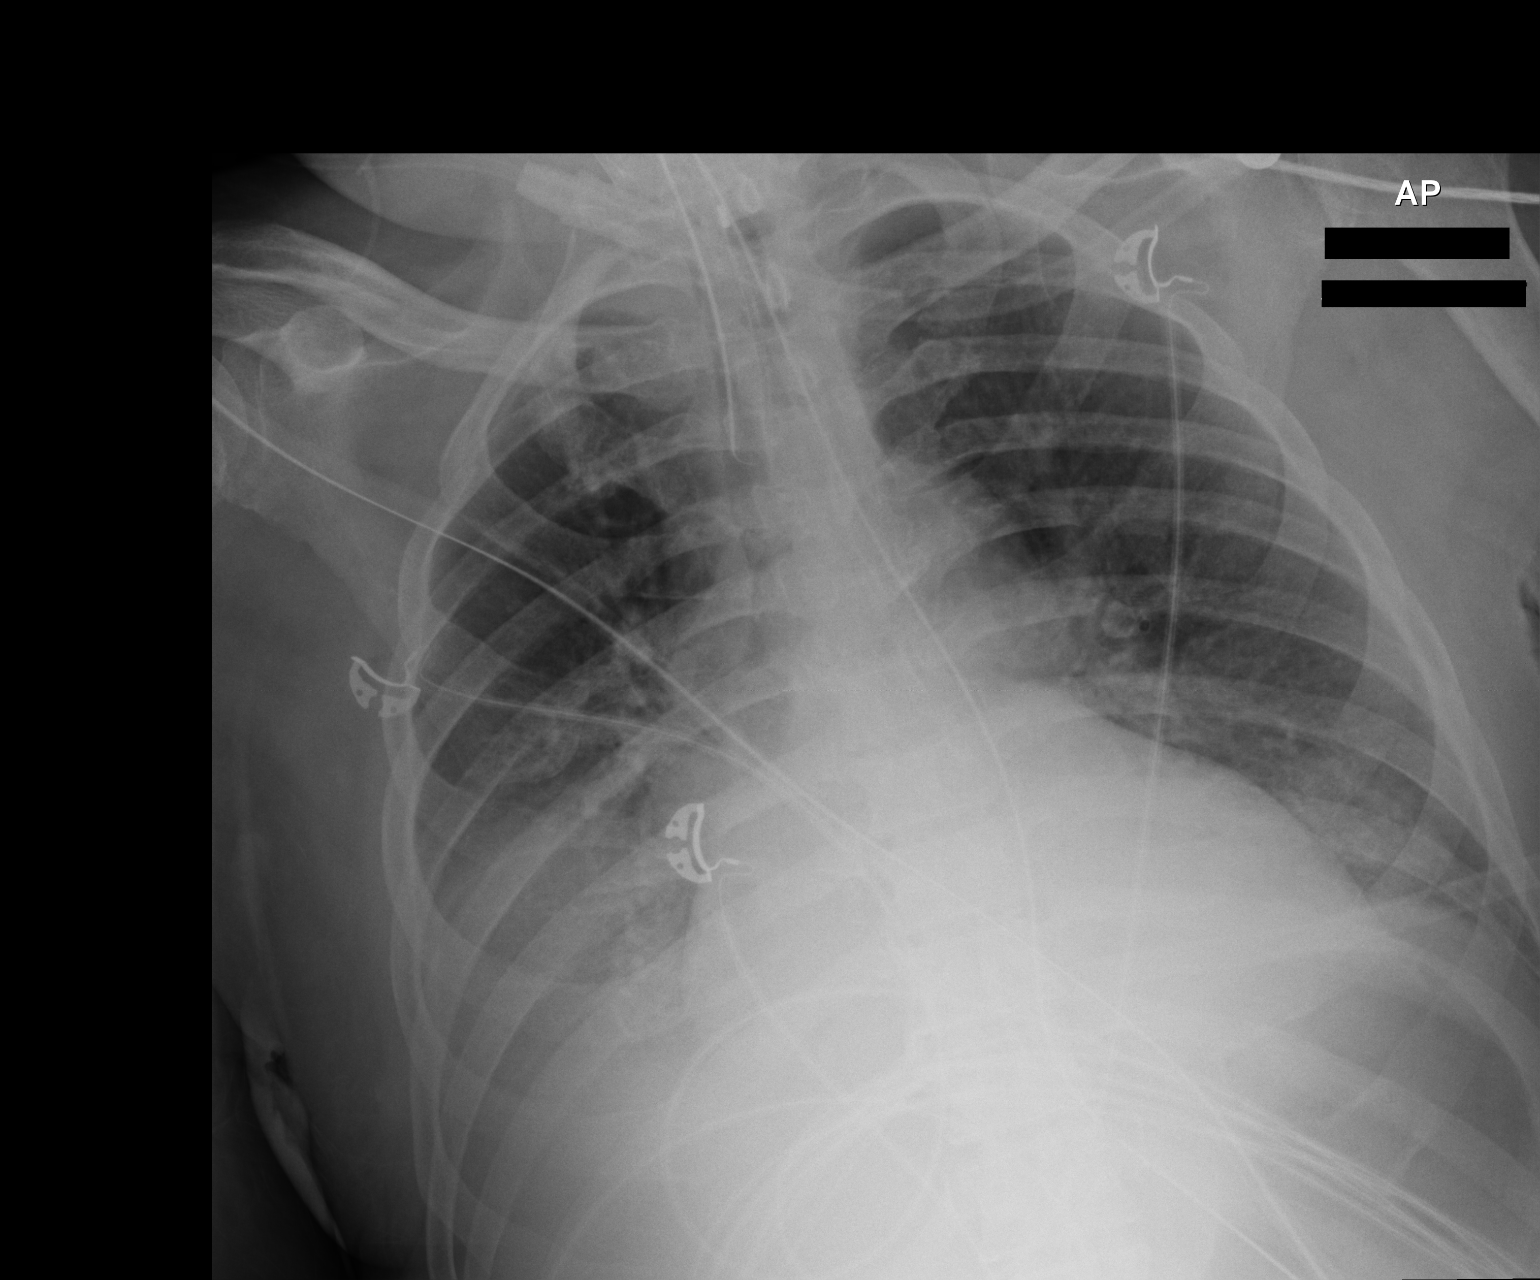

[1 of 1 positions shown; findings below may reference images not displayed]

FINDINGS: Stable support apparatus. Layering effusions and underlying
opacities are stable, right greater than left. Suspected mild edema.
IMPRESSION: No interval change in suspected mild edema and right greater than
left layering pleural effusions with underlying opacity. Stable
support apparatus.

## 2017-01-17 IMAGING — CR DG CHEST 1V PORT
1 series · 1 of 1 positions shown · non-contrast
Comparison: Chest radiograph 11/30/2016.

CLINICAL DATA: adjustment or removal of myringotomy device (stent)
(tube,hx htn,mi,cad,esrd

EXAM:
PORTABLE CHEST 1 VIEW

[AP]
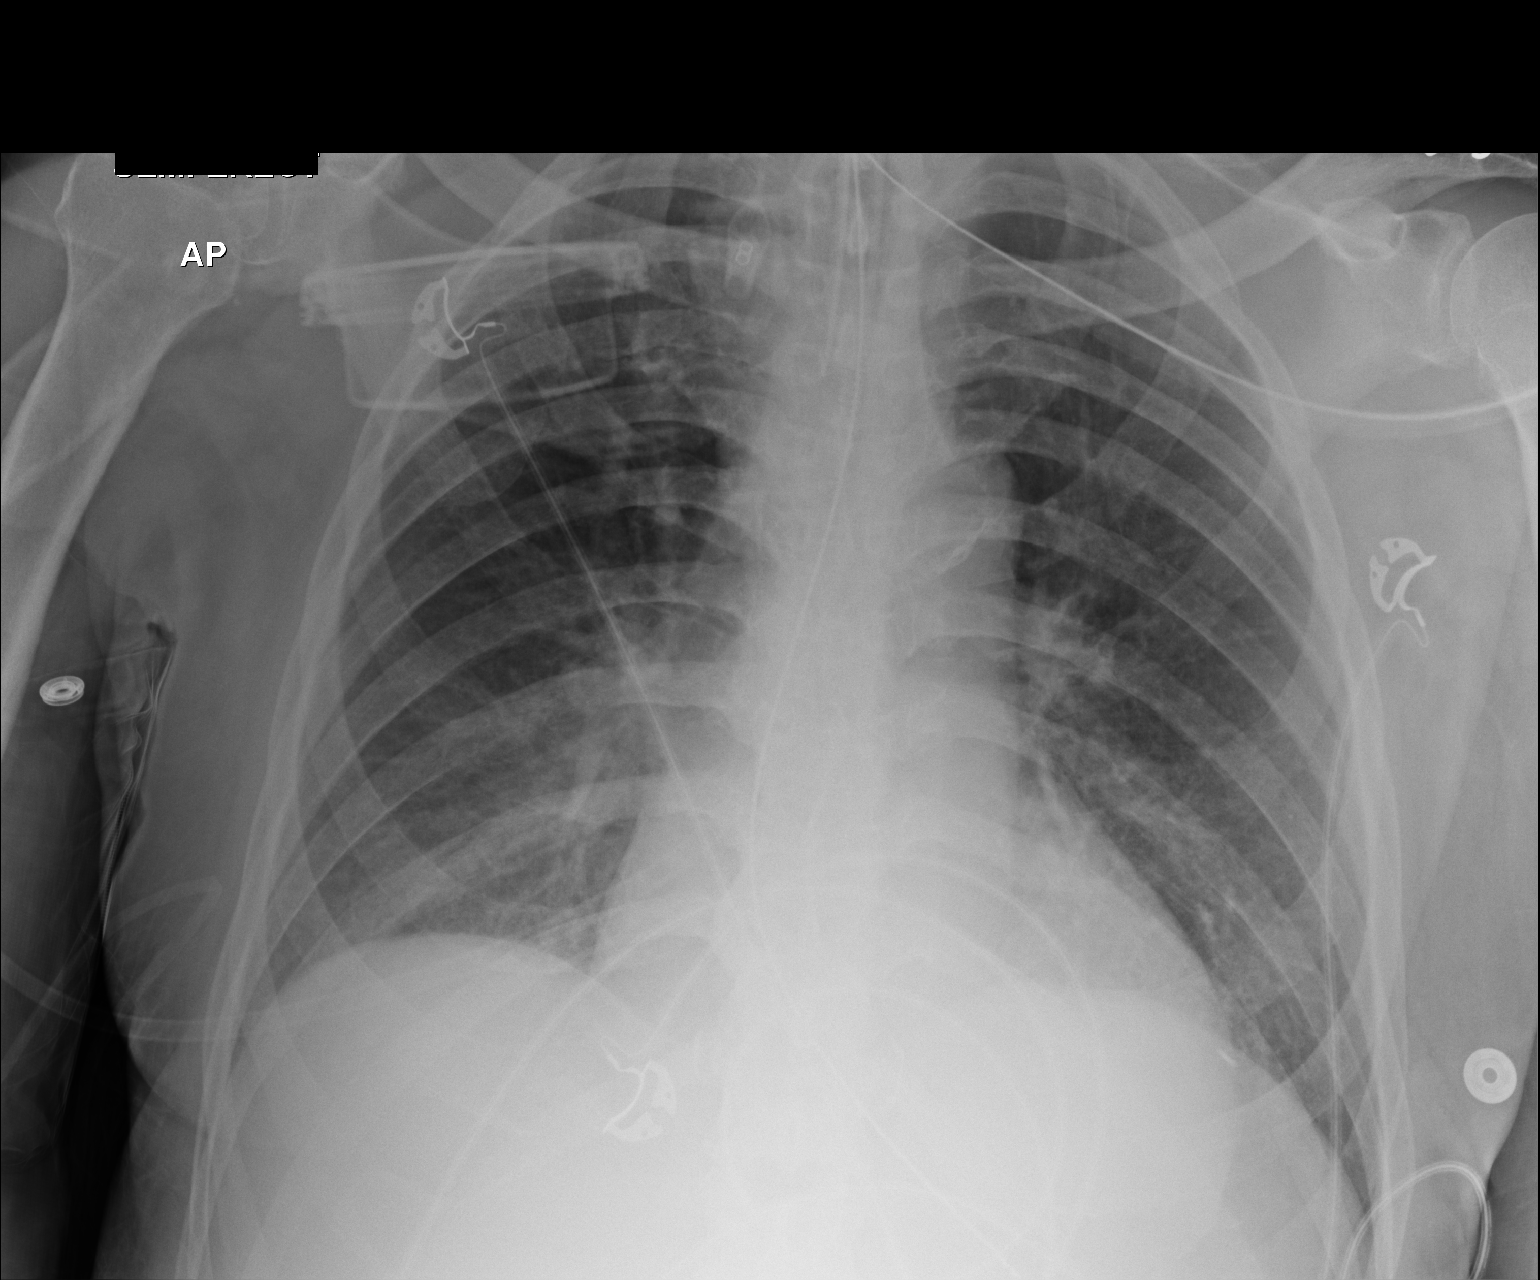

[1 of 1 positions shown; findings below may reference images not displayed]

FINDINGS: ETT terminates in the mid trachea. Enteric tube courses inferior to
the diaphragm. Side-port appears at the GE junction. Stable cardiac
and mediastinal contours. Monitoring leads overlie the patient.
Residual mild bilateral lower lung heterogeneous pulmonary
opacities. No pleural effusion or pneumothorax.
IMPRESSION: Stable heterogeneous opacities lung bases bilaterally.

Stable support apparatus.

## 2017-01-27 NOTE — Discharge Summary (Signed)
PULMONARY / CRITICAL CARE MEDICINE   Name: Ronald Brewer MRN: [id number] DOB: 1941-02-26    ADMISSION DATE:  11/23/2016 CONSULTATION DATE:  2017-01-07  DATE OF DEATH: 12/06/16  Final cause of death:  Acute Respiratory Failure  Secondary causes of death:  Ventricular tachycardia/cardiac arrest End-stage renal disease on hemodialysis Coronary artery disease Prolonged QT-c Cardiogenic shock Hyperkalemia Hypokalemia Anoxic encephalopathy [place]ena Anemia of chronic disease Diabetes mellitus Hypertension  HISTORY OF PRESENT ILLNESS / hospital course:   76 year old man with ESRD on hemodialysis, DM, CAD was brought in by EMS after a witnessed cardiac arrest at dialysis 11/27. He had finished his treatment, then had a syncopal episode and vomited, again syncope with loss of pulse. Found to be in ventricular tachycardia, torsades. He was defibrillated/cardioverted. The total CPR time about 15 minutes.  He remained unresponsive after arrival to the emergency room had nonpurposeful movement of both upper extremities EKG showed prolonged QT, frequent PVCs. He was intubated and received pressor support for per cardiogenic shock. His rhythm stabilized. Course was complicated by encephalopathy presumed due to hypoxia. He continued to have a prolonged QT interval. His electrolytes were corrected, he underwent hemodialysis per his usual required schedule. He slowly had some improvement in mental status and respiratory performance. Discussions were undertaken with palliative care assistance with patient's family. Given his improved respiration status decision was made to pursue an extubation with home for success but acknowledging that he would not want to be reintubated and supported for prolonged period of time. He was activated on 12-06-16 and for a few hours he performed well. He then began to show signs of tiring and poor airway protection. This progressed to an increased work to breathe. Given his  wishes no invasive interventions were made. He received medications for comfort. He expired 12/06/2016.  STUDIES:  Echo 11/28>> EF 20%, WMA +, gr 2 DD, RVSP 62  SIGNIFICANT EVENTS: 11/27 admit  LINES/TUBES: 11/27 rt fem CVL >> 11/27 ETT >> Dec 07, 2023   Baltazar Apo, MD, PhD 2017-01-07, 8:22 PM [facility] [phone] or if no answer [phone]
